# Patient Record
Sex: Female | Born: 1960 | Race: White | Hispanic: No | Marital: Married | Smoking: Former smoker
Health system: Southern US, Community
[De-identification: ages and names within clinical notes are randomized; demographics above are authoritative.]

## PROBLEM LIST (undated history)

## (undated) DIAGNOSIS — C4490 Unspecified malignant neoplasm of skin, unspecified: Secondary | ICD-10-CM

## (undated) DIAGNOSIS — E538 Deficiency of other specified B group vitamins: Secondary | ICD-10-CM

## (undated) DIAGNOSIS — B029 Zoster without complications: Secondary | ICD-10-CM

## (undated) DIAGNOSIS — M1990 Unspecified osteoarthritis, unspecified site: Secondary | ICD-10-CM

## (undated) DIAGNOSIS — Z9889 Other specified postprocedural states: Secondary | ICD-10-CM

## (undated) DIAGNOSIS — F419 Anxiety disorder, unspecified: Secondary | ICD-10-CM

## (undated) DIAGNOSIS — M519 Unspecified thoracic, thoracolumbar and lumbosacral intervertebral disc disorder: Secondary | ICD-10-CM

## (undated) DIAGNOSIS — K219 Gastro-esophageal reflux disease without esophagitis: Secondary | ICD-10-CM

## (undated) DIAGNOSIS — M4802 Spinal stenosis, cervical region: Secondary | ICD-10-CM

## (undated) DIAGNOSIS — C801 Malignant (primary) neoplasm, unspecified: Secondary | ICD-10-CM

## (undated) DIAGNOSIS — R112 Nausea with vomiting, unspecified: Secondary | ICD-10-CM

## (undated) DIAGNOSIS — E162 Hypoglycemia, unspecified: Secondary | ICD-10-CM

## (undated) DIAGNOSIS — K589 Irritable bowel syndrome without diarrhea: Secondary | ICD-10-CM

## (undated) DIAGNOSIS — N816 Rectocele: Secondary | ICD-10-CM

## (undated) DIAGNOSIS — Z8249 Family history of ischemic heart disease and other diseases of the circulatory system: Secondary | ICD-10-CM

## (undated) HISTORY — DX: Unspecified osteoarthritis, unspecified site: M19.90

## (undated) HISTORY — PX: DILATION AND CURETTAGE OF UTERUS: SHX78

## (undated) HISTORY — DX: Zoster without complications: B02.9

## (undated) HISTORY — DX: Spinal stenosis, cervical region: M48.02

## (undated) HISTORY — DX: Rectocele: N81.6

## (undated) HISTORY — PX: NASAL SINUS SURGERY: SHX719

## (undated) HISTORY — DX: Gastro-esophageal reflux disease without esophagitis: K21.9

## (undated) HISTORY — DX: Unspecified thoracic, thoracolumbar and lumbosacral intervertebral disc disorder: M51.9

## (undated) HISTORY — DX: Irritable bowel syndrome, unspecified: K58.9

## (undated) HISTORY — DX: Malignant (primary) neoplasm, unspecified: C80.1

## (undated) HISTORY — DX: Unspecified malignant neoplasm of skin, unspecified: C44.90

## (undated) HISTORY — DX: Family history of ischemic heart disease and other diseases of the circulatory system: Z82.49

## (undated) HISTORY — DX: Anxiety disorder, unspecified: F41.9

## (undated) HISTORY — PX: KNEE ARTHROSCOPY: SUR90

## (undated) HISTORY — PX: SHOULDER SURGERY: SHX246

## (undated) HISTORY — DX: Deficiency of other specified B group vitamins: E53.8

## (undated) HISTORY — PX: TUBAL LIGATION: SHX77

## (undated) HISTORY — PX: OTHER SURGICAL HISTORY: SHX169

---

## 1978-11-22 HISTORY — PX: TONSILLECTOMY: SUR1361

## 1990-11-22 HISTORY — PX: TOTAL LAPAROSCOPIC HYSTERECTOMY WITH BILATERAL SALPINGO OOPHORECTOMY: SHX6845

## 1990-11-22 HISTORY — PX: ABDOMINAL HYSTERECTOMY: SHX81

## 1990-11-22 HISTORY — PX: LAPAROSCOPIC HYSTERECTOMY: SHX1926

## 2002-11-22 HISTORY — PX: CHOLECYSTECTOMY: SHX55

## 2003-07-08 ENCOUNTER — Ambulatory Visit (HOSPITAL_COMMUNITY): Admission: RE | Admit: 2003-07-08 | Discharge: 2003-07-08 | Payer: Self-pay | Admitting: Internal Medicine

## 2003-07-08 ENCOUNTER — Encounter: Payer: Self-pay | Admitting: Internal Medicine

## 2003-07-08 HISTORY — PX: COLONOSCOPY: SHX174

## 2003-07-19 ENCOUNTER — Observation Stay (HOSPITAL_COMMUNITY): Admission: RE | Admit: 2003-07-19 | Discharge: 2003-07-20 | Payer: Self-pay | Admitting: General Surgery

## 2003-08-30 ENCOUNTER — Emergency Department (HOSPITAL_COMMUNITY): Admission: EM | Admit: 2003-08-30 | Discharge: 2003-08-31 | Payer: Self-pay | Admitting: Internal Medicine

## 2004-08-08 ENCOUNTER — Emergency Department (HOSPITAL_COMMUNITY): Admission: EM | Admit: 2004-08-08 | Discharge: 2004-08-08 | Payer: Self-pay

## 2004-11-06 ENCOUNTER — Ambulatory Visit (HOSPITAL_COMMUNITY): Admission: RE | Admit: 2004-11-06 | Discharge: 2004-11-06 | Payer: Self-pay | Admitting: Family Medicine

## 2005-08-30 ENCOUNTER — Emergency Department (HOSPITAL_COMMUNITY): Admission: EM | Admit: 2005-08-30 | Discharge: 2005-08-30 | Payer: Self-pay | Admitting: Emergency Medicine

## 2006-10-26 ENCOUNTER — Ambulatory Visit (HOSPITAL_COMMUNITY): Admission: RE | Admit: 2006-10-26 | Discharge: 2006-10-26 | Payer: Self-pay | Admitting: Obstetrics and Gynecology

## 2007-06-07 ENCOUNTER — Ambulatory Visit (HOSPITAL_COMMUNITY): Admission: RE | Admit: 2007-06-07 | Discharge: 2007-06-07 | Payer: Self-pay | Admitting: Family Medicine

## 2007-11-23 HISTORY — PX: CERVICAL SPINE SURGERY: SHX589

## 2008-02-12 ENCOUNTER — Ambulatory Visit (HOSPITAL_COMMUNITY): Admission: RE | Admit: 2008-02-12 | Discharge: 2008-02-12 | Payer: Self-pay | Admitting: Obstetrics and Gynecology

## 2008-04-01 ENCOUNTER — Other Ambulatory Visit: Admission: RE | Admit: 2008-04-01 | Discharge: 2008-04-01 | Payer: Self-pay | Admitting: Obstetrics and Gynecology

## 2008-04-24 ENCOUNTER — Ambulatory Visit (HOSPITAL_COMMUNITY): Admission: RE | Admit: 2008-04-24 | Discharge: 2008-04-24 | Payer: Self-pay | Admitting: Unknown Physician Specialty

## 2008-06-10 ENCOUNTER — Ambulatory Visit (HOSPITAL_COMMUNITY): Admission: RE | Admit: 2008-06-10 | Discharge: 2008-06-11 | Payer: Self-pay | Admitting: Neurosurgery

## 2008-07-15 ENCOUNTER — Ambulatory Visit: Payer: Self-pay | Admitting: Internal Medicine

## 2008-07-15 DIAGNOSIS — G4700 Insomnia, unspecified: Secondary | ICD-10-CM

## 2008-07-15 DIAGNOSIS — I83891 Varicose veins of right lower extremities with other complications: Secondary | ICD-10-CM

## 2008-07-15 DIAGNOSIS — K5900 Constipation, unspecified: Secondary | ICD-10-CM | POA: Insufficient documentation

## 2008-07-15 DIAGNOSIS — H3530 Unspecified macular degeneration: Secondary | ICD-10-CM | POA: Insufficient documentation

## 2008-07-15 DIAGNOSIS — IMO0001 Reserved for inherently not codable concepts without codable children: Secondary | ICD-10-CM

## 2008-07-15 DIAGNOSIS — M129 Arthropathy, unspecified: Secondary | ICD-10-CM | POA: Insufficient documentation

## 2008-07-15 DIAGNOSIS — M545 Low back pain: Secondary | ICD-10-CM

## 2008-07-15 DIAGNOSIS — Z87898 Personal history of other specified conditions: Secondary | ICD-10-CM

## 2008-07-15 DIAGNOSIS — N318 Other neuromuscular dysfunction of bladder: Secondary | ICD-10-CM

## 2008-07-15 DIAGNOSIS — E162 Hypoglycemia, unspecified: Secondary | ICD-10-CM | POA: Insufficient documentation

## 2008-07-15 DIAGNOSIS — N3946 Mixed incontinence: Secondary | ICD-10-CM | POA: Insufficient documentation

## 2008-07-15 DIAGNOSIS — K589 Irritable bowel syndrome without diarrhea: Secondary | ICD-10-CM

## 2008-07-15 DIAGNOSIS — J309 Allergic rhinitis, unspecified: Secondary | ICD-10-CM | POA: Insufficient documentation

## 2008-08-09 ENCOUNTER — Ambulatory Visit: Payer: Self-pay | Admitting: Internal Medicine

## 2008-08-09 DIAGNOSIS — J069 Acute upper respiratory infection, unspecified: Secondary | ICD-10-CM | POA: Insufficient documentation

## 2008-08-09 LAB — CONVERTED CEMR LAB: Inflenza A Ag: NEGATIVE

## 2008-08-29 ENCOUNTER — Encounter (INDEPENDENT_AMBULATORY_CARE_PROVIDER_SITE_OTHER): Payer: Self-pay | Admitting: Internal Medicine

## 2008-09-25 ENCOUNTER — Encounter (INDEPENDENT_AMBULATORY_CARE_PROVIDER_SITE_OTHER): Payer: Self-pay | Admitting: Internal Medicine

## 2008-10-25 ENCOUNTER — Ambulatory Visit (HOSPITAL_COMMUNITY): Admission: RE | Admit: 2008-10-25 | Discharge: 2008-10-25 | Payer: Self-pay | Admitting: Neurosurgery

## 2008-10-25 ENCOUNTER — Encounter (INDEPENDENT_AMBULATORY_CARE_PROVIDER_SITE_OTHER): Payer: Self-pay | Admitting: Internal Medicine

## 2008-11-08 ENCOUNTER — Encounter (INDEPENDENT_AMBULATORY_CARE_PROVIDER_SITE_OTHER): Payer: Self-pay | Admitting: Internal Medicine

## 2008-12-06 ENCOUNTER — Ambulatory Visit: Payer: Self-pay | Admitting: Internal Medicine

## 2008-12-06 DIAGNOSIS — R5381 Other malaise: Secondary | ICD-10-CM | POA: Insufficient documentation

## 2008-12-06 DIAGNOSIS — M542 Cervicalgia: Secondary | ICD-10-CM

## 2008-12-06 DIAGNOSIS — R5383 Other fatigue: Secondary | ICD-10-CM

## 2008-12-20 ENCOUNTER — Ambulatory Visit: Payer: Self-pay | Admitting: Internal Medicine

## 2008-12-20 DIAGNOSIS — G5600 Carpal tunnel syndrome, unspecified upper limb: Secondary | ICD-10-CM

## 2009-01-22 ENCOUNTER — Encounter
Admission: RE | Admit: 2009-01-22 | Discharge: 2009-04-22 | Payer: Self-pay | Admitting: Physical Medicine and Rehabilitation

## 2009-01-24 ENCOUNTER — Ambulatory Visit: Payer: Self-pay | Admitting: Physical Medicine and Rehabilitation

## 2009-01-29 ENCOUNTER — Encounter (HOSPITAL_COMMUNITY)
Admission: RE | Admit: 2009-01-29 | Discharge: 2009-02-28 | Payer: Self-pay | Admitting: Physical Medicine and Rehabilitation

## 2009-02-14 ENCOUNTER — Encounter (INDEPENDENT_AMBULATORY_CARE_PROVIDER_SITE_OTHER): Payer: Self-pay | Admitting: Internal Medicine

## 2009-02-14 ENCOUNTER — Other Ambulatory Visit: Admission: RE | Admit: 2009-02-14 | Discharge: 2009-02-14 | Payer: Self-pay | Admitting: Internal Medicine

## 2009-02-14 ENCOUNTER — Ambulatory Visit: Payer: Self-pay | Admitting: Internal Medicine

## 2009-02-21 ENCOUNTER — Ambulatory Visit (HOSPITAL_COMMUNITY): Admission: RE | Admit: 2009-02-21 | Discharge: 2009-02-21 | Payer: Self-pay

## 2009-02-28 ENCOUNTER — Ambulatory Visit: Payer: Self-pay | Admitting: Physical Medicine and Rehabilitation

## 2009-03-01 ENCOUNTER — Ambulatory Visit (HOSPITAL_COMMUNITY)
Admission: RE | Admit: 2009-03-01 | Discharge: 2009-03-01 | Payer: Self-pay | Admitting: Physical Medicine and Rehabilitation

## 2009-03-04 ENCOUNTER — Ambulatory Visit: Payer: Self-pay | Admitting: Physical Medicine & Rehabilitation

## 2009-03-04 ENCOUNTER — Encounter
Admission: RE | Admit: 2009-03-04 | Discharge: 2009-03-04 | Payer: Self-pay | Admitting: Physical Medicine & Rehabilitation

## 2009-03-07 ENCOUNTER — Encounter (INDEPENDENT_AMBULATORY_CARE_PROVIDER_SITE_OTHER): Payer: Self-pay | Admitting: Internal Medicine

## 2009-03-28 ENCOUNTER — Ambulatory Visit: Payer: Self-pay | Admitting: Physical Medicine and Rehabilitation

## 2009-04-04 ENCOUNTER — Ambulatory Visit (HOSPITAL_COMMUNITY): Admission: RE | Admit: 2009-04-04 | Discharge: 2009-04-04 | Payer: Self-pay | Admitting: Neurosurgery

## 2009-04-23 ENCOUNTER — Ambulatory Visit: Payer: Self-pay | Admitting: Internal Medicine

## 2009-06-04 ENCOUNTER — Encounter
Admission: RE | Admit: 2009-06-04 | Discharge: 2009-06-04 | Payer: Self-pay | Admitting: Physical Medicine and Rehabilitation

## 2009-06-09 ENCOUNTER — Emergency Department (HOSPITAL_COMMUNITY): Admission: EM | Admit: 2009-06-09 | Discharge: 2009-06-09 | Payer: Self-pay | Admitting: Emergency Medicine

## 2009-06-11 ENCOUNTER — Ambulatory Visit: Payer: Self-pay | Admitting: Internal Medicine

## 2009-06-11 DIAGNOSIS — I739 Peripheral vascular disease, unspecified: Secondary | ICD-10-CM | POA: Insufficient documentation

## 2009-06-12 ENCOUNTER — Encounter (INDEPENDENT_AMBULATORY_CARE_PROVIDER_SITE_OTHER): Payer: Self-pay | Admitting: Internal Medicine

## 2009-06-12 LAB — CONVERTED CEMR LAB
ALT: 31 units/L (ref 0–35)
AST: 25 units/L (ref 0–37)
Albumin: 4.5 g/dL (ref 3.5–5.2)
Alkaline Phosphatase: 72 units/L (ref 39–117)
Anti Nuclear Antibody(ANA): NEGATIVE
BUN: 9 mg/dL (ref 6–23)
Basophils Absolute: 0 10*3/uL (ref 0.0–0.1)
Basophils Relative: 1 % (ref 0–1)
CO2: 24 meq/L (ref 19–32)
CRP: 0.1 mg/dL (ref <0.6)
Calcium: 9.5 mg/dL (ref 8.4–10.5)
Chloride: 107 meq/L (ref 96–112)
Cholesterol: 195 mg/dL (ref 0–200)
Creatinine, Ser: 0.91 mg/dL (ref 0.40–1.20)
Eosinophils Absolute: 0.1 10*3/uL (ref 0.0–0.7)
Eosinophils Relative: 2 % (ref 0–5)
Glucose, Bld: 87 mg/dL (ref 70–99)
HCT: 40.1 % (ref 36.0–46.0)
HDL: 60 mg/dL (ref >39)
Hemoglobin: 14 g/dL (ref 12.0–15.0)
LDL Cholesterol: 87 mg/dL (ref 0–99)
Lymphocytes Relative: 39 % (ref 12–46)
Lymphs Abs: 1.9 10*3/uL (ref 0.7–4.0)
MCHC: 34.9 g/dL (ref 30.0–36.0)
MCV: 92.2 fL (ref 78.0–100.0)
Monocytes Absolute: 0.5 10*3/uL (ref 0.1–1.0)
Monocytes Relative: 9 % (ref 3–12)
Neutro Abs: 2.5 10*3/uL (ref 1.7–7.7)
Neutrophils Relative %: 50 % (ref 43–77)
Platelets: 240 10*3/uL (ref 150–400)
Potassium: 4 meq/L (ref 3.5–5.3)
RBC: 4.35 M/uL (ref 3.87–5.11)
RDW: 13 % (ref 11.5–15.5)
Sed Rate: 3 mm/hr (ref 0–22)
Sodium: 142 meq/L (ref 135–145)
Total Bilirubin: 0.4 mg/dL (ref 0.3–1.2)
Total CHOL/HDL Ratio: 3.3
Total Protein: 6.8 g/dL (ref 6.0–8.3)
Triglycerides: 238 mg/dL — ABNORMAL HIGH (ref <150)
VLDL: 48 mg/dL — ABNORMAL HIGH (ref 0–40)
WBC: 4.9 10*3/uL (ref 4.0–10.5)

## 2009-06-20 ENCOUNTER — Telehealth (INDEPENDENT_AMBULATORY_CARE_PROVIDER_SITE_OTHER): Payer: Self-pay | Admitting: Internal Medicine

## 2009-06-20 ENCOUNTER — Encounter: Admission: RE | Admit: 2009-06-20 | Discharge: 2009-06-20 | Payer: Self-pay | Admitting: Internal Medicine

## 2009-06-23 ENCOUNTER — Encounter (INDEPENDENT_AMBULATORY_CARE_PROVIDER_SITE_OTHER): Payer: Self-pay | Admitting: Internal Medicine

## 2009-06-30 ENCOUNTER — Encounter (INDEPENDENT_AMBULATORY_CARE_PROVIDER_SITE_OTHER): Payer: Self-pay | Admitting: Internal Medicine

## 2009-07-18 ENCOUNTER — Encounter (INDEPENDENT_AMBULATORY_CARE_PROVIDER_SITE_OTHER): Payer: Self-pay | Admitting: Internal Medicine

## 2009-08-06 ENCOUNTER — Encounter (INDEPENDENT_AMBULATORY_CARE_PROVIDER_SITE_OTHER): Payer: Self-pay | Admitting: Internal Medicine

## 2009-09-26 ENCOUNTER — Ambulatory Visit (HOSPITAL_COMMUNITY): Admission: RE | Admit: 2009-09-26 | Discharge: 2009-09-26 | Payer: Self-pay | Admitting: Family Medicine

## 2010-04-03 ENCOUNTER — Ambulatory Visit (HOSPITAL_COMMUNITY): Admission: RE | Admit: 2010-04-03 | Discharge: 2010-04-03 | Payer: Self-pay | Admitting: Family Medicine

## 2010-06-15 ENCOUNTER — Ambulatory Visit (HOSPITAL_COMMUNITY): Admission: RE | Admit: 2010-06-15 | Discharge: 2010-06-15 | Payer: Self-pay | Admitting: Urology

## 2010-12-13 ENCOUNTER — Encounter: Payer: Self-pay | Admitting: Family Medicine

## 2010-12-14 ENCOUNTER — Encounter: Payer: Self-pay | Admitting: Internal Medicine

## 2010-12-20 LAB — CONVERTED CEMR LAB
ALT: 15 units/L (ref 0–35)
AST: 16 units/L (ref 0–37)
Albumin: 4.5 g/dL (ref 3.5–5.2)
Alkaline Phosphatase: 63 units/L (ref 39–117)
BUN: 13 mg/dL (ref 6–23)
Basophils Absolute: 0 10*3/uL (ref 0.0–0.1)
Basophils Relative: 1 % (ref 0–1)
CO2: 21 meq/L (ref 19–32)
Calcium: 9.4 mg/dL (ref 8.4–10.5)
Chloride: 107 meq/L (ref 96–112)
Cholesterol: 188 mg/dL (ref 0–200)
Creatinine, Ser: 0.75 mg/dL (ref 0.40–1.20)
Eosinophils Absolute: 0.2 10*3/uL (ref 0.0–0.7)
Eosinophils Relative: 4 % (ref 0–5)
Free T4: 0.94 ng/dL (ref 0.89–1.80)
Glucose, Bld: 82 mg/dL (ref 70–99)
HCT: 42.2 % (ref 36.0–46.0)
HDL: 66 mg/dL (ref >39)
Hemoglobin: 14.3 g/dL (ref 12.0–15.0)
LDL Cholesterol: 108 mg/dL — ABNORMAL HIGH (ref 0–99)
Lymphocytes Relative: 33 % (ref 12–46)
Lymphs Abs: 1.7 10*3/uL (ref 0.7–4.0)
MCHC: 33.9 g/dL (ref 30.0–36.0)
MCV: 92.5 fL (ref 78.0–100.0)
Monocytes Absolute: 0.4 10*3/uL (ref 0.1–1.0)
Monocytes Relative: 7 % (ref 3–12)
Neutro Abs: 2.9 10*3/uL (ref 1.7–7.7)
Neutrophils Relative %: 56 % (ref 43–77)
Platelets: 233 10*3/uL (ref 150–400)
Potassium: 4.2 meq/L (ref 3.5–5.3)
RBC: 4.56 M/uL (ref 3.87–5.11)
RDW: 13 % (ref 11.5–15.5)
Sodium: 142 meq/L (ref 135–145)
TSH: 1.382 microintl units/mL (ref 0.350–4.50)
Total Bilirubin: 0.7 mg/dL (ref 0.3–1.2)
Total CHOL/HDL Ratio: 2.8
Total Protein: 7 g/dL (ref 6.0–8.3)
Triglycerides: 69 mg/dL (ref <150)
VLDL: 14 mg/dL (ref 0–40)
WBC: 5.1 10*3/uL (ref 4.0–10.5)

## 2011-04-06 NOTE — Group Therapy Note (Signed)
Natasha Wright is a pleasant 50 year old married woman who works 40-60  hours a week who has kindly been referred by Dr. Jen Mow.  Natasha Wright states  that she has multiple pain problems, which include neck pain, right hand  pain, right lateral hip pain, bilateral leg pain.   She states that she has also been diagnosed with fibromyalgia back in  2005.   She underwent a C5-C6 and C6-C7 extensive anterior cervical diskectomy  and decompression by Dr. Tressie Stalker on 05/2008.  And he she  continues to follow her as well.   With respect to her pain, she indicates her average pain is about 3 on a  scale of 10.  She describes her pain is intermittent.  Her biggest  problem is in the morning when she first gets up.  She stayed active  throughout the day and she is a little better, but driving exacerbates  her neck pain as well as her right hand pain.   Sleep tends to be poor; however, she does find that she sleeps much  better with trazodone.   Pain is typically worse with prolonged sitting, standing, inactivity,  improves with rest, medications, TENS unit, and injections.  She reports  a little-to-fair relief with current medications.   Medications which she takes at this time include trazodone 50 mg at  bedtime., estradiol, etodolac 400 mg 1-2 a day, and Tylenol 600 mg 2  tablets b.i.d.   FUNCTIONAL STATUS:  She is able to walk without assistance.  She can  walk at least 30 minutes at a time.  She climb stairs.  She is able to  drive.  She states that she puts on about 3000 miles per month on her  car.  She works 40-60 hours a week.  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   She is independent with self-care, overall, very high functioning  individual.   Denies problems controlling bowel or bladder, admits to occasional  numbness and tingling in the upper extremities, spasms, and occasional  dizziness.  Denies depression, anxiety.  Denies  suicidal ideation.   REVIEW OF SYSTEMS:  Also positive for occasional night sweats, easy  bleeding, and diarrhea.  She maintain contact with Dr. Jen Mow for her  primary care needs.   PAST MEDICAL HISTORY:  Positive for history of gallbladder problems  status post cholecystectomy, tonsillectomy, hysterectomy, septoplasty,  and cervical spine surgery, Dr. Lovell Sheehan C5-C6, C6-C7 fusion on June 18, 2008.   SOCIAL HISTORY:  The patient is married, lives with her husband and her  son who has Down syndrome 34 year old.  She denies legal substance use.  Denies alcohol use and she admits to smoking half pack of cigarettes a  day.  She was counseled to quit.   FAMILY HISTORY:  Positive for heart disease, diabetes, high blood  pressure.  Mother with bipolar disorder and sister with bipolar  disorder.   ALLERGIES:  MORPHINE causes itching and nausea, CODEINE causes itching  and nausea, SULFA rash and dyspnea, PENICILLIN rash and dyspnea, did not  tolerate FENTANYL, apparently dose too high.   PHYSICAL EXAMINATION:  Blood pressure is 130/71, pulse 84, respirations  18, 100% saturated on room air.  She is a well-developed, well-nourished  woman who does not appear in any distress.  She is oriented x3.  Speech  is clear.  Affect is bright.  She is alert, cooperative, and pleasant.  She follows commands  without difficulty.   Cranial nerves II-XII are grossly intact.  Coordination is intact.  Reflexes are 1+ in the upper extremities and symmetric 2+ in the lower  extremities, symmetric.  No abnormal tone is noted.  No clonus is noted.  No tremors are appreciated.  She has intact sensation to light touch,  pinprick, and vibratory sensation.   Motor strength is 5/5 in the upper extremities without focal deficits,  5/5 at hip flexors, knee extensors, dorsiflexors, and plantar flexors.  No focal deficits appreciated.   Transitioning from sitting to standing is done with ease.  Gait is not  antalgic.   Tandem gait and Romberg test are performed adequately.   She has limitations in cervical range of motion in all planes, rotation  to the left is 45 degrees, to the right is 60 degrees.  Forward flexion,  extension, and lateral flexion are all limited.  She has full shoulder  range of motion.  Right hand is tender at the carpometacarpal joint in  the right thumb.  She has a thoracolumbar curve at the apex of the  lumbosacral junction convexed of the right.  Range of motion is mildly  limited in all planes.  She has tenderness over the right trochanter  down the iliotibial band mildly so on the left as well.  Maisie Fus test is  negative.   Valgus deformity noted bilaterally at the metatarsophalangeal joints  bilaterally, tenderness in the cervical paraspinal as well as  parascapular muscles in the lumbar paraspinal musculature noted.   IMPRESSION:  1. Cervicalgia status post C5-C6, C6-C7 fusion Dr. Lovell Sheehan August 11, 2008.  2. Lumbago with history of mild scoliosis apex L2 convexed to the      right.  3. Right thumb carpometacarpal tenderness.  4. Right trochanteric bursitis.  5. History of fibromyalgia.  6. Occasional right hand numbness and tingling without obvious      deficits on exam today.   PLAN:  1. We will give her a prescription for a right hand orthosis to      stabilize right thumb carpometacarpal joint.  2. X-rays right hand.  3. EMG nerve conduction studies right bilateral upper extremities.  4. Physical therapy to address lower extremity strength and mobility      deficits addressing right trochanteric bursitis specifically      ultrasound along the IT band.  5. Trialed Lyrica 25 mg one p.o. q.p.m. for 7 days and 2 p.o. q.p.m.      for 7 days.   Risks and benefits of Lyrica reviewed with her.  She would like to trial  it.  She has been on it before, but believes the dose has been much  higher, and at that time, she did not tolerate the high dose, as she   states that it put her in a bad mood.  We will see her back in a month.           ______________________________  Brantley Stage, M.D.     DMK/MedQ  D:  01/24/2009 11:57:05  T:  01/25/2009 02:38:53  Job #:  [id number]   cc:   Erle Crocker, M.D.

## 2011-04-06 NOTE — Op Note (Signed)
NAME:  Natasha Wright, Natasha Wright                 ACCOUNT NO.:  [id number]   MEDICAL RECORD NO.:  [id number]          PATIENT TYPE:  OIB   LOCATION:  3524                         FACILITY:  [facility]   PHYSICIAN:  Cristi Loron, M.D.DATE OF BIRTH:  Mar 15, 1961   DATE OF PROCEDURE:  06/10/2008  DATE OF DISCHARGE:  06/11/2008                               OPERATIVE REPORT   BRIEF HISTORY:  The patient is a 50 year old white female who suffered  from neck and right arm pain, consistent with the cervical  radiculopathy.  She failed medical management, was worked up with  cervical MRI which demonstrated the patient had spondylosis and  stenosis, etc. at C5-C6 and C6-C7.  I discussed the various treatment  option with her including surgery.  The patient has weighed the risks,  benefits, and alternatives of surgery; and opted to proceed with the C5-  C6 and C6-C7 anterior cervical diskectomy, fusion, and plating.   PREOPERATIVE DIAGNOSES:  1. C5-C6 and C6-C7 herniated nucleus pulposus.  2. Spondylosis.  3. Stenosis.  4. Disk degeneration.  5. Cervical radiculopathy.  6. Status post myelopathy.  7. Cervicalgia.   POSTOPERATIVE DIAGNOSES:  1. C5-C6 and C6-C7 herniated nucleus pulposus.  2. Spondylosis.  3. Stenosis.  4. Disk degeneration.  5. Cervical radiculopathy.  6. Status post myelopathy.  7. Cervicalgia.   PROCEDURE:  C5-C6 and C6-C7 extensive anterior cervical  diskectomy/decompression; C5-C6 and C6-C7 anterior interbody arthrodesis  with local morselized autograft bone, as well as Actifuse bone graft  extenders; insertion of C5-C6 and C6-C7 interbody prosthesis (Alphatec  PEEK interbody prosthesis); anterior cervical plating C5-C7 with Codman  SLIM-LOC titanium plate and screws.   SURGEON:  Tressie Stalker, MD   ASSISTANT:  Hilda Lias, MD   ANESTHESIA:  General endotracheal.   ESTIMATED BLOOD LOSS:  100 mL.   SPECIMENS:  None.   DRAINS:  None.   COMPLICATIONS:  None.   DESCRIPTION OF PROCEDURE:  The patient was brought to the operating room  by the Anesthesia Team.  General endotracheal anesthesia was induced.  The patient remained in supine position.  A roll was placed under her  shoulders to place her neck in slight extension.  Her anterior cervical  region was then prepared with Betadine scrub and Betadine solution.  Sterile drapes were applied.  I then injected the area to be incised  with Marcaine and with epinephrine solution.  We then used a scalpel to  make a transverse incision in the patient's left anterior neck using  Metzenbaum scissors to divide the platysma muscle, and then dissect the  medial to sternocleidomastoid muscle, jugular vein, and carotid artery.   We carefully dissected down towards the anterior cervical spine  identifying the esophagus and retracted medially.  We then used Kitner  swabs to clear the soft tissue from the anterior cervical spine, and  then we inserted a bent spinal needle exposing the intervertebral disk  space.  We obtained intraoperative radiograph to confirm our location.   We then used electrocautery to detach the medial border of the longus  colli muscle bilaterally from the C5-C6  and C6-C7 intervertebral disk  space.  We inserted the Caspar self-retaining retractor underneath the  longus colli muscle bilaterally for providing exposure.  We then began  the decompression at C5-C6.  We incised C5-C6 intervertebral disk with a  15-blade scalpel.  The disk space was quite spondylotic and collapsed.  We performed partial discectomy using the pituitary forceps.  We then  inserted distraction screws at the C5-C6 vertebral bodies and the  distracted interspace.  We then used a high-speed drill to decorticate  the vertebral endplates at C5-C6, drilled away the remainder of the C5-  C6 intervertebral disks, drilled away some posterior spondylosis, and to  thin out the posterior longitudinal ligament.  We then incised  the  ligament with the arachnoid knife, and then removed it with a Kerrison  punch undercutting the vertebral endplates decompressing the thecal sac.  We then performed a foraminotomy about the C6 nerve root completing the  decompression at this level.   We then repeated this procedure in an analogous fashion at C6-C7,  decompressing the C6-C7, thecal sac and the bilateral C7 nerve root.   Having completed the decompression, we now turned our attention to the  arthrodesis.  We used trial spacers, and determined to use 5 mm medium  PEEK interbody prosthesis.  We prefilled these prosthesis with a  combination of local autograft bone obtained during the decompression,  as well as Actifuse bone-graft extender.  We then inserted a prosthesis,  distracted interspaces at C5-C6 and C6-C7, and then removed the  distraction screws.  There was a good snug fit of prosthesis at both  levels.  This completed the arthrodesis.   We now turned our attention to anterior spinal instrumentation using  high-speed drill to remove the ventral spondylosis at C5-C6 and C6-C7 so  that the plate would lay down flat.  We selected appropriate length  Codman SLIM-LOC anterior cervical plate and laid it along the anterior  aspect of the vertebral bodies using from C5-C7.  I then drilled two 12  mm holes at C5, C6, and C7 secured the plate to the vertebral bodies by  placing  two 12-mm self-tapping screws at C5, C6, and C7.  We then  obtained intraoperative radiograph.  It demonstrated good position of  the plate, screws and interbody prosthesis.  We therefore secured these  screws and plate by locking each cam.  We then irrigated the wound out  with bacitracin solution.  We obtained stringent hemostasis using  bipolar electrocautery.   We then removed the retractor.  We inspected the esophagus for any  damage and there was none apparent.  We then reapproximated the  patient's platysma muscle with interrupted 3-0  Vicryl suture,  subcutaneous tissue with interrupted 3-0 Vicryl suture, and the skin  with Steri-Strips and Benzoin.  The wound was then coated with  bacitracin ointment and sterile dressing applied.  The drapes were  removed.  The patient was subsequently extubated by the Anesthesia Team  and transported to the Postanesthesia Care Unit in stable condition.  All sponge, instrument, and needle counts were correct at the end of  this case.Cristi Loron, M.D.  Electronically Signed     JDJ/MEDQ  D:  06/10/2008  T:  06/11/2008  Job:  [id number]

## 2011-04-06 NOTE — Assessment & Plan Note (Signed)
[name] is a pleasant 50 year old high functioning married woman  who works for 40-60 hours a week.  She has been referred by Dr.  Erle Wright.  [name] has multiple pain complaints, which include  the following; a history of fibromyalgia dating back to 2005, chronic  neck pain, status post C5-6, C6-7 anterior cervical diskectomy  decompression, Tressie Stalker on July 2009, right lateral hip pain,  right hand pain, and left foot pain.   Last month, she was seen for the first time on January 24, 2009, and at  that time she was written out for a prescription.  She was given a  prescription for right hand orthosis to stabilize her right thumb  carpometacarpal joints.  She states that this has helped and she has  less pain and swelling in this hand.  She has electrodiagnostic studies  pending next week.   She also comments that she has been having difficulty extending her  fingers on the right hand completely.  She states that this has been a  problem, which was worse prior to surgery, but it still continues to be  somewhat of a problem for her.  She states that she has difficulty with  holding objects, she sometimes drop things, and feels that she does have  some hand weakness as well as pain in the area of the right thumb.   She was started on physical therapy to address her right trochanteric  bursitis and iliotibial band syndrome.  She reports overall improvement  in this region as well.  She has had a couple of physical therapy visits  and is doing a home program.  She states that the therapist are  interested in doing some ultrasound and would like to continue therapy  with her.   She did not get x-rays of her right hand last month as ordered, but she  would like to follow through this month.   She was started on Lyrica last month and has found that she is sleeping  much better and seems to help various fibromyalgic symptoms as well.   Her average pain in these various  areas is about 3 on a scale of 10.  Her sleep is fair.  Pain is worse with inactivity and improves with  rest, medication, and TENS unit.  She gets fair relief with current  medications.   FUNCTIONAL STATUS:  She is able to walk about 30 minutes at a time.  She  is able to climb stairs and drive.  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   REVIEW OF SYSTEMS:  Positive for numbness, spasms, and occasional  dizziness.  Denies bowel or bladder control problems, trouble walking,  or suicidal ideation.  Review of systems regarding constitutional, GI,  GU or cardiorespiratory are all negative.   Past medical, social, and family history are unchanged from previous  visit.   MEDICATIONS:  Trazodone and estradiol.  This is per Dr. Erle Wright  and she was started on some Lyrica through our clinic 25 mg initially 1  tablet nightly titrating to 2 tablets nightly.   PHYSICAL EXAMINATION:  VITAL SIGNS:  Today, blood pressure is 141/71,  pulse 90, respiration 18, and 100% saturated on room air.  GENERAL:  She is well-developed, well-nourished woman, who does not  appear in any distress.  NEUROLOGIC:  She is oriented x3.  Speech is clear.  Affect is bright.  She is alert, cooperative, and pleasant.  Follows  commands without  difficulty and answers questions appropriately.  Cranial nerves are  grossly intact.  She has 2+ reflexes at bilateral biceps, triceps,  brachioradialis, and 2+ at the patellar and Achilles tendons as well.  No abnormal tone is noted.  No clonus is noted.  No tremors are  appreciated.  EXTREMITIES:  She is noted to have a deficiency in extension of the  right proximal and distal phalanges.  She is able to extend them  volitionally.  She does have some weakness with these extensors,  however.  Finger flexors are slightly weaker on the right as well as  slight weakness in  right triceps is appreciated.  Rest of exam is  unremarkable.  She has full strength in left  upper extremity as well as  bilateral lower extremities.   Limitations in cervical range of motion are noted.  She has 45 degrees  of rotation to the left, about 60 degrees to the right.  Limitations in  extension are appreciated as well.  Full shoulder range of motion is  noted.  Lumbar flexion, provoke some right low back pain.  She has some  tenderness over the right trochanter and down the iliotibial band.  Left  MTP joints are tender, 1 through 5 with palpation.  She has a valgus  deformity at the first left MTP joint as well.  She has tenderness over  the right carpal and metacarpal joint on the right.   IMPRESSION:  1. Cervicalgia, status post C5-6, C6-7 anterior cervical diskectomy      decompression by Dr. Lovell Sheehan on July 2009.  2. Several year history of fibromyalgia.  3. 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  4. Left metatarsophalangeal tenderness with the first      metatarsophalangeal valgus deformity.  5. Right trochanteric bursitis and iliotibial band syndrome.  6. History of right upper extremity, numbness, tingling, weakness with      weakness noted in hand flexors as well as extensors.   PLAN:  1. Electrodiagnostic studies next week to include right upper      extremity EMG, nerve conduction studies to rule out carpal tunnel      and cervical radiculopathy, acute versus chronic.  2. Continue physical therapy to include ultrasound to the right      iliotibial band in trochanteric region on the right hip.  3. Right hand x-rays ordered last month were not completed.  We will      reorder again this month.  4. Discussion regarding the proper shoe wear for right foot MTP joint      tenderness that would recommend less flexible shoe wear, a more      supportive shoe for this foot.  She states she plans at some point      to have orthopedic evaluation and possibly surgery, but is not      quite ready yet.   The patient has done well with Lyrica.  She is  sleeping better, reports  slight improvement in overall pain.  We will continue this 50 mg at  night.   She has already made an appointment, follow up appointment with Dr.  Lovell Sheehan, status post her surgery last fall, and intends to see him on  March 07, 2009 as planned.  I will see her back in 1 month.  She did  well on the Lyrica without any significant side effects.  This was  refilled for her today and she was given 2 extra refills as well.  ______________________________  Brantley Stage, M.D.     DMK/MedQ  D:  02/28/2009 10:14:41  T:  02/28/2009 23:34:39  Job #:  [id number]   cc:   Natasha Wright, M.D.   Cristi Loron, M.D.  Fax: [phone]

## 2011-04-06 NOTE — Assessment & Plan Note (Signed)
[name] is a 50 year old high functioning married woman who  works 40-60 hours a week.  She was last seen by me on February 28, 2009.  She is a patient of Dr. Erle Crocker and has had cervical spine  surgery in July 2009 by Dr. Tressie Stalker.  At that time, she had a C5-  6 and C6-7 anterior cervical diskectomy.  She had an MRI of her cervical  spine without contrast on October 25, 2008, which showed small central  disk protrusion at C3-4 with facet degeneration, and also at the C4-5.  She had disk degeneration with diffuse uncinate spurring, mild stenosis  at that level was noted as well.   She recently followed up with Dr. Lovell Sheehan for continued neck pain and  upper extremity numbness.  Electrodiagnostic studies were ordered and  were completed by Dr. Wynn Banker on March 04, 2009.  At that time, there  was no electrodiagnostic evidence of median or ulnar neuropathy or of  active cervical radiculopathy.  Caveat noted.  Poor sensory involvement  due to cervical radiculopathy.  We will not have EMG abnormalities.   Results of this study were discussed with her today.   She states she did see Dr. Lovell Sheehan on February 25, 2009, and he had ordered  a cervical myelogram, which is to be completed in the next week or so.   Average pain is about 4 on a scale of 10, predominantly located in the  cervical region and radiating to the shoulders bilaterally.  She also  had some low back pain located on the right.  She is known to have a  right trochanteric bursitis and intermittent thumb pain as well, which  is felt to be joint related with negative hand x-rays recently on March 01, 2009 for significant arthritic findings.   Medications through this clinic include:  1. Lyrica 25 mg 2 tablets at night.  2. She occasionally takes a Tylenol on a p.r.n. basis and uses      trazodone at night to help her rest.   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  She is  independent with self-care including higher level household  tasks.  She is able to walk 60 minutes at a time.  She is able to climb  stairs and drive.   Denies problems controlling bowel or bladder.  Denies depression,  anxiety, or suicidal ideation.   REVIEW OF SYSTEMS:  Otherwise, noncontributory.   No changes in her past medical, social, or family history since last  visit.   PHYSICAL EXAMINATION:  VITAL SIGNS:  Today, blood pressure is 108/64,  pulse 86, respiration 18, and 100% saturation on room air.  GENERAL:  She is well-developed, well-nourished female, who appears her  stated age and does not appear in any distress.  She is oriented x3.  Speech is clear.  Affect is bright.  She is alert, cooperative,  pleasant.  Follows commands without difficulty, answers questions  appropriately.  NEUROLOGIC:  Cranial nerves II through XII are intact.  Coordination is  intact.  Reflexes are 1+ in the upper extremities, biceps, triceps, and  brachioradialis and 1+ in the lower extremities at patellar and Achilles  tendons.  No abnormal tone is noted.  No clonus is noted.   Limitations are noted in cervical range of motion especially with  rotation to the left is about 25 degrees and to the right is about 40  degrees, also limited  in extension and flexion.  She has full shoulder  range of motion.  She has some decreased sensation over the right C5  dermatome and into the right C6 dermatome somewhat as well.  Motor  strength, however, is in the 5/5 range without obvious focal weakness  including shoulder external rotators and shoulder abduction.   Mild limitations noted in lumbar motion.   Gait is normal.  Tandem gait and Romberg test are all performed  adequately.   Examination of her hands reveals tenderness over the bilateral CMC  joints worse on the right than on the left, tender with palpation here.   IMPRESSION:  1. Cervicalgia, status post C5-6 and C6-7 anterior cervical  diskectomy      decompression, Dr. Lovell Sheehan, July 2009.  2. History of fibromyalgia.  3. Right carpometacarpal joint tenderness with negative x-ray at this      time.  4. Right trochanteric bursitis, iliotibial band syndrome per exam last      visit.  5. History of right upper extremity numbness, tingling with negative      EMG for active radiculopathy or evidence of ulnar or median nerve      neuropathy.   PLAN:  She does not need a refill on her Lyrica today.  She has been  tolerating that well.  She takes it in the evening and finds to be quite  helpful.  We will add Ultracet today 1 p.o. q.12 h. q.i.d. #120, no  refills.   Encouraged follow up with Dr. Lovell Sheehan.  Apparently, she has a myelogram  planned next week.   Regarding hand pain, we consider occupational therapy in the upcoming  months for her joint protection techniques.  I will see her back in a  month.           ______________________________  Brantley Stage, M.D.     DMK/MedQ  D:  03/28/2009 11:35:06  T:  03/29/2009 01:19:09  Job #:  [id number]   cc:   Erle Crocker, M.D.   Cristi Loron, M.D.  Fax: [phone]

## 2011-04-09 NOTE — Op Note (Signed)
NAME:  Natasha Wright, Natasha Wright                           ACCOUNT NO.:  [id number]   MEDICAL RECORD NO.:  [id number]                   PATIENT TYPE:  AMB   LOCATION:  RAD                                  FACILITY:  [facility]   PHYSICIAN:  R. Roetta Sessions, M.D.              DATE OF BIRTH:  07-04-1961   DATE OF PROCEDURE:  07/08/2003  DATE OF DISCHARGE:                                 OPERATIVE REPORT   PROCEDURE:  Colonoscopy with ileoscopy.   INDICATION FOR PROCEDURE:  The patient is a 50 year old lady with  intermittent diarrhea punctuated with periods of constipation and occasional  blood per rectum.  Colonoscopy is now being done to further evaluate her  symptoms.  It is not able she has derived a significant improvement taking  one whole Levbid at bedtime, although it has caused her sedation the  following day.  Colonoscopy is now being done to further evaluate her bowel  symptoms.  The approach has been discussed with the patient at length.  The  potential risks, benefits, and alternatives have been reviewed, questions  answered, and she is agreeable.  Please see my dictated office note for more  information.   PROCEDURE NOTE:  O2 saturation, blood pressure, pulse, and respirations were  monitored throughout the entire procedure.   CONSCIOUS SEDATION:  Versed 5 mg IV, Demerol 100 mg IV in divided doses.   INSTRUMENT USED:  Olympus video chip pediatric colonoscopy.   FINDINGS:  Digital rectal exam revealed no abnormalities.   Endoscopic findings:  The prep was adequate.   Rectum:  Examination of the rectal mucosa, including retroflexed view of the  anal verge and en face view of the anal canal, revealed minimal probable  internal hemorrhoids.   Colon:  The colonic mucosa was surveyed from the rectosigmoid junction,  left, transverse, and right colon, to the area of the appendiceal orifice,  ileocecal valve, and cecum.  These structures were well-seen and  photographed.  The patient  had scattered left-sided diverticula.  The  remainder of the colonic mucosa to the cecum appeared normal.  The terminal  ileum was intubated to 10 cm.  This segment of the GI tract appeared  entirely normal.  From this level the scope was slowly withdrawn.  All  previously-mentioned mucosal surfaces were again seen and no other  abnormalities were observed.  The patient tolerated the procedure well and  was reactive in endoscopy.   IMPRESSION:  1. Minimally friable internal hemorrhoids, otherwise normal rectum.  2. Left-sided diverticula, remainder of colonic mucosa and terminal ileum     appeared normal.   I suspect we are dealing with irritable bowel syndrome with intermittent  blood from benign anorectal source (i.e., hemorrhoids).    RECOMMENDATIONS:  1. Hemorrhoid literature given to [name].  2. Decrease Levbid to one-half tablet at bedtime.  3. Diverticulosis literature provided to [name].  4. A  10-day course of Anusol-HC suppositories one per rectum at bedtime.  5. Follow-up appointment with [place] in one month.                                               Jonathon Bellows, M.D.    RMR/MEDQ  D:  07/08/2003  T:  07/08/2003  Job:  [id number]

## 2011-04-09 NOTE — Op Note (Signed)
NAME:  Natasha Wright, Natasha Wright                           ACCOUNT NO.:  [id number]   MEDICAL RECORD NO.:  [id number]                   PATIENT TYPE:  OBV   LOCATION:  A325                                 FACILITY:  [facility]   PHYSICIAN:  Barbaraann Barthel, M.D.              DATE OF BIRTH:  1961-02-05   DATE OF PROCEDURE:  DATE OF DISCHARGE:                                 OPERATIVE REPORT   PREOPERATIVE DIAGNOSIS:  Cholecystitis secondary to cholelithiasis versus  gallbladder polyps.   POSTOPERATIVE DIAGNOSIS:  Cholecystitis secondary to cholelithiasis versus  gallbladder polyps.   PROCEDURE:  Laparoscopic cholecystectomy.   SURGEON:  Marlane Hatcher, M.D.   SPECIMEN:  Gallbladder   NOTE:  This is a 50 year old white female who had at least a 6 months  history of postprandial right upper quadrant pain radiating to her back.  Sonogram revealed what was either embedded stones or polyps within it.  She  was referred to me for laparoscopic cholecystectomy.  We discussed the  procedure in detail with her; discussing complications, not limited to but  including: Bleeding, infection, damage to bile ducts, and perforation of  organs and informed consent was obtained.   GROSS OPERATIVE FINDINGS:  The patient had a very floppy gallbladder, a  small cystic duct which was not cannulated.  The right upper quadrant,  otherwise, appeared to be normal other than she appeared to have a soft  tissue indentation above her liver or at the area of her right costal margin  which maybe just a musculoskeletal deformity. No intra-abdominal masses were  appreciated.  She had moderate adhesions around the gallbladder.  The  gallbladder was not opened so I am not sure whether she had stones or polyps  within it.  Final pathology is pending there.   TECHNIQUE:  The patient was placed in the supine position and after the  adequate administration of general anesthesia by endotracheal intubation her  entire abdomen  was prepped with Betadine solution and draped in the usual  manner.  A Foley catheter was aseptically inserted.  A periumbilical  incision was carried out over the superior aspect of the umbilicus and the  fascia was grasped with a sharp towel clip and elevated and a Veress needle  was inserted and confirmed in position with a saline drop test.   The abdomen was then insufflated to approximately 2.4 cm of CO2.  Then using  the Visiport technique an 11-mm [place] Surgical cannula was placed in the  umbilicus and then under direct vision 3 other cannulas were placed and an  11-mm cannula in the epigastrium and two 5-mm cannulas in right upper  quadrant laterally.   The gallbladder was grasped.  Its adhesions were taken down.  The cystic  duct was clearly visualized and triply silver clipped and divided as was the  cystic artery. The gallbladder was then removed uneventfully using the hook  cautery device from the liver bed.  This was done with minimal oozing and  without problem.  The gallbladder was then retrieve and removed using the  EndoCatch device.  Then, after checking for hemostasis and lightly  cauterizing the liver bed and irrigating the abdomen was then desufflated.  I closed the fascia in the area of the umbilicus with #0 Polysorb sutures;  using 1/2% Sensorcaine in all the incision sites to add to postoperative  comfort.  The wounds were then closed with a stapling device.   Prior to closure, all sponge, needle, and instrument counts were found to be  correct.  Estimated blood loss was minimal.  The patient received 1500 cc of  crystalloids intraoperatively.  No drains were placed.  There were no  complications.                                               Barbaraann Barthel, M.D.    WB/MEDQ  D:  07/19/2003  T:  07/19/2003  Job:  [id number]   cc:   Georgann Housekeeper, M.D.  [address]  [place]  [place] [address]  Fax: [phone]   R. Roetta Sessions, M.D.  [address]  [place]  [place] [address]  Fax: [phone]

## 2011-08-20 LAB — CBC
HCT: 39.7
Platelets: 250
RBC: 4.16
WBC: 5.1

## 2011-09-09 ENCOUNTER — Other Ambulatory Visit (HOSPITAL_COMMUNITY): Payer: Self-pay | Admitting: Internal Medicine

## 2011-09-09 DIAGNOSIS — Z139 Encounter for screening, unspecified: Secondary | ICD-10-CM

## 2011-09-13 ENCOUNTER — Ambulatory Visit (HOSPITAL_COMMUNITY)
Admission: RE | Admit: 2011-09-13 | Discharge: 2011-09-13 | Disposition: A | Discharge status: Home / Self Care | Payer: Managed Care, Other (non HMO) | Source: Ambulatory Visit | Attending: Internal Medicine | Admitting: Internal Medicine

## 2011-09-13 DIAGNOSIS — Z139 Encounter for screening, unspecified: Secondary | ICD-10-CM

## 2011-09-13 DIAGNOSIS — Z1231 Encounter for screening mammogram for malignant neoplasm of breast: Secondary | ICD-10-CM | POA: Insufficient documentation

## 2011-09-17 ENCOUNTER — Other Ambulatory Visit: Payer: Self-pay | Admitting: Internal Medicine

## 2011-09-17 DIAGNOSIS — R928 Other abnormal and inconclusive findings on diagnostic imaging of breast: Secondary | ICD-10-CM

## 2011-09-29 ENCOUNTER — Ambulatory Visit
Admission: RE | Admit: 2011-09-29 | Discharge: 2011-09-29 | Disposition: A | Discharge status: Home / Self Care | Payer: Managed Care, Other (non HMO) | Source: Ambulatory Visit | Attending: Internal Medicine | Admitting: Internal Medicine

## 2011-09-29 DIAGNOSIS — R928 Other abnormal and inconclusive findings on diagnostic imaging of breast: Secondary | ICD-10-CM

## 2011-10-23 HISTORY — PX: NM MYOCAR PERF WALL MOTION: HXRAD629

## 2012-06-19 ENCOUNTER — Other Ambulatory Visit: Payer: Self-pay | Admitting: Orthopaedic Surgery

## 2012-06-19 DIAGNOSIS — M549 Dorsalgia, unspecified: Secondary | ICD-10-CM

## 2012-06-27 ENCOUNTER — Other Ambulatory Visit: Payer: Managed Care, Other (non HMO)

## 2012-07-11 ENCOUNTER — Ambulatory Visit
Admission: RE | Admit: 2012-07-11 | Discharge: 2012-07-11 | Disposition: A | Discharge status: Home / Self Care | Payer: Managed Care, Other (non HMO) | Source: Ambulatory Visit | Attending: Orthopaedic Surgery | Admitting: Orthopaedic Surgery

## 2012-07-11 DIAGNOSIS — M549 Dorsalgia, unspecified: Secondary | ICD-10-CM

## 2012-09-22 ENCOUNTER — Other Ambulatory Visit: Payer: Self-pay | Admitting: Internal Medicine

## 2012-09-22 DIAGNOSIS — Z1231 Encounter for screening mammogram for malignant neoplasm of breast: Secondary | ICD-10-CM

## 2012-10-31 ENCOUNTER — Ambulatory Visit
Admission: RE | Admit: 2012-10-31 | Discharge: 2012-10-31 | Disposition: A | Discharge status: Home / Self Care | Payer: Medicare HMO | Source: Ambulatory Visit | Attending: Internal Medicine | Admitting: Internal Medicine

## 2012-10-31 DIAGNOSIS — Z1231 Encounter for screening mammogram for malignant neoplasm of breast: Secondary | ICD-10-CM

## 2012-11-02 ENCOUNTER — Other Ambulatory Visit: Payer: Self-pay | Admitting: Physical Medicine and Rehabilitation

## 2012-11-02 DIAGNOSIS — M542 Cervicalgia: Secondary | ICD-10-CM

## 2012-11-02 DIAGNOSIS — M25559 Pain in unspecified hip: Secondary | ICD-10-CM

## 2012-11-08 ENCOUNTER — Ambulatory Visit
Admission: RE | Admit: 2012-11-08 | Discharge: 2012-11-08 | Disposition: A | Discharge status: Home / Self Care | Payer: Medicare HMO | Source: Ambulatory Visit | Attending: Physical Medicine and Rehabilitation | Admitting: Physical Medicine and Rehabilitation

## 2012-11-08 ENCOUNTER — Other Ambulatory Visit: Payer: Self-pay | Admitting: Physical Medicine and Rehabilitation

## 2012-11-08 DIAGNOSIS — M542 Cervicalgia: Secondary | ICD-10-CM

## 2012-11-08 DIAGNOSIS — M25559 Pain in unspecified hip: Secondary | ICD-10-CM

## 2013-04-17 ENCOUNTER — Encounter: Payer: Self-pay | Admitting: Internal Medicine

## 2013-04-18 ENCOUNTER — Encounter: Payer: Self-pay | Admitting: Gastroenterology

## 2013-04-18 ENCOUNTER — Ambulatory Visit (INDEPENDENT_AMBULATORY_CARE_PROVIDER_SITE_OTHER): Payer: Medicare HMO | Admitting: Gastroenterology

## 2013-04-18 VITALS — BP 118/73 | HR 91 | Temp 97.6°F | Ht 68.0 in | Wt 166.8 lb

## 2013-04-18 DIAGNOSIS — R140 Abdominal distension (gaseous): Secondary | ICD-10-CM | POA: Insufficient documentation

## 2013-04-18 DIAGNOSIS — R142 Eructation: Secondary | ICD-10-CM

## 2013-04-18 DIAGNOSIS — K589 Irritable bowel syndrome without diarrhea: Secondary | ICD-10-CM

## 2013-04-18 MED ORDER — PROMETHAZINE HCL 25 MG PO TABS: 25.0000 mg | ORAL_TABLET | Freq: Four times a day (QID) | ORAL | Status: DC | PRN

## 2013-04-18 MED ORDER — PEG 3350-KCL-NA BICARB-NACL 420 G PO SOLR: 4000.0000 mL | ORAL | Status: DC

## 2013-04-18 NOTE — Assessment & Plan Note (Signed)
52 year old lady with history of irritable bowel syndrome who presents with complaints of lower abdominal pain associated with severe abdominal bloating, incomplete evacuation of stool, concerns for history of diverticulosis. She describes lactose intolerance. Takes Bentyl for periods of increased fecal urgency with good results. She is due for 10 year followup colonoscopy. Plan on colonoscopy in the near future. She would like Dr. Darrick Penna to do her procedure.  I have discussed the risks, alternatives, benefits with regards to but not limited to the risk of reaction to medication, bleeding, infection, perforation and the patient is agreeable to proceed. Written consent to be obtained.  Consider hydrogen breath test after colonoscopy if appropriate based on findings. Checked for celiac disease with serologies. Continue Bentyl when necessary. Dairy avoidance. History B12 deficiency, patient plans for followup labs in the near future with her PCP.

## 2013-04-18 NOTE — Progress Notes (Signed)
Primary Care Physician:  Cassell Smiles., MD  Primary Gastroenterologist:  Jonette Eva, MD   Chief Complaint  Patient presents with  . Colonoscopy    HPI:  Natasha Wright is a 52 y.o. female here for further evaluation of chronic abdominal bloating, irritable bowel symptoms, history of diverticulosis. The patient was last seen at time of colonoscopy back in 2004 which was done for rectal bleeding. She had minimal friable internal hemorrhoids, left-sided diverticula at the time. Terminal ileum appeared normal. It was felt that she had IBS and benign anorectal bleeding from hemorrhoids. Procedure by Dr. Jena Gauss. Patient request Dr. Darrick Penna at this time given that she takes care of her son.   Complains of incomplete evacuation, one bm daily. Stool either solid or diarrhea. No constipation. Yellow stool sometimes with  black spots. Bloating no matter what she eats. Tends to be worse with dairy products. Takes Gas-X all the time. Cannot pass flatus. Lactose intolerance, diarrhea. Bentyl couple of times per week. Pain starts in the lower abdomen and radiates into her back. No heartburn or dysphagia. Bentyl works better than Imodium. Complains of feeling of pressure in the rectum which wakes her up at night. Lasts for one hour. Feels like she needs to pass a bowling ball but she does not have a stool. Denies any urinary symptoms. Denies heartburn, dysphagia, vomiting. Notes that she cannot take any type of NSAIDs because it "tears my stomach up". Recently had a Toradol shot which caused diarrhea. Takes steroid shots for severe arthritis. Cannot take morphine/codeine/nsaids. She has chronic neck and back pain. She has multiple joints within both hands better did. It. She has a small anterior superior quadrant labral tear of the right hip. She plans to get tested for your rheumatoid arthritis and gout in near future.    Current Outpatient Prescriptions  Medication Sig Dispense Refill  . diazepam (VALIUM) 10  MG tablet Take 10 mg by mouth every 6 (six) hours as needed for anxiety.      . dicyclomine (BENTYL) 10 MG capsule Take 10 mg by mouth 4 (four) times daily -  before meals and at bedtime.      [name] estradiol (ESTRACE) 0.5 MG tablet Take 0.5 mg by mouth daily.      . polyethylene glycol-electrolytes (TRILYTE) 420 G solution Take 4,000 mLs by mouth as directed.  4000 mL  0  . promethazine (PHENERGAN) 25 MG tablet Take 1 tablet (25 mg total) by mouth every 6 (six) hours as needed for nausea.  20 tablet  0   No current facility-administered medications for this visit.    Allergies as of 04/18/2013 - Review Complete 04/18/2013  Allergen Reaction Noted  . Codeine    . Fentanyl    . Morphine    . Penicillins    . Sulfamethoxazole w-trimethoprim      Past Medical History  Diagnosis Date  . IBS (irritable bowel syndrome)   . Spinal stenosis in cervical region   . Lumbar disc disease   . B12 deficiency     h/o    Past Surgical History  Procedure Laterality Date  . Colonoscopy  07/08/2003    RMR: Left-sided diverticula, remainder of colonic mucosa and terminal ileum appeared normal.. Minimally friable internal hemorrhoids, otherwise normal rectum  . Abdominal hysterectomy  1991  . Cholecystectomy  2004    stones/polyps  . Cervical spine surgery  2009    Family History  Problem Relation Age of Onset  . Colon cancer Neg Hx   .  Ulcerative colitis Father     History   Social History  . Marital Status: Married    Spouse Name: N/A    Number of Children: 1  . Years of Education: N/A   Occupational History  .     Social History Main Topics  . Smoking status: Current Some Day Smoker -- 0.20 packs/day    Types: Cigarettes  . Smokeless tobacco: Not on file  . Alcohol Use: No  . Drug Use: No  . Sexually Active: Not on file   Other Topics Concern  . Not on file   Social History Narrative   One son, Down syndrome      ROS:  General: Negative for anorexia, weight loss,  fever, chills, fatigue, weakness. Eyes: Negative for vision changes.  ENT: Negative for hoarseness, difficulty swallowing , nasal congestion. CV: Negative for chest pain, angina, palpitations, dyspnea on exertion, peripheral edema.  Respiratory: Negative for dyspnea at rest, dyspnea on exertion, cough, sputum, wheezing.  GI: See history of present illness. GU:  Negative for dysuria, hematuria, urinary incontinence, urinary frequency, nocturnal urination.  MS: Chronic joint pain, low back pain.  Derm: Negative for rash or itching.  Neuro: Negative for weakness, abnormal sensation, seizure, frequent headaches, memory loss, confusion.  Psych: Negative for anxiety, depression, suicidal ideation, hallucinations. Positive stress Endo: Negative for unusual weight change.  Heme: Negative for bruising or bleeding. Allergy: Negative for rash or hives.    Physical Examination:  BP 118/73  Pulse 91  Temp(Src) 97.6 F (36.4 C) (Oral)  Ht 5\' 8"  (1.727 m)  Wt 166 lb 12.8 oz (75.66 kg)  BMI 25.37 kg/m2   General: Well-nourished, well-developed in no acute distress.  Head: Normocephalic, atraumatic.   Eyes: Conjunctiva pink, no icterus. Mouth: Oropharyngeal mucosa moist and pink , no lesions erythema or exudate. Neck: Supple without thyromegaly, masses, or lymphadenopathy.  Lungs: Clear to auscultation bilaterally.  Heart: Regular rate and rhythm, no murmurs rubs or gallops.  Abdomen: Bowel sounds are normal, nontender, nondistended, no hepatosplenomegaly or masses, no abdominal bruits or    hernia , no rebound or guarding.   Rectal: Deferred Extremities: No lower extremity edema. No clubbing or deformities.  Neuro: Alert and oriented x 4 , grossly normal neurologically.  Skin: Warm and dry, no rash or jaundice.   Psych: Alert and cooperative, normal mood and affect.

## 2013-04-18 NOTE — Patient Instructions (Addendum)
1. Colonoscopy is scheduled. 2. Please have your blood work done. 3. Prescription for Phenergan since your pharmacy to take during your bowel prep if needed.

## 2013-04-18 NOTE — Progress Notes (Signed)
Cc PCP 

## 2013-04-23 ENCOUNTER — Encounter (HOSPITAL_COMMUNITY): Payer: Self-pay | Admitting: Pharmacy Technician

## 2013-04-25 LAB — TISSUE TRANSGLUTAMINASE, IGA: Tissue Transglutaminase Ab, IgA: 2.9 U/mL (ref <20)

## 2013-04-30 ENCOUNTER — Ambulatory Visit (HOSPITAL_COMMUNITY)
Admission: RE | Admit: 2013-04-30 | Discharge: 2013-04-30 | Disposition: A | Discharge status: Home / Self Care | Payer: Managed Care, Other (non HMO) | Source: Ambulatory Visit | Attending: Gastroenterology | Admitting: Gastroenterology

## 2013-04-30 ENCOUNTER — Encounter (HOSPITAL_COMMUNITY)
Admission: RE | Disposition: A | Discharge status: Home / Self Care | Payer: Self-pay | Source: Ambulatory Visit | Attending: Gastroenterology

## 2013-04-30 ENCOUNTER — Encounter (HOSPITAL_COMMUNITY): Payer: Self-pay | Admitting: *Deleted

## 2013-04-30 DIAGNOSIS — K5730 Diverticulosis of large intestine without perforation or abscess without bleeding: Secondary | ICD-10-CM

## 2013-04-30 DIAGNOSIS — K648 Other hemorrhoids: Secondary | ICD-10-CM | POA: Insufficient documentation

## 2013-04-30 DIAGNOSIS — R197 Diarrhea, unspecified: Secondary | ICD-10-CM

## 2013-04-30 DIAGNOSIS — R140 Abdominal distension (gaseous): Secondary | ICD-10-CM

## 2013-04-30 DIAGNOSIS — K589 Irritable bowel syndrome without diarrhea: Secondary | ICD-10-CM | POA: Insufficient documentation

## 2013-04-30 HISTORY — DX: Other specified postprocedural states: R11.2

## 2013-04-30 HISTORY — DX: Hypoglycemia, unspecified: E16.2

## 2013-04-30 HISTORY — DX: Other specified postprocedural states: Z98.890

## 2013-04-30 HISTORY — DX: Nausea with vomiting, unspecified: R11.2

## 2013-04-30 HISTORY — PX: COLONOSCOPY: SHX5424

## 2013-04-30 SURGERY — COLONOSCOPY
Anesthesia: Moderate Sedation

## 2013-04-30 MED ORDER — MIDAZOLAM HCL 5 MG/5ML IJ SOLN
INTRAMUSCULAR | Status: DC | PRN
  Administered 2013-04-30: 1 mg via INTRAVENOUS
  Administered 2013-04-30 (×2): 2 mg via INTRAVENOUS

## 2013-04-30 MED ORDER — MEPERIDINE HCL 100 MG/ML IJ SOLN
INTRAMUSCULAR | Status: DC | PRN
  Administered 2013-04-30: 25 mg via INTRAVENOUS
  Administered 2013-04-30: 50 mg via INTRAVENOUS

## 2013-04-30 MED ORDER — SIMETHICONE 40 MG/0.6ML PO SUSP
ORAL | Status: DC | PRN
  Administered 2013-04-30: 14:00:00

## 2013-04-30 MED ORDER — SODIUM CHLORIDE 0.9 % IJ SOLN
INTRAMUSCULAR | Status: AC
  Filled 2013-04-30: qty 10

## 2013-04-30 MED ORDER — MIDAZOLAM HCL 5 MG/5ML IJ SOLN
INTRAMUSCULAR | Status: AC
  Filled 2013-04-30: qty 10

## 2013-04-30 MED ORDER — PROMETHAZINE HCL 25 MG/ML IJ SOLN
INTRAMUSCULAR | Status: AC
  Filled 2013-04-30: qty 1

## 2013-04-30 MED ORDER — PROMETHAZINE HCL 25 MG/ML IJ SOLN
12.5000 mg | Freq: Once | INTRAMUSCULAR | Status: AC
  Administered 2013-04-30: 12.5 mg via INTRAVENOUS

## 2013-04-30 MED ORDER — MEPERIDINE HCL 100 MG/ML IJ SOLN
INTRAMUSCULAR | Status: AC
  Filled 2013-04-30: qty 1

## 2013-04-30 MED ORDER — SODIUM CHLORIDE 0.9 % IV SOLN
INTRAVENOUS | Status: DC
  Administered 2013-04-30: 14:00:00 via INTRAVENOUS

## 2013-04-30 NOTE — H&P (Signed)
  Primary Care Physician:  Cassell Smiles., MD Primary Gastroenterologist:  Dr. Darrick Penna  Pre-Procedure History & Physical: HPI:  Natasha Wright is a 52 y.o. female here for COLON CANCER SCREENING.  Past Medical History  Diagnosis Date  . IBS (irritable bowel syndrome)   . Spinal stenosis in cervical region   . Lumbar disc disease   . B12 deficiency     h/o  . Hypoglycemia   . PONV (postoperative nausea and vomiting)     Past Surgical History  Procedure Laterality Date  . Colonoscopy  07/08/2003    RMR: Left-sided diverticula, remainder of colonic mucosa and terminal ileum appeared normal.. Minimally friable internal hemorrhoids, otherwise normal rectum  . Abdominal hysterectomy  1991  . Cholecystectomy  2004    stones/polyps  . Cervical spine surgery  2009    Prior to Admission medications   Medication Sig Start Date End Date Taking? Authorizing Provider  diazepam (VALIUM) 10 MG tablet Take 10 mg by mouth every 6 (six) hours as needed (back pain).    Yes Historical Provider, MD  dicyclomine (BENTYL) 10 MG capsule Take 10 mg by mouth daily as needed (for irritable bowel syndrome).    Yes Historical Provider, MD  estradiol (ESTRACE) 0.5 MG tablet Take 0.5 mg by mouth daily.   Yes Historical Provider, MD  fexofenadine (ALLEGRA) 180 MG tablet Take 180 mg by mouth daily.   Yes Historical Provider, MD  Multiple Vitamins-Minerals (MULTIVITAMINS THER. W/MINERALS) TABS Take 1 tablet by mouth daily.   Yes Historical Provider, MD  OVER THE COUNTER MEDICATION Take 1 tablet by mouth daily. OTC sinus medication   Yes Historical Provider, MD  promethazine (PHENERGAN) 25 MG tablet Take 1 tablet (25 mg total) by mouth every 6 (six) hours as needed for nausea. 04/18/13  Yes Tiffany Kocher, PA-C    Allergies as of 04/18/2013 - Review Complete 04/18/2013  Allergen Reaction Noted  . Codeine    . Fentanyl    . Morphine    . Penicillins    . Sulfamethoxazole w-trimethoprim      Family  History  Problem Relation Age of Onset  . Colon cancer Neg Hx   . Ulcerative colitis Father     History   Social History  . Marital Status: Married    Spouse Name: N/A    Number of Children: 1  . Years of Education: N/A   Occupational History  .     Social History Main Topics  . Smoking status: Current Some Day Smoker -- 0.20 packs/day    Types: Cigarettes  . Smokeless tobacco: Not on file     Comment: 2-3 cigarettes per day  . Alcohol Use: No  . Drug Use: No  . Sexually Active: Not on file   Other Topics Concern  . Not on file   Social History Narrative   One son, Down syndrome    Review of Systems: See HPI, otherwise negative ROS   Physical Exam: BP 122/74  Temp(Src) 98 F (36.7 C) (Oral)  Resp 18  SpO2 99% General:   Alert,  pleasant and cooperative in NAD Head:  Normocephalic and atraumatic. Neck:  Supple; Lungs:  Clear throughout to auscultation.    Heart:  Regular rate and rhythm. Abdomen:  Soft, nontender and nondistended. Normal bowel sounds, without guarding, and without rebound.   Neurologic:  Alert and  oriented x4;  grossly normal neurologically.  Impression/Plan:     SCREENING  Plan:  1. TCS TODAY

## 2013-04-30 NOTE — Op Note (Signed)
[facility] [address] [place], [address]   COLONOSCOPY PROCEDURE REPORT  PATIENT: Natasha Wright, Natasha Wright  MR#: [id number] BIRTHDATE: 12-15-1960 , 51  yrs. old GENDER: Female ENDOSCOPIST: Jonette Eva, MD REFERRED NW:GNFAO Sherwood Gambler, M.D. PROCEDURE DATE:  04/30/2013 PROCEDURE:   Colonoscopy with biopsy INDICATIONS:unexplained diarrhea.  PT HAS IBS DIARRHEA PREDOMINANT. USES BENTYL PRN. HAS NL FORMED STOOLS 2-3 TIMES A WEEK. MEDICATIONS: Demerol 75 mg IV, Versed 5 mg IV, PREOP: Promethazine (Phenergan) 12.5mg  IV  DESCRIPTION OF PROCEDURE:    Physical exam was performed.  Informed consent was obtained from the patient after explaining the benefits, risks, and alternatives to procedure.  The patient was connected to monitor and placed in left lateral position. Continuous oxygen was provided by nasal cannula and IV medicine administered through an indwelling cannula.  After administration of sedation and rectal exam, the patients rectum was intubated and the EC-[id number] ([id number])  colonoscope was advanced under direct visualization to the ileum.  The scope was removed slowly by carefully examining the color, texture, anatomy, and integrity mucosa on the way out.  The patient was recovered in endoscopy and discharged home in satisfactory condition.     COLON FINDINGS: The mucosa appeared normal in the terminal ileum.  , Mild diverticulosis was noted in the descending colon and sigmoid colon.  , Small internal hemorrhoids were found.  , and The colon was otherwise normal.  There was no inflammation, polyps or cancers unless previously stated.  PREP QUALITY: good.     CECAL W/D TIME: 14 minutes  COMPLICATIONS: None  ENDOSCOPIC IMPRESSION: 1.   Mild diverticulosis in the descending colon and sigmoid colon 2.   Small internal hemorrhoids  RECOMMENDATIONS: AWAIT BIOPSY HIGH FIBER DIET TCS IN 10 YEARS       _______________________________ eSignedJonette Eva, MD 04/30/2013 2:58 PM

## 2013-05-01 ENCOUNTER — Encounter (HOSPITAL_COMMUNITY): Payer: Self-pay | Admitting: Gastroenterology

## 2013-05-02 ENCOUNTER — Telehealth: Payer: Self-pay | Admitting: Gastroenterology

## 2013-05-02 NOTE — Telephone Encounter (Signed)
PLEASE CALL PT. Her colon Bx are normal. HER DIARRHEA IS DUE TO IBS.   CONTINUE BENTYL MINIMIZE HER DAIRY INTAKE.  Follow a HIGH FIBER DIET. AVOID ITEMS THAT CAUSE BLOATING.   USE PREPARATION H FOUR TIMES A DAY FOR 7 DAYS IF YOU HAVE RECTAL BLEEDING.   Next colonoscopy in 10 years.

## 2013-05-02 NOTE — Telephone Encounter (Signed)
Called and informed pt.  

## 2013-05-02 NOTE — Progress Notes (Signed)
LMOM to call.

## 2013-05-02 NOTE — Progress Notes (Signed)
Quick Note:  LMOM to call. Also, mailed a normal lab letter. ______

## 2013-05-02 NOTE — Progress Notes (Signed)
Quick Note:  Negative celiac labs. ______

## 2013-05-02 NOTE — Telephone Encounter (Signed)
Cc PCP 

## 2013-05-09 NOTE — Telephone Encounter (Signed)
Reminder in epic °

## 2013-09-27 ENCOUNTER — Other Ambulatory Visit: Payer: Self-pay

## 2013-10-20 NOTE — Progress Notes (Signed)
REVIEWED.  TCS JUN 2014 TIC/IH/NL COLON Bx-DIARRHEA DUE TO IBS

## 2013-12-03 ENCOUNTER — Other Ambulatory Visit: Payer: Self-pay

## 2013-12-03 DIAGNOSIS — Z1231 Encounter for screening mammogram for malignant neoplasm of breast: Secondary | ICD-10-CM

## 2013-12-17 ENCOUNTER — Ambulatory Visit
Admission: RE | Admit: 2013-12-17 | Discharge: 2013-12-17 | Disposition: A | Discharge status: Home / Self Care | Payer: Managed Care, Other (non HMO) | Source: Ambulatory Visit

## 2013-12-17 ENCOUNTER — Ambulatory Visit: Payer: Managed Care, Other (non HMO)

## 2013-12-17 DIAGNOSIS — Z1231 Encounter for screening mammogram for malignant neoplasm of breast: Secondary | ICD-10-CM

## 2013-12-18 ENCOUNTER — Other Ambulatory Visit: Payer: Self-pay | Admitting: Physical Medicine and Rehabilitation

## 2013-12-18 DIAGNOSIS — M542 Cervicalgia: Secondary | ICD-10-CM

## 2013-12-18 DIAGNOSIS — M5412 Radiculopathy, cervical region: Secondary | ICD-10-CM

## 2013-12-18 DIAGNOSIS — G894 Chronic pain syndrome: Secondary | ICD-10-CM

## 2013-12-18 DIAGNOSIS — M961 Postlaminectomy syndrome, not elsewhere classified: Secondary | ICD-10-CM

## 2013-12-28 ENCOUNTER — Ambulatory Visit
Admission: RE | Admit: 2013-12-28 | Discharge: 2013-12-28 | Disposition: A | Discharge status: Home / Self Care | Payer: Managed Care, Other (non HMO) | Source: Ambulatory Visit | Attending: Physical Medicine and Rehabilitation | Admitting: Physical Medicine and Rehabilitation

## 2013-12-28 DIAGNOSIS — M5412 Radiculopathy, cervical region: Secondary | ICD-10-CM

## 2013-12-28 DIAGNOSIS — G894 Chronic pain syndrome: Secondary | ICD-10-CM

## 2013-12-28 DIAGNOSIS — M961 Postlaminectomy syndrome, not elsewhere classified: Secondary | ICD-10-CM

## 2013-12-28 DIAGNOSIS — M542 Cervicalgia: Secondary | ICD-10-CM

## 2013-12-28 MED ORDER — GADOBENATE DIMEGLUMINE 529 MG/ML IV SOLN
14.0000 mL | Freq: Once | INTRAVENOUS | Status: AC | PRN
  Administered 2013-12-28: 14 mL via INTRAVENOUS

## 2014-01-30 ENCOUNTER — Other Ambulatory Visit (HOSPITAL_COMMUNITY): Payer: Self-pay | Admitting: Internal Medicine

## 2014-01-30 DIAGNOSIS — R1310 Dysphagia, unspecified: Secondary | ICD-10-CM

## 2014-01-31 ENCOUNTER — Ambulatory Visit (HOSPITAL_COMMUNITY)
Admission: RE | Admit: 2014-01-31 | Discharge: 2014-01-31 | Disposition: A | Discharge status: Home / Self Care | Payer: Managed Care, Other (non HMO) | Source: Ambulatory Visit | Attending: Internal Medicine | Admitting: Internal Medicine

## 2014-01-31 DIAGNOSIS — K219 Gastro-esophageal reflux disease without esophagitis: Secondary | ICD-10-CM | POA: Insufficient documentation

## 2014-01-31 DIAGNOSIS — R059 Cough, unspecified: Secondary | ICD-10-CM | POA: Insufficient documentation

## 2014-01-31 DIAGNOSIS — Z981 Arthrodesis status: Secondary | ICD-10-CM | POA: Insufficient documentation

## 2014-01-31 DIAGNOSIS — R05 Cough: Secondary | ICD-10-CM | POA: Insufficient documentation

## 2014-01-31 DIAGNOSIS — R1310 Dysphagia, unspecified: Secondary | ICD-10-CM | POA: Insufficient documentation

## 2014-02-28 ENCOUNTER — Encounter: Payer: Self-pay | Admitting: *Deleted

## 2014-03-01 ENCOUNTER — Encounter: Payer: Self-pay | Admitting: Internal Medicine

## 2014-03-01 ENCOUNTER — Ambulatory Visit (INDEPENDENT_AMBULATORY_CARE_PROVIDER_SITE_OTHER): Payer: Managed Care, Other (non HMO) | Admitting: Internal Medicine

## 2014-03-01 VITALS — BP 112/80 | HR 90 | Ht 67.0 in | Wt 167.7 lb

## 2014-03-01 DIAGNOSIS — G4734 Idiopathic sleep related nonobstructive alveolar hypoventilation: Secondary | ICD-10-CM

## 2014-03-01 DIAGNOSIS — R072 Precordial pain: Secondary | ICD-10-CM | POA: Insufficient documentation

## 2014-03-01 DIAGNOSIS — I8310 Varicose veins of unspecified lower extremity with inflammation: Secondary | ICD-10-CM

## 2014-03-01 DIAGNOSIS — R0602 Shortness of breath: Secondary | ICD-10-CM

## 2014-03-01 DIAGNOSIS — Z7289 Other problems related to lifestyle: Secondary | ICD-10-CM

## 2014-03-01 DIAGNOSIS — Z789 Other specified health status: Secondary | ICD-10-CM

## 2014-03-01 DIAGNOSIS — R079 Chest pain, unspecified: Secondary | ICD-10-CM

## 2014-03-01 DIAGNOSIS — R0989 Other specified symptoms and signs involving the circulatory and respiratory systems: Secondary | ICD-10-CM

## 2014-03-01 DIAGNOSIS — Z01810 Encounter for preprocedural cardiovascular examination: Secondary | ICD-10-CM

## 2014-03-01 DIAGNOSIS — R0609 Other forms of dyspnea: Secondary | ICD-10-CM

## 2014-03-01 DIAGNOSIS — R0902 Hypoxemia: Secondary | ICD-10-CM

## 2014-03-01 NOTE — Patient Instructions (Signed)
Your physician has requested that you have a lexiscan myoview. For further information please visit [email or web address]. Please follow instruction sheet, as given.  Your physician recommends that you schedule a follow-up appointment in: 1 month - after your stress test.   

## 2014-03-01 NOTE — Progress Notes (Signed)
OFFICE NOTE  Chief Complaint:  Chest pain, DOE, abnormal overnight oximetry  Primary Care Physician: Glo Herring., MD  HPI:  Natasha Wright is a pleasant 53 year old female with a history of some anxiety and abnormal cholesterol as well as smoking in the past. She also has a family history of coronary disease and recently has been having increasing chest tightness. The chest tightness is described as an achiness or a pressure that comes across the chest and is becoming more frequent, does not necessarily associate with exertion or necessarily is it associated by rest; however, relaxation does improve it somewhat. 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 With regards to the pain, she reports it does not radiate, is about an 8 to 9 out of 10 on a 1 to 10 scale and, again, is kind of a dull, achy, pressure quality. She also has a history of extensive C-spine surgery with difficulty in flexing her neck due to spinal plates in the past. She is postmenopausal, status post TABSO at age 53 and, again, continues to smoke.  She underwent an exercise nuclear stress test in December 2012 for evaluation and her EF was 65% with no ischemia.  Recently she's been having trouble waking up at night and gasping for breath. She was noted to have 2 hypoxemic episodes with oxygen saturations in the low 70s by overnight oximetry. She has been referred to pulmonary as she also had PFTs indicating some restriction. In addition, she has complained of some chest discomfort and shortness of breath including left arm pain and tingling in her arms. An EKG in the office today shows normal sinus rhythm.  PMHx:  Past Medical History  Diagnosis Date  . IBS (irritable bowel syndrome)   . Spinal stenosis in cervical region   . Lumbar disc disease   . B12 deficiency     h/o  .  Hypoglycemia   . PONV (postoperative nausea and vomiting)   . OA (osteoarthritis)   . Anxiety   . Family history of heart disease     Past Surgical History  Procedure Laterality Date  . Colonoscopy  07/08/2003    RMR: Left-sided diverticula, remainder of colonic mucosa and terminal ileum appeared normal.. Minimally friable internal hemorrhoids, otherwise normal rectum  . Abdominal hysterectomy  1992  . Cholecystectomy  1994    stones/polyps  . Cervical spine surgery  2009  . Colonoscopy N/A 04/30/2013    Procedure: COLONOSCOPY;  Surgeon: Danie Binder, MD;  Location: [facility];  Service: Endoscopy;  Laterality: N/A;  2:15  . Tonsillectomy  1980  . Nm myocar perf wall motion  10/2011    bruce myoview - breast attenuation noted in anterior region; EF 65%; no ischemia/infarct/scar; low risk    FAMHx:  Family History  Problem Relation Age of Onset  . Colon cancer Neg Hx   . Ulcerative colitis Father   . Diabetes Mother   . Hypertension Mother   . Hyperlipidemia Mother   . Breast cancer Maternal Grandmother   . Hypertension Maternal Grandmother   . Diabetes Maternal Grandmother   . Stroke Maternal Grandmother   . Stroke Maternal Grandfather   . Cancer Maternal Grandfather   . Stroke Paternal Grandmother   . Diabetes Paternal Grandmother   . Heart disease Paternal Grandmother   . Heart disease Paternal Grandfather   .  Hyperlipidemia Brother   . Hyperlipidemia Sister   . Hypertension Sister   . Diabetes Sister   . Down syndrome Child   . Heart disease Child     SOCHx:   reports that she quit smoking about 5 weeks ago. Her smoking use included Cigarettes. She has a 7.2 pack-year smoking history. She has never used smokeless tobacco. She reports that she does not drink alcohol or use illicit drugs.  ALLERGIES:  Allergies  Allergen Reactions  . Codeine     REACTION: gi upset, skin crawling  . Fentanyl     REACTION: skin crawling, nausea, passed out  . Morphine      REACTION: GI upset, skin crawling  . Nsaids   . Penicillins     REACTION: rash, sob  . Sulfamethoxazole-Trimethoprim     REACTION: rash, sob    ROS: A comprehensive review of systems was negative except for: Respiratory: positive for dyspnea on exertion Cardiovascular: positive for chest pain  HOME MEDS: Current Outpatient Prescriptions  Medication Sig Dispense Refill  . diazepam (VALIUM) 10 MG tablet Take 10 mg by mouth every 8 (eight) hours as needed (back pain).       [name] dicyclomine (BENTYL) 10 MG capsule Take 10 mg by mouth daily as needed (for irritable bowel syndrome).       [name] estradiol (ESTRACE) 0.5 MG tablet Take 0.5 mg by mouth daily.      . fexofenadine (ALLEGRA) 180 MG tablet Take 180 mg by mouth daily.      . Multiple Vitamins-Minerals (MULTIVITAMINS THER. W/MINERALS) TABS Take 1 tablet by mouth daily.       No current facility-administered medications for this visit.    LABS/IMAGING: No results found for this or any previous visit (from the past 48 hour(s)). No results found.  VITALS: BP 112/80  Pulse 90  Ht 5\' 7"  (1.702 m)  Wt 167 lb 11.2 oz (76.068 kg)  BMI 26.26 kg/m2  EXAM: General appearance: alert and no distress Neck: no carotid bruit and no JVD Lungs: clear to auscultation bilaterally Heart: regular rate and rhythm Abdomen: soft, non-tender; bowel sounds normal; no masses,  no organomegaly Extremities: extremities normal, atraumatic, no cyanosis or edema Pulses: 2+ and symmetric Skin: Skin color, texture, turgor normal. No rashes or lesions Neurologic: Grossly normal Psych: Mood, affect normal  EKG: Normal sinus rhythm at 90  ASSESSMENT: 1. Atypical chest pain 2. Nocturnal hypoxemia-concerning for sleep apnea 3. Moderate restriction on PFTs with a history of smoking  PLAN: 1.   [name] is having persistent chest pain which began seems atypical. Her stress test however was 3 years ago. She can no longer walk on a treadmill due to her  shortness of breath and significant neck pain. I would recommend a LexiScan nuclear stress test to evaluate for ischemia, do to her inability to exercise. This would also service a possible preoperative evaluation as she is considering a repeat surgery on her neck. She is scheduled to see Dr. Melvyn Novas with pulmonology and most likely will be scheduled for a sleep study through their office.  Thanks for the referral.  Pixie Casino, MD, [facility] Attending Cardiologist [place] 03/01/2014, 4:55 PM

## 2014-03-04 ENCOUNTER — Other Ambulatory Visit (HOSPITAL_COMMUNITY): Payer: Self-pay | Admitting: Physician Assistant

## 2014-03-04 DIAGNOSIS — M159 Polyosteoarthritis, unspecified: Secondary | ICD-10-CM

## 2014-03-04 DIAGNOSIS — Z0000 Encounter for general adult medical examination without abnormal findings: Secondary | ICD-10-CM

## 2014-03-06 ENCOUNTER — Ambulatory Visit (INDEPENDENT_AMBULATORY_CARE_PROVIDER_SITE_OTHER): Payer: Managed Care, Other (non HMO) | Admitting: Internal Medicine

## 2014-03-06 ENCOUNTER — Ambulatory Visit (INDEPENDENT_AMBULATORY_CARE_PROVIDER_SITE_OTHER)
Admission: RE | Admit: 2014-03-06 | Discharge: 2014-03-06 | Disposition: A | Discharge status: Home / Self Care | Payer: Managed Care, Other (non HMO) | Source: Ambulatory Visit | Attending: Internal Medicine | Admitting: Internal Medicine

## 2014-03-06 ENCOUNTER — Encounter: Payer: Self-pay | Admitting: Internal Medicine

## 2014-03-06 VITALS — BP 110/62 | HR 77 | Temp 98.3°F | Ht 67.0 in | Wt 168.8 lb

## 2014-03-06 DIAGNOSIS — R0609 Other forms of dyspnea: Secondary | ICD-10-CM

## 2014-03-06 DIAGNOSIS — R0989 Other specified symptoms and signs involving the circulatory and respiratory systems: Secondary | ICD-10-CM

## 2014-03-06 MED ORDER — PANTOPRAZOLE SODIUM 40 MG PO TBEC: 40.0000 mg | DELAYED_RELEASE_TABLET | Freq: Every day | ORAL | Status: DC

## 2014-03-06 MED ORDER — FAMOTIDINE 20 MG PO TABS: ORAL_TABLET | ORAL | Status: DC

## 2014-03-06 NOTE — Patient Instructions (Addendum)
Pantoprazole (protonix) 40 mg   Take 30-60 min before first meal of the day and Pepcid 20 mg one bedtime  And  For drainage take chlortrimeton (chlorpheniramine) 4 mg every 4 hours available over the counter (may cause drowsiness so great to take at night  1-2 )  GERD (REFLUX)  is an extremely common cause of respiratory symptoms that mimic lung problems just like yours, many times with no significant heartburn at all.    It can be treated with medication, but also with lifestyle changes including avoidance of late meals, excessive alcohol, smoking cessation, and avoid fatty foods, chocolate, peppermint, colas, red wine, and acidic juices such as orange juice.  NO MINT OR MENTHOL PRODUCTS SO NO COUGH DROPS  USE SUGARLESS CANDY INSTEAD (jolley ranchers or Stover's)  NO OIL BASED VITAMINS - use powdered substitutes.    If cough or night time gasping  persists next step is Sinus Ct > call to schedule at [phone] and ask for [facility]  Please remember to go to the  x-ray department downstairs for your tests - we will call you with the results when they are available.  Your lung function is normal so no need for follow up here

## 2014-03-06 NOTE — Progress Notes (Signed)
Subjective:    Patient ID: Natasha Wright, female    DOB: Dec 09, 1960     MRN: [id number]  HPI  53 yowf allergies/asthma as child and started smoking age 53 then lateer started intermittent  inhaler in 20s with onset of persistent symptoms since Jan 2015 so quit smoking 12/2013  and referred 03/06/2014 by Dr Gerarda Fraction to pulmonary clinic for unexplained sob.  03/06/2014 1st [place] [place] office visit/ Wert  Chief Complaint  Patient presents with  . Pulmonary Consult    Referred per Dr. Gerarda Fraction for eval of abnormal spirometry. Pt states that she had bronchitis in Jan and never go over it. She c/o SOB with or without exertion x 3 wks. She wakes up every hour SOB and gasping for air. She also c/o prod cough with clear sputum.  She has CP occ.   sob off and on x 5 years much worse since Jan 2015 no better since quit smoking, in fact worse, and no better with saba.   Cough is sporadic but tends to be worse when lie down, min productive, mucoid sputum.  Doe even if not coughing x 5 min at nl pace, much worse if get in hurry or climbs 10 steps   No obvious other patterns in day to day or daytime variabilty or assoc typically pleuritic or exertional  cp or chest tightness, subjective wheeze overt sinus or hb symptoms. No unusual exp hx or h/o childhood pna/ asthma or knowledge of premature birth.  Sleeping ok without nocturnal  or early am exacerbation  of respiratory  c/o's or need for noct saba. Also denies any obvious fluctuation of symptoms with weather or environmental changes or other aggravating or alleviating factors except as outlined above   Current Medications, Allergies, Complete Past Medical History, Past Surgical History, Family History, and Social History were reviewed in [employer] record.              Review of Systems  Constitutional: Negative for fever, chills and unexpected weight change.  HENT: Positive for congestion and sneezing. Negative for  dental problem, ear pain, nosebleeds, postnasal drip, rhinorrhea, sinus pressure, sore throat, trouble swallowing and voice change.   Eyes: Negative for visual disturbance.  Respiratory: Positive for cough and shortness of breath. Negative for choking.   Cardiovascular: Positive for chest pain. Negative for leg swelling.  Gastrointestinal: Negative for vomiting, abdominal pain and diarrhea.  Genitourinary: Negative for difficulty urinating.  Musculoskeletal: Positive for arthralgias.  Skin: Negative for rash.  Neurological: Positive for headaches. Negative for tremors and syncope.  Hematological: Does not bruise/bleed easily.       Objective:   Physical Exam  Wt Readings from Last 3 Encounters:  03/06/14 168 lb 12.8 oz (76.567 kg)  03/01/14 167 lb 11.2 oz (76.068 kg)  04/18/13 166 lb 12.8 oz (75.66 kg)      HEENT: nl dentition, turbinates, and orophanx. Nl external ear canals without cough reflex   NECK :  without JVD/Nodes/TM/ nl carotid upstrokes bilaterally   LUNGS: no acc muscle use, clear to A and P bilaterally without cough on insp or exp maneuvers   CV:  RRR  no s3 or murmur or increase in P2, no edema   ABD:  soft and nontender with nl excursion in the supine position. No bruits or organomegaly, bowel sounds nl  MS:  warm without deformities, calf tenderness, cyanosis or clubbing  SKIN: warm and dry without lesions    NEURO:  alert, approp, no  deficits   CXR  03/06/2014 :    The heart size and mediastinal contours are within normal limits. Both lungs are clear. The visualized skeletal structures are unremarkable.          Assessment & Plan:

## 2014-03-06 NOTE — Progress Notes (Signed)
Quick Note:  Spoke with pt and notified of results per Dr. Wert. Pt verbalized understanding and denied any questions.  ______ 

## 2014-03-07 ENCOUNTER — Ambulatory Visit (HOSPITAL_COMMUNITY)
Admission: RE | Admit: 2014-03-07 | Discharge: 2014-03-07 | Disposition: A | Discharge status: Home / Self Care | Payer: Managed Care, Other (non HMO) | Source: Ambulatory Visit | Attending: Cardiology | Admitting: Cardiology

## 2014-03-07 DIAGNOSIS — R079 Chest pain, unspecified: Secondary | ICD-10-CM

## 2014-03-07 DIAGNOSIS — R0602 Shortness of breath: Secondary | ICD-10-CM

## 2014-03-07 DIAGNOSIS — Z8249 Family history of ischemic heart disease and other diseases of the circulatory system: Secondary | ICD-10-CM | POA: Insufficient documentation

## 2014-03-07 DIAGNOSIS — R42 Dizziness and giddiness: Secondary | ICD-10-CM | POA: Insufficient documentation

## 2014-03-07 DIAGNOSIS — Z789 Other specified health status: Secondary | ICD-10-CM

## 2014-03-07 DIAGNOSIS — Z01810 Encounter for preprocedural cardiovascular examination: Secondary | ICD-10-CM

## 2014-03-07 MED ORDER — TECHNETIUM TC 99M SESTAMIBI GENERIC - CARDIOLITE
10.8000 | Freq: Once | INTRAVENOUS | Status: AC | PRN
  Administered 2014-03-07: 11 via INTRAVENOUS

## 2014-03-07 MED ORDER — AMINOPHYLLINE 25 MG/ML IV SOLN
75.0000 mg | Freq: Once | INTRAVENOUS | Status: AC
Start: 2014-03-07 — End: 2014-03-07
  Administered 2014-03-07: 75 mg via INTRAVENOUS

## 2014-03-07 MED ORDER — TECHNETIUM TC 99M SESTAMIBI GENERIC - CARDIOLITE
30.1000 | Freq: Once | INTRAVENOUS | Status: AC | PRN
  Administered 2014-03-07: 30.1 via INTRAVENOUS

## 2014-03-07 MED ORDER — REGADENOSON 0.4 MG/5ML IV SOLN
0.4000 mg | Freq: Once | INTRAVENOUS | Status: AC
  Administered 2014-03-07: 0.4 mg via INTRAVENOUS

## 2014-03-07 NOTE — Procedures (Addendum)
[facility] [address] [place] [address] [phone]  Cardiology Nuclear Med Study  Natasha Wright is a 53 y.o. female     MRN : [id number]     DOB: [birth year]  Procedure Date: 03/07/2014  Nuclear Med Background Indication for Stress Test:  Surgical Clearance History:  No prior cardiac or respiratory problems reported;Last stress MPI on 11/09/2011-nonischemic;EF=65% Cardiac Risk Factors: Family History - CAD, History of Smoking, Lipids, Overweight, PVD and Smoker  Symptoms:  Chest Pain, Dizziness, Fatigue, Light-Headedness and SOB   Nuclear Pre-Procedure Caffeine/Decaff Intake:  1:00am NPO After: 11am   IV Site: R Forearm  IV 0.9% NS with Angio Cath:  22g  Chest Size (in):  n/a IV Started by: Rolene Course, RN  Height: 5\' 7"  (1.702 m)  Cup Size: DD  BMI:  Body mass index is 26.15 kg/(m^2). Weight:  167 lb (75.751 kg)   Tech Comments:  n/a    Nuclear Med Study 1 or 2 day study: 1 day  Stress Test Type:  [place] Provider:  Lyman Bishop, MD   Resting Radionuclide: Technetium 53m Sestamibi  Resting Radionuclide Dose: 10.8 mCi   Stress Radionuclide:  Technetium 27m Sestamibi  Stress Radionuclide Dose: 30.1 mCi           Stress Protocol Rest HR: 71 Stress HR: 106  Rest BP: 135/88 Stress BP: 133/86  Exercise Time (min): n/a METS: n/a   Predicted Max HR: 166 bpm % Max HR: 69.88 bpm Rate Pressure Product: 15660  Dose of Adenosine (mg):  n/a Dose of Lexiscan: 0.4 mg  Dose of Atropine (mg): n/a Dose of Dobutamine: n/a mcg/kg/min (at max HR)  Stress Test Technologist: Leane Para, CCT Nuclear Technologist: Imagene Riches, CNMT   Rest Procedure:  Myocardial perfusion imaging was performed at rest 45 minutes following the intravenous administration of Technetium 44m Sestamibi. Stress Procedure:  The patient received IV Lexiscan 0.4 mg over 15-seconds.  Technetium 60m Sestamibi injected IV at  30-seconds.  Patient experienced SOB, Chest Pressure, Headache and flushing; 75 MG Aminophylline IV was administered at 5 minutes. There were no significant changes with Lexiscan.  Quantitative spect images were obtained after a 45 minute delay.  Transient Ischemic Dilatation (Normal <1.22):  1.37 Lung/Heart Ratio (Normal <0.45):  0.29 QGS EDV:  62 ml QGS ESV:  25 ml LV Ejection Fraction: 60%     Rest ECG: NSR - Normal EKG  Stress ECG: No significant change from baseline ECG  QPS Raw Data Images:  Normal; no motion artifact; normal heart/lung ratio. Stress Images:  Normal homogeneous uptake in all areas of the myocardium. Rest Images:  There is mild apical thinning with normal uptake in other regions. Subtraction (SDS):  Normal  Impression Exercise Capacity:  Lexiscan with no exercise. BP Response:  Normal blood pressure response. Clinical Symptoms:  Mild shortness of breath, headache, flushing and chest sensation ECG Impression:  No significant ST segment change suggestive of ischemia. Comparison with Prior Nuclear Study: No significant change from previous study  Overall Impression:  Normal stress nuclear study with minimal apical thinning  LV Wall Motion:  NL LV Function, EF 60%; NL Wall Motion   Troy Sine, MD  03/07/2014 4:54 PM

## 2014-03-10 NOTE — Assessment & Plan Note (Signed)
-   spirometry wnl 03/06/14  - 03/09/2014  Walked RA  2 laps @ 185 ft each stopped due to  Sob with sats 99%  Symptoms are markedly disproportionate to objective findings and not clear this is a lung problem but pt does appear to have difficult airway management issues. DDX of  difficult airways managment all start with A and  include Adherence, Ace Inhibitors, Acid Reflux, Active Sinus Disease, Alpha 1 Antitripsin deficiency, Anxiety masquerading as Airways dz,  ABPA,  allergy(esp in young), Aspiration (esp in elderly), Adverse effects of DPI,  Active smokers, plus two Bs  = Bronchiectasis and Beta blocker use..and one C= CHF  ? Acid (or non-acid) GERD > always difficult to exclude as up to 75% of pts in some series report no assoc GI/ Heartburn symptoms> rec max (24h)  acid suppression and diet restrictions/ reviewed and instructions given in writing.   ? Sinus dz > sinus CT next  ? Anxiety > typically a dx of exclusion but one strong indicator is all her symptoms are worse since she quit smoking .  For now rec focus on gerd/pnds and noct sob/cough then regroup if not better with sinus ct on return.

## 2014-03-11 ENCOUNTER — Ambulatory Visit (HOSPITAL_COMMUNITY)
Admission: RE | Admit: 2014-03-11 | Discharge: 2014-03-11 | Disposition: A | Discharge status: Home / Self Care | Payer: Managed Care, Other (non HMO) | Source: Ambulatory Visit | Attending: Physician Assistant | Admitting: Physician Assistant

## 2014-03-11 DIAGNOSIS — M159 Polyosteoarthritis, unspecified: Secondary | ICD-10-CM | POA: Insufficient documentation

## 2014-03-11 DIAGNOSIS — Z0000 Encounter for general adult medical examination without abnormal findings: Secondary | ICD-10-CM

## 2014-03-13 ENCOUNTER — Encounter: Payer: Self-pay | Admitting: Neurology

## 2014-03-13 ENCOUNTER — Ambulatory Visit (INDEPENDENT_AMBULATORY_CARE_PROVIDER_SITE_OTHER): Payer: Medicare HMO | Admitting: Neurology

## 2014-03-13 VITALS — BP 112/69 | HR 65 | Temp 97.7°F | Ht 67.0 in | Wt 165.0 lb

## 2014-03-13 DIAGNOSIS — F40240 Claustrophobia: Secondary | ICD-10-CM

## 2014-03-13 DIAGNOSIS — M2550 Pain in unspecified joint: Secondary | ICD-10-CM

## 2014-03-13 DIAGNOSIS — M62838 Other muscle spasm: Secondary | ICD-10-CM

## 2014-03-13 DIAGNOSIS — G2581 Restless legs syndrome: Secondary | ICD-10-CM

## 2014-03-13 DIAGNOSIS — G4761 Periodic limb movement disorder: Secondary | ICD-10-CM

## 2014-03-13 DIAGNOSIS — G4734 Idiopathic sleep related nonobstructive alveolar hypoventilation: Secondary | ICD-10-CM

## 2014-03-13 DIAGNOSIS — F40298 Other specified phobia: Secondary | ICD-10-CM

## 2014-03-13 DIAGNOSIS — R0902 Hypoxemia: Secondary | ICD-10-CM

## 2014-03-13 DIAGNOSIS — G8929 Other chronic pain: Secondary | ICD-10-CM

## 2014-03-13 NOTE — Progress Notes (Signed)
Subjective:    Patient ID: Natasha Wright is a 53 y.o. female.  HPI    Star Age, MD, PhD [facility] [address] [place], [place] [address]  Dear Dr. Gerarda Fraction,   I saw your patient, Natasha Wright, upon your kind request in my neurologic clinic today for initial consultation of her sleep disorder, concern for obstructive sleep apnea. The patient is unaccompanied today. As you know, Natasha Wright is a 53 year old right-handed woman with an underlying medical history of allergic rhinitis, IBS, arthritis, chronic low back pain and neck pain, fibromyalgia, migraines, hypoglycemia, macular degeneration, chest pain, carpal tunnel syndrome, and chronic pain, who recently had an overnight pulse oximetry test which was abnormal. I reviewed her pulse oximetry test from 02/11/2014. Start time was 1:25 AM, end time was 6:58 AM. Mean oxygen saturation was 93.28%, lowest oxygen saturation was 71%, but this looks like an artifact. Time below 88 percent saturation was 17 minutes and 57 seconds. However, looking at the graph, it looks like there may have been some loss of connectivity or faulty reading for the first few hours of the test and with the nadir. She saw Dr. Melvyn Novas in pulmonary last week, who diagnosed her with GER and has no COPD. She had a nuclear stress test with Dr. Debara Pickett, which was negative.  She has pain at night, and has multiple night time awakenings. She quit working some 6 weeks ago. She was recently diagnosed with B12 deficiency and has started shots for that. She does not sleep well and does wake up gasping.  She had neck surgery in 2009 and now sees Dr. Ace Gins, who has done nerve blocks. She does not do well with narcotic pain medications. She does not smoke cigarettes and uses a vapor device. She drinks alcohol occasionally. She lives with her husband and her 51 yo son, who has special needs.  She drinks coffee 2 cups in AM, iced tea during  the day, no sodas. She has been taking a nap nearly daily, and has a tendency to fall asleep inadvertently in her recliner.   Her typical bedtime is reported to be around 11 PM and usual wake time is around 6:30 AM. Sleep onset typically occurs within a few minutes. She reports feeling poorly rested upon awakening. She wakes up on an average 6-7 times in the middle of the night and has to go to the bathroom 0 to 1 time on a typical night. She denies morning headaches.  She reports excessive daytime somnolence (EDS) and Her Epworth Sleepiness Score (ESS) is 5/24 today. She has not fallen asleep while driving.   She wakes up gasping, but has not been told that she snores.   She denies cataplexy, sleep paralysis, hypnagogic or hypnopompic hallucinations, or sleep attacks. She does not report any vivid dreams, nightmares, dream enactments, or parasomnias, such as sleep talking or sleep walking. The patient has not had a sleep study or a home sleep test. Her bedroom is usually dark and cool. There is no TV in the bedroom.   Her Past Medical History Is Significant For: Past Medical History  Diagnosis Date  . IBS (irritable bowel syndrome)   . Spinal stenosis in cervical region   . Lumbar disc disease   . B12 deficiency     h/o  . Hypoglycemia   . PONV (postoperative nausea and vomiting)   . OA (osteoarthritis)   . Anxiety   . Family history of heart disease  Her Past Surgical History Is Significant For: Past Surgical History  Procedure Laterality Date  . Colonoscopy  07/08/2003    RMR: Left-sided diverticula, remainder of colonic mucosa and terminal ileum appeared normal.. Minimally friable internal hemorrhoids, otherwise normal rectum  . Abdominal hysterectomy  1992  . Cholecystectomy  1994    stones/polyps  . Cervical spine surgery  2009  . Colonoscopy N/A 04/30/2013    Procedure: COLONOSCOPY;  Surgeon: Danie Binder, MD;  [facility];  Service: Endoscopy;  Laterality: N/A;   2:15  . Tonsillectomy  1980  . Nm myocar perf wall motion  10/2011    bruce myoview - breast attenuation noted in anterior region; EF 65%; no ischemia/infarct/scar; low risk    Her Family History Is Significant For: Family History  Problem Relation Age of Onset  . Colon cancer Neg Hx   . Ulcerative colitis Father   . Diabetes Mother   . Hypertension Mother   . Hyperlipidemia Mother   . Breast cancer Maternal Grandmother   . Hypertension Maternal Grandmother   . Diabetes Maternal Grandmother   . Stroke Maternal Grandmother   . Stroke Maternal Grandfather   . Breast cancer Maternal Grandmother   . Stroke Paternal Grandmother   . Diabetes Paternal Grandmother   . Heart disease Paternal Grandmother   . Heart disease Paternal Grandfather   . Hyperlipidemia Brother   . Hyperlipidemia Sister   . Hypertension Sister   . Diabetes Sister   . Down syndrome Child   . Heart disease Child   . Asthma Maternal Grandmother     Her Social History Is Significant For: History   Social History  . Marital Status: Married    Spouse Name: N/A    Number of Children: 1  . Years of Education: 14   Occupational History  . Unemployed    Social History Main Topics  . Smoking status: Former Smoker -- 0.50 packs/day for 40 years    Types: Cigarettes    Quit date: 12/24/2013  . Smokeless tobacco: Never Used     Comment: using vape as aid to sustain from smoking  . Alcohol Use: No  . Drug Use: No  . Sexual Activity: None   Other Topics Concern  . None   Social History Narrative   One son, Down syndrome    Her Allergies Are:  Allergies  Allergen Reactions  . Codeine     REACTION: gi upset, skin crawling  . Fentanyl     REACTION: skin crawling, nausea, passed out  . Morphine     REACTION: GI upset, skin crawling  . Nsaids   . Penicillins     REACTION: rash, sob  . Sulfamethoxazole-Trimethoprim     REACTION: rash, sob  . Influenza Vaccines Rash  :   Her Current Medications  Are:  Outpatient Encounter Prescriptions as of 03/13/2014  Medication Sig  . diazepam (VALIUM) 10 MG tablet Take 10 mg by mouth every 8 (eight) hours as needed (back pain).   [name] estradiol (ESTRACE) 0.5 MG tablet Take 0.5 mg by mouth daily.  . famotidine (PEPCID) 20 MG tablet One at bedtime  . pantoprazole (PROTONIX) 40 MG tablet Take 1 tablet (40 mg total) by mouth daily. Take 30-60 min before first meal of the day  :  Review of Systems:  Out of a complete 14 point review of systems, all are reviewed and negative with the exception of these symptoms as listed below:   Review of Systems  Constitutional: Positive for fatigue.  HENT: Positive for rhinorrhea.   Eyes: Negative.   Respiratory: Positive for cough and shortness of breath.   Cardiovascular: Negative.   Gastrointestinal: Positive for diarrhea.  Endocrine: Negative.   Genitourinary: Negative.   Musculoskeletal: Positive for arthralgias, joint swelling and myalgias.       Muscle cramps  Skin: Negative.   Allergic/Immunologic: Positive for environmental allergies.  Neurological: Positive for dizziness and weakness.  Hematological: Negative.   Psychiatric/Behavioral: Positive for sleep disturbance (insomnia, e.d.s., restless leg).    Objective:  Neurologic Exam  Physical Exam Physical Examination:   Filed Vitals:   03/13/14 1009  BP: 112/69  Pulse: 65  Temp: 97.7 F (36.5 C)    General Examination: The patient is a very pleasant 53 y.o. female in no acute distress. She appears well-developed and well-nourished and very well groomed.   HEENT: Normocephalic, atraumatic, pupils are equal, round and reactive to light and accommodation. Funduscopic exam is normal with sharp disc margins noted. Extraocular tracking is good without limitation to gaze excursion or nystagmus noted. Normal smooth pursuit is noted. Hearing is grossly intact. Tympanic membranes are clear bilaterally. Face is symmetric with normal facial animation  and normal facial sensation. Speech is clear with no dysarthria noted. There is no hypophonia. There is no lip, neck/head, jaw or voice tremor. Neck is stiff and she has significant decrease in range of passive and active motion. There are no carotid bruits on auscultation. Oropharynx exam reveals: mild mouth dryness, good dental hygiene and mild airway crowding, due to narrow airway entry. Mallampati is class I. Tongue protrudes centrally and palate elevates symmetrically. Tonsils are absent. Neck size is 15 1/8 inches.   Chest: Clear to auscultation without wheezing, rhonchi or crackles noted.  Heart: S1+S2+0, regular and normal without murmurs, rubs or gallops noted.   Abdomen: Soft, non-tender and non-distended with normal bowel sounds appreciated on auscultation.  Extremities: There is no pitting edema in the distal lower extremities bilaterally. Pedal pulses are intact.  Skin: Warm and dry without trophic changes noted. There are no varicose veins.  Musculoskeletal: exam reveals no obvious joint deformities, tenderness or joint swelling or erythema, except for tenderness in her thump joints with mild swelling and L foot puffiness.   Neurologically:  Mental status: The patient is awake, alert and oriented in all 4 spheres. Her immediate and remote memory, attention, language skills and fund of knowledge are appropriate. There is no evidence of aphasia, agnosia, apraxia or anomia. Speech is clear with normal prosody and enunciation. Thought process is linear. Mood is normal and affect is normal.  Cranial nerves II - XII are as described above under HEENT exam. In addition: shoulder shrug is normal with equal shoulder height noted. Motor exam: Normal bulk, strength and tone is noted. There is no drift, tremor or rebound. Romberg is negative. Reflexes are 2+ throughout. Babinski: Toes are flexor bilaterally. Fine motor skills and coordination: intact with normal finger taps, normal hand movements,  normal rapid alternating patting, normal foot taps and normal foot agility.  Cerebellar testing: No dysmetria or intention tremor on finger to nose testing. Heel to shin is unremarkable bilaterally. There is no truncal or gait ataxia.  Sensory exam: intact to light touch, pinprick, vibration, temperature sense and proprioception in the upper and lower extremities.  Gait, station and balance: She stands easily. No veering to one side is noted. No leaning to one side is noted. Posture is age-appropriate and stance is narrow based. Gait  shows normal stride length and normal pace. No problems turning are noted. She turns en bloc. Tandem walk is unremarkable. Intact toe and heel stance is noted.               Assessment and Plan:   In summary, Jaylissa Crichlow is a very pleasant 53 y.o.-year old female with a history and physical exam concerning for sleep disorder breathing with concern for nocturnal hypoxemia. Her sleep related complaints are sleep disruption, poor sleep quality, pain at night, poor sleep consolidation and EDS. In addition, she has symptoms of RLS and has been known to kick in her sleep or twitch. I had a long chat with the patient about my findings and the diagnosis of OSA, its prognosis and treatment options. We talked about medical treatments, surgical interventions and non-pharmacological approaches. I explained in particular the risks and ramifications of untreated moderate to severe OSA, especially with respect to developing cardiovascular disease down the Road, including congestive heart failure, difficult to treat hypertension, cardiac arrhythmias, or stroke. Even type 2 diabetes has, in part, been linked to untreated OSA. Symptoms of untreated OSA include daytime sleepiness, memory problems, mood irritability and mood disorder such as depression and anxiety, lack of energy, as well as recurrent headaches, especially morning headaches. We talked about trying to maintain a healthy  lifestyle in general, as well as the importance of weight control. I encouraged the patient to eat healthy, exercise daily and keep well hydrated, to keep a scheduled bedtime and wake time routine, to not skip any meals and eat healthy snacks in between meals. I advised the patient not to drive when feeling sleepy. I recommended the following at this time: sleep study with potential positive airway pressure titration.  I explained the sleep test procedure to the patient and also outlined possible surgical and non-surgical treatment options of OSA, including the use of a custom-made dental device (which would require a referral to a specialist dentist or oral surgeon), upper airway surgical options, such as pillar implants, radiofrequency surgery, tongue base surgery, and UPPP (which would involve a referral to an ENT surgeon). Rarely, jaw surgery such as mandibular advancement may be considered.  I also explained the CPAP treatment option to the patient, who indicated that she would be willing to try CPAP if the need arises. She is claustrophobic and apprehensive very much about the prospect of having a mask on her face. I tried to reassure her and explained to her that there may be a nasal pillows option that she may be able to tolerate. She is willing to consider this. I explained the importance of being compliant with PAP treatment, not only for insurance purposes but primarily to improve Her symptoms, and for the patient's long term health benefit, including to reduce Her cardiovascular risks. I answered all her questions today and the patient was in agreement. I would like to see her back after the sleep study is completed and encouraged her to call with any interim questions, concerns, problems or updates.   Thank you very much for allowing me to participate in the care of this nice patient. If I can be of any further assistance to you please do not hesitate to call me at  [phone].  Sincerely,   Star Age, MD, PhD

## 2014-03-13 NOTE — Patient Instructions (Addendum)
Based on your symptoms, your recent oxygen test overnight and your exam I believe you are at risk for obstructive sleep apnea or OSA, and I think we should proceed with a sleep study to determine whether you do or do not have OSA and how severe it is. If you have more than mild OSA, I want you to consider treatment with CPAP. Please remember, the risks and ramifications of moderate to severe obstructive sleep apnea or OSA are: Cardiovascular disease, including congestive heart failure, stroke, difficult to control hypertension, arrhythmias, and even type 2 diabetes has been linked to untreated OSA. Sleep apnea causes disruption of sleep and sleep deprivation in most cases, which, in turn, can cause recurrent headaches, problems with memory, mood, concentration, focus, and vigilance. Most people with untreated sleep apnea report excessive daytime sleepiness, which can affect their ability to drive. Please do not drive if you feel sleepy.  I will see you back after your sleep study to go over the test results and where to go from there. We will call you after your sleep study and to set up an appointment at the time.

## 2014-04-08 ENCOUNTER — Encounter: Payer: Self-pay | Admitting: Internal Medicine

## 2014-04-08 ENCOUNTER — Ambulatory Visit (INDEPENDENT_AMBULATORY_CARE_PROVIDER_SITE_OTHER): Payer: Managed Care, Other (non HMO) | Admitting: Internal Medicine

## 2014-04-08 VITALS — BP 105/69 | HR 82 | Ht 67.0 in | Wt 165.1 lb

## 2014-04-08 DIAGNOSIS — G4734 Idiopathic sleep related nonobstructive alveolar hypoventilation: Secondary | ICD-10-CM

## 2014-04-08 DIAGNOSIS — R0902 Hypoxemia: Secondary | ICD-10-CM

## 2014-04-08 DIAGNOSIS — R5383 Other fatigue: Secondary | ICD-10-CM

## 2014-04-08 DIAGNOSIS — M542 Cervicalgia: Secondary | ICD-10-CM

## 2014-04-08 DIAGNOSIS — R5381 Other malaise: Secondary | ICD-10-CM

## 2014-04-08 DIAGNOSIS — R079 Chest pain, unspecified: Secondary | ICD-10-CM

## 2014-04-08 NOTE — Patient Instructions (Signed)
Your physician recommends that you schedule a follow-up appointment as needed  

## 2014-04-08 NOTE — Progress Notes (Signed)
OFFICE NOTE  Chief Complaint:  Chest pain, DOE, abnormal overnight oximetry  Primary Care Physician: Glo Herring., MD  HPI:  Natasha Wright is a pleasant 53 year old female with a history of some anxiety and abnormal cholesterol as well as smoking in the past. She also has a family history of coronary disease and recently has been having increasing chest tightness. The chest tightness is described as an achiness or a pressure that comes across the chest and is becoming more frequent, does not necessarily associate with exertion or necessarily is it associated by rest; however, relaxation does improve it somewhat. 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 With regards to the pain, she reports it does not radiate, is about an 8 to 9 out of 10 on a 1 to 10 scale and, again, is kind of a dull, achy, pressure quality. She also has a history of extensive C-spine surgery with difficulty in flexing her neck due to spinal plates in the past. She is postmenopausal, status post TABSO at age 1 and, again, continues to smoke.  She underwent an exercise nuclear stress test in December 2012 for evaluation and her EF was 65% with no ischemia.  Recently she's been having trouble waking up at night and gasping for breath. She was noted to have 2 hypoxemic episodes with oxygen saturations in the low 70s by overnight oximetry. She has been referred to pulmonary as she also had PFTs indicating some restriction. In addition, she has complained of some chest discomfort and shortness of breath including left arm pain and tingling in her arms. An EKG in the office today shows normal sinus rhythm.  Natasha Wright returns today for followup of her nuclear stress test. This was negative for ischemia. In the interim she seen Dr. Melvyn Novas with pulmonology. He is diagnosed her with GERD and  is treating that. She is also seeing a neurologist and is going for formal sleep study but there is concern for sleep apnea as well as restless leg syndrome.  PMHx:  Past Medical History  Diagnosis Date  . IBS (irritable bowel syndrome)   . Spinal stenosis in cervical region   . Lumbar disc disease   . B12 deficiency     h/o  . Hypoglycemia   . PONV (postoperative nausea and vomiting)   . OA (osteoarthritis)   . Anxiety   . Family history of heart disease     Past Surgical History  Procedure Laterality Date  . Colonoscopy  07/08/2003    RMR: Left-sided diverticula, remainder of colonic mucosa and terminal ileum appeared normal.. Minimally friable internal hemorrhoids, otherwise normal rectum  . Abdominal hysterectomy  1992  . Cholecystectomy  1994    stones/polyps  . Cervical spine surgery  2009  . Colonoscopy N/A 04/30/2013    Procedure: COLONOSCOPY;  Surgeon: Danie Binder, MD;  [facility];  Service: Endoscopy;  Laterality: N/A;  2:15  . Tonsillectomy  1980  . Nm myocar perf wall motion  10/2011    bruce myoview - breast attenuation noted in anterior region; EF 65%; no ischemia/infarct/scar; low risk    FAMHx:  Family History  Problem Relation Age of Onset  . Colon cancer Neg Hx   . Ulcerative colitis Father   . Diabetes Mother   . Hypertension Mother   . Hyperlipidemia Mother   .  Breast cancer Maternal Grandmother   . Hypertension Maternal Grandmother   . Diabetes Maternal Grandmother   . Stroke Maternal Grandmother   . Stroke Maternal Grandfather   . Breast cancer Maternal Grandmother   . Stroke Paternal Grandmother   . Diabetes Paternal Grandmother   . Heart disease Paternal Grandmother   . Heart disease Paternal Grandfather   . Hyperlipidemia Brother   . Hyperlipidemia Sister   . Hypertension Sister   . Diabetes Sister   . Down syndrome Child   . Heart disease Child   . Asthma Maternal Grandmother     SOCHx:   reports that she quit smoking  about 3 months ago. Her smoking use included Cigarettes. She has a 20 pack-year smoking history. She has never used smokeless tobacco. She reports that she does not drink alcohol or use illicit drugs.  ALLERGIES:  Allergies  Allergen Reactions  . Codeine     REACTION: gi upset, skin crawling  . Fentanyl     REACTION: skin crawling, nausea, passed out  . Morphine     REACTION: GI upset, skin crawling  . Nsaids   . Penicillins     REACTION: rash, sob  . Sulfamethoxazole-Trimethoprim     REACTION: rash, sob  . Influenza Vaccines Rash    ROS: A comprehensive review of systems was negative except for: Respiratory: positive for dyspnea on exertion Cardiovascular: positive for chest pain  HOME MEDS: Current Outpatient Prescriptions  Medication Sig Dispense Refill  . diazepam (VALIUM) 10 MG tablet Take 10 mg by mouth every 8 (eight) hours as needed (back pain).       [name] estradiol (ESTRACE) 0.5 MG tablet Take 0.5 mg by mouth daily.      . famotidine (PEPCID) 20 MG tablet One at bedtime  39 tablet  2  . pantoprazole (PROTONIX) 40 MG tablet Take 1 tablet (40 mg total) by mouth daily. Take 30-60 min before first meal of the day  30 tablet  2   No current facility-administered medications for this visit.    LABS/IMAGING: No results found for this or any previous visit (from the past 48 hour(s)). No results found.  VITALS: BP 105/69  Pulse 82  Ht 5\' 7"  (1.702 m)  Wt 165 lb 1.6 oz (74.889 kg)  BMI 25.85 kg/m2  EXAM: deferred  EKG: deferred  ASSESSMENT: 1. Atypical chest pain - negative nuclear stress test 2. Nocturnal hypoxemia-concerning for sleep apnea, seeing neurologist 3. Moderate restriction on PFTs with a history of smoking 4. GERD  PLAN: 1.   Natasha Wright is having persistent chest pain which began seems atypical. Her stress test was negative for ischemia. I suspect her pain is coming from her neck and she is undergoing evaluation for that. She's also  contemplating surgery for that as well as a bunionectomy. I think she is low risk for both procedures. I can see her back as needed.  Thanks for the referral.  Pixie Casino, MD, [facility] Attending Cardiologist [place] 04/08/2014, 10:55 AM

## 2014-04-18 ENCOUNTER — Ambulatory Visit (INDEPENDENT_AMBULATORY_CARE_PROVIDER_SITE_OTHER): Payer: Medicare HMO | Admitting: Neurology

## 2014-04-18 DIAGNOSIS — M62838 Other muscle spasm: Secondary | ICD-10-CM

## 2014-04-18 DIAGNOSIS — G479 Sleep disorder, unspecified: Secondary | ICD-10-CM

## 2014-04-18 DIAGNOSIS — G4720 Circadian rhythm sleep disorder, unspecified type: Secondary | ICD-10-CM

## 2014-04-18 DIAGNOSIS — G4761 Periodic limb movement disorder: Secondary | ICD-10-CM

## 2014-04-18 DIAGNOSIS — G4734 Idiopathic sleep related nonobstructive alveolar hypoventilation: Secondary | ICD-10-CM

## 2014-04-18 DIAGNOSIS — G2581 Restless legs syndrome: Secondary | ICD-10-CM

## 2014-04-18 DIAGNOSIS — G8929 Other chronic pain: Secondary | ICD-10-CM

## 2014-04-18 DIAGNOSIS — G4763 Sleep related bruxism: Secondary | ICD-10-CM

## 2014-05-03 ENCOUNTER — Telehealth: Payer: Self-pay | Admitting: Neurology

## 2014-05-03 NOTE — Telephone Encounter (Signed)
I called and left a message for the patient about her recent sleep study results. I informed the patient that the study did not show any significant obstructive sleep apnea or significant oxygen desaturations. Dr. Rexene Alberts would like to discuss the sleep study in detail during a follow up visit. I have asked the patient to callback to schedule this appointment. I will fax a copy of the report to Dr. Nolon Rod office and mail a copy to the patient along with a two week sleep diary.

## 2014-05-03 NOTE — Telephone Encounter (Signed)
Please call and notify the patient that the recent sleep study did not show any significant obstructive sleep apnea or significant oxygen desaturations. Please inform patient that I would like to go over the details of the study during a follow up appointment and if not already previously scheduled, arrange a followup appointment (please utilize a followu-up slot). Please send patient a two-week sleep diary to fill out before her next appointment. Please advise her that we will be sending her a sleep log. Also, route or fax report to PCP and referring MD, if other than PCP.  Once you have spoken to patient, you can close this encounter.   Thanks,  Star Age, MD, PhD [facility])

## 2014-05-06 ENCOUNTER — Encounter (HOSPITAL_BASED_OUTPATIENT_CLINIC_OR_DEPARTMENT_OTHER): Payer: Self-pay | Admitting: *Deleted

## 2014-05-06 NOTE — Progress Notes (Signed)
No labs needed-has been cleared for surgery dr Debara Pickett-

## 2014-05-06 NOTE — Progress Notes (Signed)
Surgery is left foot

## 2014-05-08 ENCOUNTER — Other Ambulatory Visit: Payer: Self-pay | Admitting: Orthopedic Surgery

## 2014-05-09 ENCOUNTER — Ambulatory Visit (HOSPITAL_BASED_OUTPATIENT_CLINIC_OR_DEPARTMENT_OTHER)
Admission: RE | Admit: 2014-05-09 | Discharge: 2014-05-09 | Disposition: A | Discharge status: Home / Self Care | Payer: Managed Care, Other (non HMO) | Source: Ambulatory Visit | Attending: Orthopedic Surgery | Admitting: Orthopedic Surgery

## 2014-05-09 ENCOUNTER — Telehealth: Payer: Self-pay | Admitting: Neurology

## 2014-05-09 ENCOUNTER — Encounter (HOSPITAL_BASED_OUTPATIENT_CLINIC_OR_DEPARTMENT_OTHER): Payer: Managed Care, Other (non HMO) | Admitting: Anesthesiology

## 2014-05-09 ENCOUNTER — Encounter (HOSPITAL_BASED_OUTPATIENT_CLINIC_OR_DEPARTMENT_OTHER): Payer: Self-pay

## 2014-05-09 ENCOUNTER — Ambulatory Visit (HOSPITAL_BASED_OUTPATIENT_CLINIC_OR_DEPARTMENT_OTHER): Payer: Managed Care, Other (non HMO) | Admitting: Anesthesiology

## 2014-05-09 ENCOUNTER — Encounter: Payer: Self-pay | Admitting: *Deleted

## 2014-05-09 ENCOUNTER — Encounter (HOSPITAL_BASED_OUTPATIENT_CLINIC_OR_DEPARTMENT_OTHER)
Admission: RE | Disposition: A | Discharge status: Home / Self Care | Payer: Self-pay | Source: Ambulatory Visit | Attending: Orthopedic Surgery

## 2014-05-09 DIAGNOSIS — M2010 Hallux valgus (acquired), unspecified foot: Secondary | ICD-10-CM | POA: Insufficient documentation

## 2014-05-09 DIAGNOSIS — M4802 Spinal stenosis, cervical region: Secondary | ICD-10-CM | POA: Insufficient documentation

## 2014-05-09 DIAGNOSIS — M2012 Hallux valgus (acquired), left foot: Secondary | ICD-10-CM

## 2014-05-09 DIAGNOSIS — M2040 Other hammer toe(s) (acquired), unspecified foot: Secondary | ICD-10-CM | POA: Insufficient documentation

## 2014-05-09 DIAGNOSIS — M1990 Unspecified osteoarthritis, unspecified site: Secondary | ICD-10-CM | POA: Insufficient documentation

## 2014-05-09 DIAGNOSIS — M19079 Primary osteoarthritis, unspecified ankle and foot: Secondary | ICD-10-CM | POA: Insufficient documentation

## 2014-05-09 DIAGNOSIS — K589 Irritable bowel syndrome without diarrhea: Secondary | ICD-10-CM | POA: Insufficient documentation

## 2014-05-09 DIAGNOSIS — M519 Unspecified thoracic, thoracolumbar and lumbosacral intervertebral disc disorder: Secondary | ICD-10-CM | POA: Insufficient documentation

## 2014-05-09 DIAGNOSIS — Z87891 Personal history of nicotine dependence: Secondary | ICD-10-CM | POA: Insufficient documentation

## 2014-05-09 DIAGNOSIS — F411 Generalized anxiety disorder: Secondary | ICD-10-CM | POA: Insufficient documentation

## 2014-05-09 HISTORY — PX: BUNIONECTOMY WITH HAMMERTOE RECONSTRUCTION: SHX5600

## 2014-05-09 HISTORY — PX: ARTHRODESIS FOOT WITH WEIL OSTEOTOMY: SHX5589

## 2014-05-09 SURGERY — ARTHRODESIS FOOT WITH WEIL OSTEOTOMY
Anesthesia: General | Site: Foot | Laterality: Left

## 2014-05-09 MED ORDER — DEXAMETHASONE SODIUM PHOSPHATE 10 MG/ML IJ SOLN
INTRAMUSCULAR | Status: DC | PRN
  Administered 2014-05-09: 10 mg via INTRAVENOUS

## 2014-05-09 MED ORDER — HYDROMORPHONE HCL PF 1 MG/ML IJ SOLN
INTRAMUSCULAR | Status: AC
  Filled 2014-05-09: qty 1

## 2014-05-09 MED ORDER — SODIUM CHLORIDE 0.9 % IV SOLN: INTRAVENOUS | Status: DC

## 2014-05-09 MED ORDER — CEFAZOLIN SODIUM-DEXTROSE 2-3 GM-% IV SOLR
2.0000 g | INTRAVENOUS | Status: AC
  Administered 2014-05-09: 2 g via INTRAVENOUS

## 2014-05-09 MED ORDER — SCOPOLAMINE 1 MG/3DAYS TD PT72
MEDICATED_PATCH | TRANSDERMAL | Status: AC
  Filled 2014-05-09: qty 1

## 2014-05-09 MED ORDER — MIDAZOLAM HCL 2 MG/2ML IJ SOLN
INTRAMUSCULAR | Status: AC
  Filled 2014-05-09: qty 2

## 2014-05-09 MED ORDER — ONDANSETRON HCL 4 MG/2ML IJ SOLN
4.0000 mg | Freq: Once | INTRAMUSCULAR | Status: AC | PRN
  Administered 2014-05-09: 4 mg via INTRAVENOUS

## 2014-05-09 MED ORDER — LACTATED RINGERS IV SOLN
INTRAVENOUS | Status: DC
  Administered 2014-05-09: 09:00:00 via INTRAVENOUS
  Administered 2014-05-09: 20 mL/h via INTRAVENOUS
  Administered 2014-05-09: 12:00:00 via INTRAVENOUS

## 2014-05-09 MED ORDER — FENTANYL CITRATE 0.05 MG/ML IJ SOLN
INTRAMUSCULAR | Status: AC
  Filled 2014-05-09: qty 6

## 2014-05-09 MED ORDER — MIDAZOLAM HCL 2 MG/2ML IJ SOLN
1.0000 mg | INTRAMUSCULAR | Status: DC | PRN
  Administered 2014-05-09: 2 mg via INTRAVENOUS

## 2014-05-09 MED ORDER — ONDANSETRON HCL 4 MG/2ML IJ SOLN
INTRAMUSCULAR | Status: DC | PRN
  Administered 2014-05-09: 4 mg via INTRAVENOUS

## 2014-05-09 MED ORDER — HYDROMORPHONE HCL 2 MG PO TABS: 2.0000 mg | ORAL_TABLET | ORAL | Status: DC | PRN

## 2014-05-09 MED ORDER — MIDAZOLAM HCL 5 MG/5ML IJ SOLN
INTRAMUSCULAR | Status: DC | PRN
  Administered 2014-05-09: 2 mg via INTRAVENOUS

## 2014-05-09 MED ORDER — LIDOCAINE HCL (CARDIAC) 20 MG/ML IV SOLN
INTRAVENOUS | Status: DC | PRN
Start: 2014-05-09 — End: 2014-05-09
  Administered 2014-05-09: 30 mg via INTRAVENOUS

## 2014-05-09 MED ORDER — SCOPOLAMINE 1 MG/3DAYS TD PT72
1.0000 | MEDICATED_PATCH | TRANSDERMAL | Status: DC
  Administered 2014-05-09: 1.5 mg via TRANSDERMAL

## 2014-05-09 MED ORDER — CEFAZOLIN SODIUM-DEXTROSE 2-3 GM-% IV SOLR
INTRAVENOUS | Status: AC
  Filled 2014-05-09: qty 50

## 2014-05-09 MED ORDER — FENTANYL CITRATE 0.05 MG/ML IJ SOLN
INTRAMUSCULAR | Status: DC | PRN
  Administered 2014-05-09: 100 ug via INTRAVENOUS

## 2014-05-09 MED ORDER — OXYCODONE HCL 5 MG PO TABS
5.0000 mg | ORAL_TABLET | Freq: Once | ORAL | Status: DC | PRN
Start: 2014-05-09 — End: 2014-05-09

## 2014-05-09 MED ORDER — EPHEDRINE SULFATE 50 MG/ML IJ SOLN
INTRAMUSCULAR | Status: DC | PRN
  Administered 2014-05-09 (×4): 10 mg via INTRAVENOUS

## 2014-05-09 MED ORDER — CHLORHEXIDINE GLUCONATE 4 % EX LIQD: 60.0000 mL | Freq: Once | CUTANEOUS | Status: DC

## 2014-05-09 MED ORDER — PROMETHAZINE HCL 12.5 MG PO TABS: 12.5000 mg | ORAL_TABLET | Freq: Four times a day (QID) | ORAL | Status: DC | PRN

## 2014-05-09 MED ORDER — OXYCODONE HCL 5 MG/5ML PO SOLN: 5.0000 mg | Freq: Once | ORAL | Status: DC | PRN

## 2014-05-09 MED ORDER — HYDROMORPHONE HCL PF 1 MG/ML IJ SOLN
0.2500 mg | INTRAMUSCULAR | Status: DC | PRN
  Administered 2014-05-09 (×4): 0.5 mg via INTRAVENOUS

## 2014-05-09 MED ORDER — FENTANYL CITRATE 0.05 MG/ML IJ SOLN
50.0000 ug | INTRAMUSCULAR | Status: DC | PRN
  Administered 2014-05-09: 50 ug via INTRAVENOUS

## 2014-05-09 MED ORDER — FENTANYL CITRATE 0.05 MG/ML IJ SOLN
INTRAMUSCULAR | Status: AC
  Filled 2014-05-09: qty 2

## 2014-05-09 MED ORDER — ASPIRIN EC 325 MG PO TBEC
325.0000 mg | DELAYED_RELEASE_TABLET | Freq: Every day | ORAL | Status: DC
Start: 2014-05-09 — End: 2016-10-11

## 2014-05-09 MED ORDER — PROPOFOL 10 MG/ML IV BOLUS
INTRAVENOUS | Status: DC | PRN
  Administered 2014-05-09: 200 mg via INTRAVENOUS

## 2014-05-09 MED ORDER — ONDANSETRON HCL 4 MG/2ML IJ SOLN
INTRAMUSCULAR | Status: AC
  Filled 2014-05-09: qty 2

## 2014-05-09 MED ORDER — BACITRACIN ZINC 500 UNIT/GM EX OINT
TOPICAL_OINTMENT | CUTANEOUS | Status: DC | PRN
  Administered 2014-05-09: 1 via TOPICAL

## 2014-05-09 SURGICAL SUPPLY — 95 items
BANDAGE ESMARK 6X9 LF (GAUZE/BANDAGES/DRESSINGS) ×1 IMPLANT
BIT DRILL 2.9 CANN QC NONSTRL (BIT) ×3 IMPLANT
BLADE AVERAGE 25MMX9MM (BLADE) ×1
BLADE AVERAGE 25X9 (BLADE) ×2 IMPLANT
BLADE MICRO SAGITTAL (BLADE) ×3 IMPLANT
BLADE MINI RND TIP GREEN BEAV (BLADE) IMPLANT
BLADE OSC/SAG .038X5.5 CUT EDG (BLADE) ×3 IMPLANT
BLADE SURG 15 STRL LF DISP TIS (BLADE) ×2 IMPLANT
BLADE SURG 15 STRL SS (BLADE) ×4
BNDG COHESIVE 4X5 TAN STRL (GAUZE/BANDAGES/DRESSINGS) ×3 IMPLANT
BNDG COHESIVE 6X5 TAN STRL LF (GAUZE/BANDAGES/DRESSINGS) ×3 IMPLANT
BNDG CONFORM 2 STRL LF (GAUZE/BANDAGES/DRESSINGS) IMPLANT
BNDG CONFORM 3 STRL LF (GAUZE/BANDAGES/DRESSINGS) ×3 IMPLANT
BNDG ESMARK 4X9 LF (GAUZE/BANDAGES/DRESSINGS) IMPLANT
BNDG ESMARK 6X9 LF (GAUZE/BANDAGES/DRESSINGS) ×3
CHLORAPREP W/TINT 26ML (MISCELLANEOUS) ×3 IMPLANT
COVER TABLE BACK 60X90 (DRAPES) ×3 IMPLANT
CUFF TOURNIQUET SINGLE 34IN LL (TOURNIQUET CUFF) ×3 IMPLANT
DRAPE C-ARM 42X72 X-RAY (DRAPES) IMPLANT
DRAPE EXTREMITY T 121X128X90 (DRAPE) ×3 IMPLANT
DRAPE INCISE IOBAN 66X45 STRL (DRAPES) IMPLANT
DRAPE OEC MINIVIEW 54X84 (DRAPES) ×3 IMPLANT
DRAPE U-SHAPE 47X51 STRL (DRAPES) ×3 IMPLANT
DRAPE U-SHAPE 76X120 STRL (DRAPES) IMPLANT
DRSG ADAPTIC 3X8 NADH LF (GAUZE/BANDAGES/DRESSINGS) ×3 IMPLANT
DRSG EMULSION OIL 3X3 NADH (GAUZE/BANDAGES/DRESSINGS) IMPLANT
DRSG PAD ABDOMINAL 8X10 ST (GAUZE/BANDAGES/DRESSINGS) ×3 IMPLANT
ELECT REM PT RETURN 9FT ADLT (ELECTROSURGICAL) ×3
ELECTRODE REM PT RTRN 9FT ADLT (ELECTROSURGICAL) ×1 IMPLANT
GAUZE SPONGE 4X4 12PLY STRL (GAUZE/BANDAGES/DRESSINGS) ×3 IMPLANT
GLOVE BIO SURGEON STRL SZ8 (GLOVE) ×3 IMPLANT
GLOVE BIOGEL PI IND STRL 7.0 (GLOVE) ×1 IMPLANT
GLOVE BIOGEL PI IND STRL 7.5 (GLOVE) ×1 IMPLANT
GLOVE BIOGEL PI IND STRL 8 (GLOVE) ×1 IMPLANT
GLOVE BIOGEL PI INDICATOR 7.0 (GLOVE) ×2
GLOVE BIOGEL PI INDICATOR 7.5 (GLOVE) ×2
GLOVE BIOGEL PI INDICATOR 8 (GLOVE) ×2
GLOVE ECLIPSE 6.5 STRL STRAW (GLOVE) ×3 IMPLANT
GLOVE EXAM NITRILE MD LF STRL (GLOVE) ×3 IMPLANT
GLOVE SURG SS PI 7.5 STRL IVOR (GLOVE) ×6 IMPLANT
GOWN STRL REUS W/ TWL LRG LVL3 (GOWN DISPOSABLE) ×2 IMPLANT
GOWN STRL REUS W/ TWL XL LVL3 (GOWN DISPOSABLE) ×1 IMPLANT
GOWN STRL REUS W/TWL LRG LVL3 (GOWN DISPOSABLE) ×4
GOWN STRL REUS W/TWL XL LVL3 (GOWN DISPOSABLE) ×2
K-WIRE .045X4 (WIRE) IMPLANT
K-WIRE .054X4 (WIRE) IMPLANT
K-WIRE ACE 1.6X6 (WIRE) ×6
KIT INSTRUMENT PRO-TOE VO (KITS) ×3 IMPLANT
KWIRE ACE 1.6X6 (WIRE) ×2 IMPLANT
NEEDLE HYPO 22GX1.5 SAFETY (NEEDLE) IMPLANT
NEEDLE HYPO 25X1 1.5 SAFETY (NEEDLE) IMPLANT
NS IRRIG 1000ML POUR BTL (IV SOLUTION) ×3 IMPLANT
PACK BASIN DAY SURGERY FS (CUSTOM PROCEDURE TRAY) ×3 IMPLANT
PAD CAST 4YDX4 CTTN HI CHSV (CAST SUPPLIES) ×1 IMPLANT
PADDING CAST ABS 4INX4YD NS (CAST SUPPLIES) ×2
PADDING CAST ABS COTTON 4X4 ST (CAST SUPPLIES) ×1 IMPLANT
PADDING CAST COTTON 4X4 STRL (CAST SUPPLIES) ×2
PADDING CAST COTTON 6X4 STRL (CAST SUPPLIES) ×3 IMPLANT
PASSER SUT SWANSON 36MM LOOP (INSTRUMENTS) IMPLANT
PENCIL BUTTON HOLSTER BLD 10FT (ELECTRODE) ×3 IMPLANT
PROTOE 2.4 (Toe) ×3 IMPLANT
SANITIZER HAND PURELL 535ML FO (MISCELLANEOUS) ×3 IMPLANT
SCREW ACE CAN 4.0 44M (Screw) ×3 IMPLANT
SCREW CORTICAL 3.5X46MM (Screw) ×3 IMPLANT
SHEET MEDIUM DRAPE 40X70 STRL (DRAPES) ×3 IMPLANT
SLEEVE SCD COMPRESS KNEE MED (MISCELLANEOUS) ×3 IMPLANT
SPLINT FAST PLASTER 5X30 (CAST SUPPLIES) ×40
SPLINT PLASTER CAST FAST 5X30 (CAST SUPPLIES) ×20 IMPLANT
SPONGE LAP 18X18 X RAY DECT (DISPOSABLE) ×3 IMPLANT
STOCKINETTE 6  STRL (DRAPES) ×2
STOCKINETTE 6 STRL (DRAPES) ×1 IMPLANT
SUCTION FRAZIER TIP 10 FR DISP (SUCTIONS) IMPLANT
SUT ETHILON 3 0 FSL (SUTURE) IMPLANT
SUT ETHILON 3 0 PS 1 (SUTURE) ×3 IMPLANT
SUT MERSILENE 2.0 SH NDLE (SUTURE) IMPLANT
SUT MNCRL AB 3-0 PS2 18 (SUTURE) ×3 IMPLANT
SUT MNCRL AB 4-0 PS2 18 (SUTURE) IMPLANT
SUT VIC AB 0 SH 27 (SUTURE) IMPLANT
SUT VIC AB 2-0 CT1 27 (SUTURE)
SUT VIC AB 2-0 CT1 TAPERPNT 27 (SUTURE) IMPLANT
SUT VIC AB 2-0 PS2 27 (SUTURE) IMPLANT
SUT VIC AB 2-0 SH 18 (SUTURE) IMPLANT
SUT VIC AB 2-0 SH 27 (SUTURE) ×2
SUT VIC AB 2-0 SH 27XBRD (SUTURE) ×1 IMPLANT
SUT VIC AB 3-0 PS1 18 (SUTURE)
SUT VIC AB 3-0 PS1 18XBRD (SUTURE) IMPLANT
SYR BULB 3OZ (MISCELLANEOUS) ×3 IMPLANT
SYR CONTROL 10ML LL (SYRINGE) IMPLANT
TOWEL OR 17X24 6PK STRL BLUE (TOWEL DISPOSABLE) ×6 IMPLANT
TOWEL OR NON WOVEN STRL DISP B (DISPOSABLE) IMPLANT
TUBE CONNECTING 20'X1/4 (TUBING) ×1
TUBE CONNECTING 20X1/4 (TUBING) ×2 IMPLANT
TWIST OFF SCREW 12MM (Screw) ×3 IMPLANT
UNDERPAD 30X30 INCONTINENT (UNDERPADS AND DIAPERS) ×3 IMPLANT
YANKAUER SUCT BULB TIP NO VENT (SUCTIONS) IMPLANT

## 2014-05-09 NOTE — Anesthesia Procedure Notes (Addendum)
Anesthesia Regional Block:  Popliteal block  Pre-Anesthetic Checklist: ,, timeout performed, Correct Patient, Correct Site, Correct Laterality, Correct Procedure, Correct Position, site marked, Risks and benefits discussed,  Surgical consent,  Pre-op evaluation,  At surgeon's request and post-op pain management  Laterality: Left and Lower  Prep: chloraprep       Needles:  Injection technique: Single-shot  Needle Type: Echogenic Needle     Needle Length: 9cm 9 cm Needle Gauge: 21 and 21 G    Additional Needles:  Procedures: ultrasound guided (picture in chart) Popliteal block Narrative:  Start time: 05/09/2014 9:10 AM End time: 05/09/2014 9:15 AM Injection made incrementally with aspirations every 5 mL.  Performed by: Personally  Anesthesiologist: Lorrene Reid, MD   Anesthesia Regional Block:  Adductor canal block  Pre-Anesthetic Checklist: ,, timeout performed, Correct Patient, Correct Site, Correct Laterality, Correct Procedure, Correct Position, site marked, Risks and benefits discussed,  Surgical consent,  Pre-op evaluation,  At surgeon's request and post-op pain management  Laterality: Left and Lower  Prep: chloraprep       Needles:  Injection technique: Single-shot  Needle Type: Echogenic Needle     Needle Length: 9cm 9 cm Needle Gauge: 21 and 21 G    Additional Needles:  Procedures: ultrasound guided (picture in chart) Adductor canal block Narrative:  Start time: 05/09/2014 9:26 AM End time: 05/09/2014 9:19 AM Injection made incrementally with aspirations every 5 mL.  Performed by: Personally  Anesthesiologist: Lorrene Reid, MD   Procedure Name: LMA Insertion Date/Time: 05/09/2014 10:33 AM Performed by: BLOCKER, TIMOTHY Pre-anesthesia Checklist: Patient identified, Emergency Drugs available, Suction available and Patient being monitored Patient Re-evaluated:Patient Re-evaluated prior to inductionOxygen Delivery Method: Circle System  Utilized Preoxygenation: Pre-oxygenation with 100% oxygen Intubation Type: IV induction Ventilation: Mask ventilation without difficulty LMA: LMA inserted LMA Size: 4.0 Number of attempts: 1 Airway Equipment and Method: bite block Placement Confirmation: positive ETCO2 Tube secured with: Tape Dental Injury: Teeth and Oropharynx as per pre-operative assessment

## 2014-05-09 NOTE — Anesthesia Postprocedure Evaluation (Signed)
  Anesthesia Post-op Note  Patient: Natasha Wright  Procedure(s) Performed: Procedure(s): LEFT FIRST TARSAL METATARSAL  ARTHRODESIS; LEFT SECOND WEIL OSTEOTOMY (Left) LEFT MODIFIED MCBRIDE BUNIONECTOMY; LEFT SECOND HAMMER TOE CORRECTION (Left)  Patient Location: PACU  Anesthesia Type:GA combined with regional for post-op pain  Level of Consciousness: awake, alert  and oriented  Airway and Oxygen Therapy: Patient Spontanous Breathing  Post-op Pain: mild  Post-op Assessment: Post-op Vital signs reviewed  Post-op Vital Signs: Reviewed  Last Vitals:  Filed Vitals:   05/09/14 1315  BP: 106/65  Pulse: 98  Temp:   Resp: 17    Complications: No apparent anesthesia complications

## 2014-05-09 NOTE — H&P (Signed)
Natasha Wright is an 53 y.o. female.   Chief Complaint:  Left foot pain HPI:  53 y/o female with left hallux valgus, left 2nd hammertoe and left midfoot arthritis.  She has failed nonoperative treatment and presents now for surgery to treat these painful deformities.  Past Medical History  Diagnosis Date  . IBS (irritable bowel syndrome)   . Spinal stenosis in cervical region   . Lumbar disc disease   . B12 deficiency     h/o  . Hypoglycemia   . OA (osteoarthritis)   . Anxiety   . Family history of heart disease   . PONV (postoperative nausea and vomiting)     needs scop patch    Past Surgical History  Procedure Laterality Date  . Colonoscopy  07/08/2003    RMR: Left-sided diverticula, remainder of colonic mucosa and terminal ileum appeared normal.. Minimally friable internal hemorrhoids, otherwise normal rectum  . Abdominal hysterectomy  1992  . Cervical spine surgery  2009  . Colonoscopy N/A 04/30/2013    Procedure: COLONOSCOPY;  Surgeon: Danie Binder, MD;  Location: [facility];  Service: Endoscopy;  Laterality: N/A;  2:15  . Tonsillectomy  1980  . Nm myocar perf wall motion  10/2011    bruce myoview - breast attenuation noted in anterior region; EF 65%; no ischemia/infarct/scar; low risk  . Cholecystectomy  2004    lap choli  . Dilation and curettage of uterus    . Tubal ligation      Family History  Problem Relation Age of Onset  . Colon cancer Neg Hx   . Ulcerative colitis Father   . Diabetes Mother   . Hypertension Mother   . Hyperlipidemia Mother   . Breast cancer Maternal Grandmother   . Hypertension Maternal Grandmother   . Diabetes Maternal Grandmother   . Stroke Maternal Grandmother   . Stroke Maternal Grandfather   . Breast cancer Maternal Grandmother   . Stroke Paternal Grandmother   . Diabetes Paternal Grandmother   . Heart disease Paternal Grandmother   . Heart disease Paternal Grandfather   . Hyperlipidemia Brother   . Hyperlipidemia Sister    . Hypertension Sister   . Diabetes Sister   . Down syndrome Child   . Heart disease Child   . Asthma Maternal Grandmother    Social History:  reports that she quit smoking about 4 months ago. Her smoking use included Cigarettes. She has a 20 pack-year smoking history. She has never used smokeless tobacco. She reports that she does not drink alcohol or use illicit drugs.  Allergies:  Allergies  Allergen Reactions  . Codeine     REACTION: gi upset, skin crawling  . Fentanyl     REACTION: skin crawling, nausea, passed out  . Morphine     REACTION: GI upset, skin crawling  . Nsaids   . Penicillins     REACTION: rash, sob  . Sulfamethoxazole-Trimethoprim     REACTION: rash, sob  . Influenza Vaccines Rash    Medications Prior to Admission  Medication Sig Dispense Refill  . alendronate (FOSAMAX) 70 MG tablet Take 70 mg by mouth once a week. Take with a full glass of water on an empty stomach.      . diazepam (VALIUM) 10 MG tablet Take 10 mg by mouth every 8 (eight) hours as needed (back pain).       [name] estradiol (ESTRACE) 0.5 MG tablet Take 0.5 mg by mouth daily.      [name]  famotidine (PEPCID) 20 MG tablet One at bedtime  39 tablet  2  . loratadine (CLARITIN) 10 MG tablet Take 10 mg by mouth daily.      . pantoprazole (PROTONIX) 40 MG tablet Take 1 tablet (40 mg total) by mouth daily. Take 30-60 min before first meal of the day  30 tablet  2    No results found for this or any previous visit (from the past 48 hour(s)). No results found.  ROS  No recent f/c/nv/wt loss.  Blood pressure 109/61, pulse 79, temperature 98.2 F (36.8 C), temperature source Oral, resp. rate 18, height 5\' 7"  (1.702 m), weight 73.029 kg (161 lb), SpO2 100.00%. Physical Exam  wn wd woman in nad.  A and O x 4.  Mood and affect normal.  EOMi.  resp unlabored.  L foot with TTP at 1st TMT joint.  Left foot with moderat hallux valgus and 2nd hammertoe deformities.  Skin healthy and intact.  Sens to LT intact at the  dorsal and plantar surfaces.  5/5 strength in PF and DF of the ankle and toes.  Brisk cap refill at the toes.  Assessment/Plan Left midfoot arthritis, hallux valgus and 2nd hammertoe deformities.  To OR for Left 1st TMT arthrodesis, modified McBride bunion correction, 2nd MT weil osteotomy and hammertoe correction.  The risks and benefits of the alternative treatment options have been discussed in detail.  The patient wishes to proceed with surgery and specifically understands risks of bleeding, infection, nerve damage, blood clots, need for additional surgery, amputation and death.   HEWITTJenny Reichmann May 20, 2014, 10:10 AM

## 2014-05-09 NOTE — Discharge Instructions (Signed)
Natasha Simmer, MD [place]  Please read the following information regarding your care after surgery.  Medications  You only need a prescription for the narcotic pain medicine (ex. oxycodone, Percocet, Norco).  All of the other medicines listed below are available over the counter. X acetominophen (Tylenol) 650 mg every 4-6 hours as you need for minor pain X Dilaudid as prescribed for moderate to severe pain X Phenergan as prescribed for nausea   Narcotic pain medicine (ex. oxycodone, Percocet, Vicodin) will cause constipation.  To prevent this problem, take the following medicines while you are taking any pain medicine. X docusate sodium (Colace) 100 mg twice a day X senna (Senokot) 2 tablets twice a day  X To help prevent blood clots, take an aspirin (325 mg) once a day for a month after surgery.  You should also get up every hour while you are awake to move around.    Weight Bearing ? Bear weight when you are able on your operated leg or foot. ? Bear weight only on the heel of your operated foot in the post-op shoe. X Do not bear any weight on the operated leg or foot.  Cast / Splint / Dressing X Keep your splint or cast clean and dry.  Dont put anything (coat hanger, pencil, etc) down inside of it.  If it gets damp, use a hair dryer on the cool setting to dry it.  If it gets soaked, call the office to schedule an appointment for a cast change. ? Remove your dressing 3 days after surgery and cover the incisions with dry dressings.    After your dressing, cast or splint is removed; you may shower, but do not soak or scrub the wound.  Allow the water to run over it, and then gently pat it dry.  Swelling It is normal for you to have swelling where you had surgery.  To reduce swelling and pain, keep your toes above your nose for at least 3 days after surgery.  It may be necessary to keep your foot or leg elevated for several weeks.  If it hurts, it should be  elevated.  Follow Up Call my office at [phone] when you are discharged from the hospital or surgery center to schedule an appointment to be seen two weeks after surgery.  Call my office at [phone] if you develop a fever >101.5 F, nausea, vomiting, bleeding from the surgical site or severe pain.    Regional Anesthesia Blocks  1. Numbness or the inability to move the "blocked" extremity may last from 3-48 hours after placement. The length of time depends on the medication injected and your individual response to the medication. If the numbness is not going away after 48 hours, call your surgeon.  2. The extremity that is blocked will need to be protected until the numbness is gone and the  Strength has returned. Because you cannot feel it, you will need to take extra care to avoid injury. Because it may be weak, you may have difficulty moving it or using it. You may not know what position it is in without looking at it while the block is in effect.  3. For blocks in the legs and feet, returning to weight bearing and walking needs to be done carefully. You will need to wait until the numbness is entirely gone and the strength has returned. You should be able to move your leg and foot normally before you try and bear weight or walk. You will  need someone to be with you when you first try to ensure you do not fall and possibly risk injury.  4. Bruising and tenderness at the needle site are common side effects and will resolve in a few days.  5. Persistent numbness or new problems with movement should be communicated to the surgeon or the [place] [phone] [place] [phone]).  Post Anesthesia Home Care Instructions  Activity: Get plenty of rest for the remainder of the day. A responsible adult should stay with you for 24 hours following the procedure.  For the next 24 hours, DO NOT: -Drive a car -Paediatric nurse -Drink alcoholic  beverages -Take any medication unless instructed by your physician -Make any legal decisions or sign important papers.  Meals: Start with liquid foods such as gelatin or soup. Progress to regular foods as tolerated. Avoid greasy, spicy, heavy foods. If nausea and/or vomiting occur, drink only clear liquids until the nausea and/or vomiting subsides. Call your physician if vomiting continues.  Special Instructions/Symptoms: Your throat may feel dry or sore from the anesthesia or the breathing tube placed in your throat during surgery. If this causes discomfort, gargle with warm salt water. The discomfort should disappear within 24 hours.

## 2014-05-09 NOTE — Transfer of Care (Signed)
Immediate Anesthesia Transfer of Care Note  Patient: Natasha Wright  Procedure(s) Performed: Procedure(s): LEFT FIRST TARSAL METATARSAL  ARTHRODESIS; LEFT SECOND WEIL OSTEOTOMY (Left) LEFT MODIFIED MCBRIDE BUNIONECTOMY; LEFT SECOND HAMMER TOE CORRECTION (Left)  Patient Location: PACU  Anesthesia Type:General and GA combined with regional for post-op pain  Level of Consciousness: awake  Airway & Oxygen Therapy: Patient Spontanous Breathing and Patient connected to face mask oxygen  Post-op Assessment: Report given to PACU RN and Post -op Vital signs reviewed and stable  Post vital signs: Reviewed and stable  Complications: No apparent anesthesia complications

## 2014-05-09 NOTE — Anesthesia Preprocedure Evaluation (Signed)
Anesthesia Evaluation  Patient identified by MRN, date of birth, ID band Patient awake    Reviewed: Allergy & Precautions, H&P , NPO status , Patient's Chart, lab work & pertinent test results  Airway Mallampati: I TM Distance: >3 FB Neck ROM: Full    Dental  (+) Teeth Intact, Dental Advisory Given   Pulmonary former smoker,  breath sounds clear to auscultation        Cardiovascular Rhythm:Regular Rate:Normal     Neuro/Psych    GI/Hepatic   Endo/Other    Renal/GU      Musculoskeletal   Abdominal   Peds  Hematology   Anesthesia Other Findings   Reproductive/Obstetrics                           Anesthesia Physical Anesthesia Plan  ASA: II  Anesthesia Plan: General   Post-op Pain Management:    Induction: Intravenous  Airway Management Planned: LMA  Additional Equipment:   Intra-op Plan:   Post-operative Plan: Extubation in OR  Informed Consent: I have reviewed the patients History and Physical, chart, labs and discussed the procedure including the risks, benefits and alternatives for the proposed anesthesia with the patient or authorized representative who has indicated his/her understanding and acceptance.   Dental advisory given  Plan Discussed with: CRNA, Anesthesiologist and Surgeon  Anesthesia Plan Comments:         Anesthesia Quick Evaluation

## 2014-05-09 NOTE — Progress Notes (Signed)
Assisted Dr. Crews with left, ultrasound guided, popliteal/saphenous block. Side rails up, monitors on throughout procedure. See vital signs in flow sheet. Tolerated Procedure well. 

## 2014-05-09 NOTE — Telephone Encounter (Signed)
I called and left a message for the patient that I will send her sleep report and sleep diary out today and once she receive the packet to callback to schedule her follow up  appointment.

## 2014-05-09 NOTE — Op Note (Signed)
Natasha Wright, Natasha Wright            ACCOUNT NO.:  [id number]  MEDICAL RECORD NO.:  [id number]  LOCATION:                                 FACILITY:  PHYSICIAN:  Wylene Simmer, MD        DATE OF BIRTH:  11/24/1960  DATE OF PROCEDURE:  05/09/2014 DATE OF DISCHARGE:                              OPERATIVE REPORT   PREOPERATIVE DIAGNOSIS: 1. Left first tarsometatarsal joint arthritis. 2. Left hallux valgus. 3. Left second hammertoe deformity.  POSTOPERATIVE DIAGNOSIS: 1. Left first tarsometatarsal joint arthritis. 2. Left hallux valgus. 3. Left second hammertoe deformity.  PROCEDURE: 1. Left first tarsometatarsal joint arthrodesis. 2. Left modified McBride bunionectomy through separate incision. 3. Left second metatarsal Weil osteotomy through separate incision. 4. Left second hammertoe correction through a separate incision. 5. Left second MTP joint dorsal capsulotomy and extensor tendon     lengthening. 6. Three view radiographs of the left foot.  SURGEON:  Wylene Simmer, MD.  ANESTHESIA:  General, regional.  ESTIMATED BLOOD LOSS:  Minimal.  TOURNIQUET TIME:  66 minutes at 220 mmHg.  COMPLICATIONS:  None apparent.  DISPOSITION:  Extubated, awake, and stable to recovery.  INDICATIONS FOR PROCEDURE:  The patient is a 53 year old woman, who complains of a painful left foot bunion deformity as well as second hammertoe and midfoot arthritis.  She has failed nonoperative treatment to date including activity modification, oral anti-inflammatories, Shoe- wear modification and arch supports.  She presents now for operative treatment for this painful condition.  She understands the risks and benefits, the alternative treatment options and elects surgical treatment.  She specifically understands risks of bleeding, infection, nerve damage, blood clots, need for additional surgery, and recurrence of her deformity, amputation, and death.  PROCEDURE IN DETAIL:  After preoperative  consent was obtained, the correct operative site was identified.  The patient was brought to the operating room and placed supine on the operating table.  General anesthesia was induced.  Preoperative antibiotics were administered. Surgical time-out was taken.  The left lower extremity was prepped and draped in standard sterile fashion with tourniquet around the thigh. The extremity was exsanguinated and tourniquet was inflated to 220 mmHg. A longitudinal incision was then made at the dorsum of the first webspace.  Sharp dissection was carried down through skin and subcutaneous tissue.  The intermetatarsal ligament was identified and was divided under direct vision.  The arthrotomy was then made between the lateral sesamoid and the first metatarsal head.  Several perforations were made in the lateral joint capsule adjacent to the MTP joint.  The hallux then be positioned in approximately 20 degrees of varus passively. Attention was then turned to the medial eminence where a longitudinal incision was made.  Sharp dissection was carried down through the skin and subcutaneous tissue.  The joint capsule was incised in line with its fibers and elevated plantarly and dorsally.  The medial eminence was noted to be hypertrophic.  An oscillating saw was used to resect the hypertrophic medial eminence in line with the first metatarsal shaft. The wound was irrigated copiously.  Attention was then turned to the dorsum of the foot where a longitudinal incision was made over the first TMT  joint.  Sharp dissection was carried down through skin and subcutaneous tissue.  The dorsal joint capsule of the first TMT joint was incised and elevated medially and laterally.  An oscillating saw was then used to resect both sides of the joint removing the articular cartilage and subchondral bone as well as all peripheral osteophytes.  The joint was reduced and provisionally pinned with K-wires.  AP and lateral  radiographs confirmed appropriate alignment of the joint as well as appropriate alignment of the first metatarsal.  The joint previously had been irrigated and a perforated on both sides with a 2.5-mm drill bit leaving the resultant bone graft in place.  The joint was then fixed with a 4-mm cannulated screw placed over the guidewire from proximal to distal.  This compressed the joint appropriately.  The second guide pin was then over drilled with a 2.9 mm cannulated drill bit.  A 3.5 mm low-profile solid screw was inserted after removing the guide pin.  Both screws were noted to have appropriate purchase and were from the [employer] small frag set.  Attention was then turned to the forefoot where the second metatarsal head was exposed.  A Weil osteotomy was made with the oscillating saw removing a small wedge of bone distally.  The head was allowed to retract couple of millimeters proximally and was fixed with a 2 mm cancellous screw.  The overhanging bone was trimmed with a rongeur.  The toe was still noted to be tight at the dorsal joint capsule, so this was excised in its entirety.  The extensor digitorum brevis and longus tendons were lengthened in Z-fashion as well.  This allowed correction of the toe to a neutral position.  Transverse incision was then made over the PIP joint.  Sharp dissection was carried down through skin and subcutaneous tissue in the extensor mechanism.  The collaterals were released.  The head of the proximal phalanx and base of the middle phalanx were resected with the oscillating saw.  The [facility] medical Pro-Toe implant was then inserted. The toe was reduced over the implant and compressed appropriately. Final AP and lateral radiographs of both the proximal and distal operative sites were performed.  These showed interval correction of the hallux valgus and second hammertoe deformities as well as arthrodesis of the first TMT joint.  All wounds were then  irrigated copiously.  The extensor tendons of the second toe were repaired with 2-0 Vicryl sutures.  The medial joint capsule was imbricated with 2-0 Vicryl sutures.  Subcutaneous tissue of all incisions was closed with inverted simple sutures of 3-0 Monocryl and running and horizontal mattress 3-0 nylon sutures were used to close the skin incisions.  Sterile dressings were applied followed by a bunion wrapped in a short-leg splint. Tourniquet was released at 65 minutes after application of the dressings.  The patient was awakened by anesthesia and transported to recovery room in stable condition.  FOLLOWUP PLAN:  The patient will be nonweightbearing on the left lower extremity.  She will follow up with me in 2 weeks for suture removal and conversion to a cast.  RADIOGRAPHS:  AP and lateral radiographs of the left foot are obtained at the forefoot as well as the midfoot.  These 4 radiographic views show interval correction of the bunion deformity as well as the first TMT joint arthritis and second hammertoe.  The hardware is all appropriately positioned at the appropriate length.  No fracture or dislocation is noted.     Wylene Simmer, MD  JH/MEDQ  D:  05/09/2014  T:  05/09/2014  Job:  [id number]

## 2014-05-09 NOTE — Brief Op Note (Signed)
05/09/2014  12:15 PM  PATIENT:  Natasha Wright  53 y.o. female  PRE-OPERATIVE DIAGNOSIS:  LEFT FIRST TARSAL METATARSAL ARTHRITIS; LEFT HALLUS VALGUS; LEFT SECOND HAMMER TOE  POST-OPERATIVE DIAGNOSIS:  LEFT FIRST TARSAL METATARSAL ARTHRITIS; LEFT HALLUS VALGUS; LEFT SECOND HAMMER TOE  Procedure(s): 1.  Left 1st TMT arthrodesis 2.  Left modified McBride bunionectomy through separate incisions 3.  Left 2nd MT Weil osteotomy through a separate incision 4.  Left 2nd hammertoe correction 5.  Left 2nd MTPJ dorsal capsulotomy and extensor tendon lengthening 6.  3 view radiographs of the left foot  SURGEON:  Wylene Simmer, MD  ASSISTANT: n/a  ANESTHESIA:   General, regional  EBL:  minimal   TOURNIQUET:   Total Tourniquet Time Documented: Thigh (Left) - 66 minutes Total: Thigh (Left) - 66 minutes   COMPLICATIONS:  None apparent  DISPOSITION:  Extubated, awake and stable to recovery.  DICTATION ID:  [id number]

## 2014-05-10 ENCOUNTER — Encounter (HOSPITAL_BASED_OUTPATIENT_CLINIC_OR_DEPARTMENT_OTHER): Payer: Self-pay | Admitting: Orthopedic Surgery

## 2014-07-12 ENCOUNTER — Ambulatory Visit (INDEPENDENT_AMBULATORY_CARE_PROVIDER_SITE_OTHER): Payer: Medicare HMO | Admitting: Neurology

## 2014-07-12 ENCOUNTER — Encounter: Payer: Self-pay | Admitting: Neurology

## 2014-07-12 VITALS — BP 124/79 | HR 89 | Temp 98.1°F | Ht 65.0 in | Wt 164.0 lb

## 2014-07-12 DIAGNOSIS — M62838 Other muscle spasm: Secondary | ICD-10-CM

## 2014-07-12 DIAGNOSIS — G479 Sleep disorder, unspecified: Secondary | ICD-10-CM

## 2014-07-12 DIAGNOSIS — G8929 Other chronic pain: Secondary | ICD-10-CM

## 2014-07-12 NOTE — Progress Notes (Signed)
Subjective:    Patient ID: Natasha Wright is a 53 y.o. female.  HPI    Interim history:   Natasha Wright is a 53 year old right-handed woman with an underlying medical history of allergic rhinitis, IBS, arthritis, chronic low back pain and neck pain, fibromyalgia, migraines, hypoglycemia, macular degeneration, chest pain, carpal tunnel syndrome, and chronic pain, who presents for followup consultation after her recent baseline sleep study. I first met her on 03/13/2014 at the request of her primary care physician at which time she reported waking up with a gasping sensation and she had had an overnight pulse oximetry on 02/11/2014 which showed abnormal readings. She had recently seen pulmonology as well as cardiology and was diagnosed with reflux disease but no COPD and no ischemic heart disease. She reported pain at night and multiple nighttime awakenings. She was recently diagnosed with B12 deficiency and started on B12 injections. I suggested she return for sleep study. She had a baseline sleep study on 04/18/2014 and I went over her sleep test results with her in detail today. Her sleep efficiency was mildly reduced at 81.2% with a latency to sleep 23 minutes and wake after sleep onset of 24.5 minutes. She is in normal arousal index. She had a markedly increased percentage of stage II sleep at 89.1%, absence of deep sleep and a markedly decreased percentage of REM sleep at 5.6% with a prolonged REM latency of 254.5 minutes. She had no significant periodic leg movements or EKG changes. She had bruxism. No significant snoring was noted. She slept mostly in the supine position. Total AHI was 1.5 per hour. Baseline oxygen saturation was 95% with a nadir of 91%. I felt that the increase in fast activity in the beta range and the increase in stage II sleep was primarily secondary to medication effect from benzodiazepine. In the interim on 05/09/2014 she had left knee and and hammertoe surgery. Today, she  reports having residual L foot pain. She has hardware and had multiple stiches, and is still in pain. She has been seeing Dr. Ace Gins for her pain. She sometimes naps after lunch for about an hour. She takes valium for muscle spasms per Dr. Ace Gins. She lives with her husband her son, 18.    Her Past Medical History Is Significant For: Past Medical History  Diagnosis Date  . IBS (irritable bowel syndrome)   . Spinal stenosis in cervical region   . Lumbar disc disease   . B12 deficiency     h/o  . Hypoglycemia   . OA (osteoarthritis)   . Anxiety   . Family history of heart disease   . PONV (postoperative nausea and vomiting)     needs scop patch   Her Past Surgical History Is Significant For: Past Surgical History  Procedure Laterality Date  . Colonoscopy  07/08/2003    RMR: Left-sided diverticula, remainder of colonic mucosa and terminal ileum appeared normal.. Minimally friable internal hemorrhoids, otherwise normal rectum  . Abdominal hysterectomy  1992  . Cervical spine surgery  2009  . Colonoscopy N/A 04/30/2013    Procedure: COLONOSCOPY;  Surgeon: Danie Binder, MD;  [facility]: [facility];  Service: Endoscopy;  Laterality: N/A;  2:15  . Tonsillectomy  1980  . Nm myocar perf wall motion  10/2011    bruce myoview - breast attenuation noted in anterior region; EF 65%; no ischemia/infarct/scar; low risk  . Cholecystectomy  2004    lap choli  . Dilation and curettage of uterus    .  Tubal ligation    . Arthrodesis foot with weil osteotomy Left 05/09/2014    Procedure: LEFT FIRST TARSAL METATARSAL  ARTHRODESIS; LEFT SECOND WEIL OSTEOTOMY;  Surgeon: Wylene Simmer, MD;  Location: [facility];  Service: Orthopedics;  Laterality: Left;  . Bunionectomy with hammertoe reconstruction Left 05/09/2014    Procedure: LEFT MODIFIED MCBRIDE BUNIONECTOMY; LEFT SECOND HAMMER TOE CORRECTION;  Surgeon: Wylene Simmer, MD;  Location: [place];  Service: Orthopedics;   Laterality: Left;   Her Family History Is Significant For: Family History  Problem Relation Age of Onset  . Colon cancer Neg Hx   . Ulcerative colitis Father   . Diabetes Mother   . Hypertension Mother   . Hyperlipidemia Mother   . Breast cancer Maternal Grandmother   . Hypertension Maternal Grandmother   . Diabetes Maternal Grandmother   . Stroke Maternal Grandmother   . Stroke Maternal Grandfather   . Breast cancer Maternal Grandmother   . Stroke Paternal Grandmother   . Diabetes Paternal Grandmother   . Heart disease Paternal Grandmother   . Heart disease Paternal Grandfather   . Hyperlipidemia Brother   . Hyperlipidemia Sister   . Hypertension Sister   . Diabetes Sister   . Down syndrome Child   . Heart disease Child   . Asthma Maternal Grandmother     Her Social History Is Significant For: History   Social History  . Marital Status: Married    Spouse Name: Natasha Wright    Number of Children: 1  . Years of Education: 14   Occupational History  . Unemployed   .     Social History Main Topics  . Smoking status: Former Smoker -- 0.50 packs/day for 40 years    Types: Cigarettes    Quit date: 12/24/2013  . Smokeless tobacco: Never Used     Comment: using vape as aid to sustain from smoking  . Alcohol Use: No  . Drug Use: No  . Sexual Activity: None   Other Topics Concern  . None   Social History Narrative   One son, Down syndrome,is right handed ,resides with son and husband    Her Allergies Are:  Allergies  Allergen Reactions  . Codeine     REACTION: gi upset, skin crawling  . Fentanyl     REACTION: skin crawling, nausea, passed out  . Morphine     REACTION: GI upset, skin crawling  . Nsaids   . Penicillins     REACTION: rash, sob  . Sulfamethoxazole-Trimethoprim     REACTION: rash, sob  . Influenza Vaccines Rash  :   Her Current Medications Are:  Outpatient Encounter Prescriptions as of 07/12/2014  Medication Sig  . alendronate (FOSAMAX) 70 MG  tablet Take 70 mg by mouth once a week. Take with a full glass of water on an empty stomach.  [name] aspirin EC 325 MG tablet Take 1 tablet (325 mg total) by mouth daily.  . diazepam (VALIUM) 10 MG tablet Take 10 mg by mouth every 8 (eight) hours as needed (back pain).   [name] estradiol (ESTRACE) 0.5 MG tablet Take 0.5 mg by mouth daily.  . famotidine (PEPCID) 20 MG tablet One at bedtime  . HYDROmorphone (DILAUDID) 2 MG tablet Take 1 tablet (2 mg total) by mouth every 4 (four) hours as needed for severe pain.  [name] loratadine (CLARITIN) 10 MG tablet Take 10 mg by mouth daily.  . pantoprazole (PROTONIX) 40 MG tablet Take 1 tablet (40 mg total)  by mouth daily. Take 30-60 min before first meal of the day  . promethazine (PHENERGAN) 12.5 MG tablet Take 1 tablet (12.5 mg total) by mouth every 6 (six) hours as needed for nausea or vomiting.  :  Review of Systems:  Out of a complete 14 point review of systems, all are reviewed and negative with the exception of these symptoms as listed below:   Review of Systems  Constitutional: Positive for fatigue.  HENT: Positive for trouble swallowing.   Eyes:       Light sensitivity  Endocrine: Positive for cold intolerance.  Genitourinary:       Diarrhea  Musculoskeletal: Positive for back pain, joint swelling, neck pain and neck stiffness.       Joint pain, aching muscles,muscle cramps, walking difficulty  Allergic/Immunologic: Positive for environmental allergies.  Neurological: Positive for headaches.       Restless leg, insomnia,frequent waking, daytime sleepiness    Objective:  Neurologic Exam  Physical Exam Physical Examination:   Filed Vitals:   07/12/14 0905  BP: 124/79  Pulse: 89  Temp: 98.1 F (36.7 C)    General Examination: The patient is a very pleasant 53 y.o. female in no acute distress. She appears well-developed and well-nourished and very well groomed.   HEENT: Normocephalic, atraumatic, pupils are equal, round and reactive to light  and accommodation. Funduscopic exam is normal with sharp disc margins noted. Extraocular tracking is good without limitation to gaze excursion or nystagmus noted. Normal smooth pursuit is noted. Hearing is grossly intact. Face is symmetric with normal facial animation and normal facial sensation. Speech is clear with no dysarthria noted. There is no hypophonia. There is no lip, neck/head, jaw or voice tremor. Neck is stiff and she has significant decrease in range of passive and active motion, unchanged. There are no carotid bruits on auscultation. Oropharynx exam reveals: mild mouth dryness, good dental hygiene and mild airway crowding and posterior pharynx and uvula are mildly irritated looking.Tonsils are absent.    Chest: Clear to auscultation without wheezing, rhonchi or crackles noted.  Heart: S1+S2+0, regular and normal without murmurs, rubs or gallops noted.   Abdomen: Soft, non-tender and non-distended with normal bowel sounds appreciated on auscultation.  Extremities: There is no pitting edema in the distal lower extremities bilaterally. Pedal pulses are intact.  Skin: Warm and dry without trophic changes noted. There are no varicose veins.  Musculoskeletal: exam reveals no obvious joint deformities, tenderness or joint swelling or erythema, except for tenderness in her thump joints with mild swelling and L foot puffiness.   Neurologically:  Mental status: The patient is awake, alert and oriented in all 4 spheres. Her immediate and remote memory, attention, language skills and fund of knowledge are appropriate. There is no evidence of aphasia, agnosia, apraxia or anomia. Speech is clear with normal prosody and enunciation. Thought process is linear. Mood is normal and affect is normal.  Cranial nerves II - XII are as described above under HEENT exam. In addition: shoulder shrug is normal with equal shoulder height noted. Motor exam: Normal bulk, strength and tone is noted. There is no  drift, tremor or rebound. Reflexes are 2+ throughout, L leg not tested. Fine motor skills and coordination: intact in the UEs.   Cerebellar testing: No dysmetria or intention tremor on finger to nose testing. Heel to shin is unremarkable bilaterally. There is no truncal or gait ataxia.  Sensory exam: intact to light touch in the upper and lower extremities.  Gait, station  and balance: She stands easily. No veering to one side is noted. No leaning to one side is noted. Posture is age-appropriate and stance is narrow based. Gait: She stands up slowly. She wears a Velcro boot up to her knee area on the left. She walks well with that she has of course a slight limp. She turns in 3 steps. Tandem walk is not tested today.                Assessment and Plan:   In summary, Yahira Timberman is a very pleasant 53 y.o.-year old female with a history of with an underlying medical history of allergic rhinitis, IBS, arthritis, chronic low back pain and neck pain, fibromyalgia, migraines, hypoglycemia, macular degeneration, chest pain, carpal tunnel syndrome, and chronic pain, s/p recent L foot surgery, who presents for followup consultation after her sleep study. She does not have sleep disordered breathing, no PLMD, no hypoxemia. She does not sleep well. She has been taking Valium for muscle spasms and it helps her sleep some. I explained to her the sleep study results in detail today and advised her that thankfully we do not have to treat her for OSA. Unfortunately, she has pain which prevents her from sleeping in a certain position and she has residual foot pain from her recent surgery some 9 weeks ago. At this juncture, I suggested she followup with her primary care physician and her pain management doctor. From the sleep standpoint we do not have to treat her for an organic sleep disorder. Restless leg syndrome is mild in her case as her symptoms of restlessness are mild but she primarily has pain for which he is  under management. She may need neck surgery she states. She has an appointment for evaluation soon with Dr. Carloyn Manner. She was in agreement.

## 2014-07-12 NOTE — Patient Instructions (Signed)
Please remember to try to maintain good sleep hygiene, which means: Keep a regular sleep and wake schedule, try not to exercise or have a meal within 2 hours of your bedtime, try to keep your bedroom conducive for sleep, that is, cool and dark, without light distractors such as an illuminated alarm clock, and refrain from watching TV right before sleep or in the middle of the night and do not keep the TV or radio on during the night. Also, try not to use or play on electronic devices at bedtime, such as your cell phone, tablet PC or laptop. If you like to read at bedtime on an electronic device, try to dim the background light as much as possible. Do not eat in the middle of the night.     

## 2014-12-17 ENCOUNTER — Other Ambulatory Visit: Payer: Self-pay

## 2014-12-17 DIAGNOSIS — Z1231 Encounter for screening mammogram for malignant neoplasm of breast: Secondary | ICD-10-CM

## 2014-12-20 ENCOUNTER — Ambulatory Visit
Admission: RE | Admit: 2014-12-20 | Discharge: 2014-12-20 | Disposition: A | Discharge status: Home / Self Care | Payer: 59 | Source: Ambulatory Visit

## 2014-12-20 DIAGNOSIS — Z1231 Encounter for screening mammogram for malignant neoplasm of breast: Secondary | ICD-10-CM

## 2015-08-13 ENCOUNTER — Ambulatory Visit (INDEPENDENT_AMBULATORY_CARE_PROVIDER_SITE_OTHER): Payer: 59 | Admitting: Urology

## 2015-08-13 DIAGNOSIS — N8184 Pelvic muscle wasting: Secondary | ICD-10-CM

## 2015-08-13 DIAGNOSIS — N362 Urethral caruncle: Secondary | ICD-10-CM

## 2015-08-13 DIAGNOSIS — R3915 Urgency of urination: Secondary | ICD-10-CM | POA: Diagnosis not present

## 2015-09-10 ENCOUNTER — Ambulatory Visit (INDEPENDENT_AMBULATORY_CARE_PROVIDER_SITE_OTHER): Payer: 59 | Admitting: Urology

## 2015-09-10 DIAGNOSIS — N3010 Interstitial cystitis (chronic) without hematuria: Secondary | ICD-10-CM

## 2015-09-10 DIAGNOSIS — R3915 Urgency of urination: Secondary | ICD-10-CM | POA: Diagnosis not present

## 2015-09-10 DIAGNOSIS — N8184 Pelvic muscle wasting: Secondary | ICD-10-CM

## 2015-12-05 ENCOUNTER — Other Ambulatory Visit: Payer: Self-pay

## 2015-12-05 DIAGNOSIS — Z1231 Encounter for screening mammogram for malignant neoplasm of breast: Secondary | ICD-10-CM

## 2015-12-29 ENCOUNTER — Ambulatory Visit
Admission: RE | Admit: 2015-12-29 | Discharge: 2015-12-29 | Disposition: A | Discharge status: Home / Self Care | Payer: BLUE CROSS/BLUE SHIELD | Source: Ambulatory Visit

## 2015-12-29 DIAGNOSIS — Z1231 Encounter for screening mammogram for malignant neoplasm of breast: Secondary | ICD-10-CM

## 2016-02-04 ENCOUNTER — Other Ambulatory Visit: Payer: Self-pay | Admitting: Neurosurgery

## 2016-02-04 DIAGNOSIS — M545 Low back pain: Secondary | ICD-10-CM

## 2016-02-14 ENCOUNTER — Ambulatory Visit
Admission: RE | Admit: 2016-02-14 | Discharge: 2016-02-14 | Disposition: A | Discharge status: Home / Self Care | Payer: BLUE CROSS/BLUE SHIELD | Source: Ambulatory Visit | Attending: Neurosurgery | Admitting: Neurosurgery

## 2016-02-14 DIAGNOSIS — M545 Low back pain: Secondary | ICD-10-CM

## 2016-03-29 ENCOUNTER — Other Ambulatory Visit: Payer: Self-pay | Admitting: Internal Medicine

## 2016-03-29 DIAGNOSIS — M810 Age-related osteoporosis without current pathological fracture: Secondary | ICD-10-CM

## 2016-04-13 ENCOUNTER — Ambulatory Visit
Admission: RE | Admit: 2016-04-13 | Discharge: 2016-04-13 | Disposition: A | Discharge status: Home / Self Care | Payer: BLUE CROSS/BLUE SHIELD | Source: Ambulatory Visit | Attending: Internal Medicine | Admitting: Internal Medicine

## 2016-04-13 DIAGNOSIS — M810 Age-related osteoporosis without current pathological fracture: Secondary | ICD-10-CM

## 2016-09-30 ENCOUNTER — Encounter: Payer: Self-pay | Admitting: Gastroenterology

## 2016-09-30 ENCOUNTER — Other Ambulatory Visit (HOSPITAL_COMMUNITY): Payer: Self-pay | Admitting: Registered Nurse

## 2016-09-30 DIAGNOSIS — N816 Rectocele: Secondary | ICD-10-CM

## 2016-09-30 DIAGNOSIS — R1031 Right lower quadrant pain: Secondary | ICD-10-CM

## 2016-10-11 ENCOUNTER — Encounter: Payer: Self-pay | Admitting: Gastroenterology

## 2016-10-11 ENCOUNTER — Ambulatory Visit (INDEPENDENT_AMBULATORY_CARE_PROVIDER_SITE_OTHER): Payer: BLUE CROSS/BLUE SHIELD | Admitting: Gastroenterology

## 2016-10-11 ENCOUNTER — Ambulatory Visit (HOSPITAL_COMMUNITY): Payer: BLUE CROSS/BLUE SHIELD

## 2016-10-11 VITALS — BP 123/76 | HR 96 | Temp 98.0°F | Ht 67.0 in | Wt 170.8 lb

## 2016-10-11 DIAGNOSIS — K5900 Constipation, unspecified: Secondary | ICD-10-CM

## 2016-10-11 NOTE — Assessment & Plan Note (Signed)
55 year old female with constipation, paper hematochezia in setting of straining, with last colonoscopy in 2014 noting internal hemorrhoids. This is likely the culprit for low-volume hematochezia. Query pelvic floor dysfunction as well. Will trial Amitiza 8 mcg to start and provide 24 mcg samples when available. Patient to let us know what works best. Would avoid Linzess unless failing Amitiza, as I am concerned about the possible diarrhea side effect. Return in 6 weeks for close follow-up. If no improvement with abdominal pain or persistent hematochezia, low threshold for early interval colonoscopy.

## 2016-10-11 NOTE — Progress Notes (Signed)
  Primary Care Physician:  FUSCO,LAWRENCE J., MD Primary Gastroenterologist:  Dr. Fields   Chief Complaint  Patient presents with  . Abdominal Pain    x6 months, getting worse  . Constipation    has rectocele-recent diagnosis    HPI:   Natasha Wright is a 55 y.o. female presenting today at the request of her PCP secondary to abdominal pain. Last colonoscopy in 2014 with diverticulosis and internal hemorrhoids. Due again in 2024. Recent labs completed through PCP: CBC, LFTs, normal. History of IBS, diarrhea predominant. Now presenting with constipation.   CT ordered through PCP but patient states her out of pocket expense would be "2000$". She has put this on hold. States she was diagnosed with a rectocele. Dealing with significant constipation. Has stool like rocks. Has pelvic floor dysfunction per patient. Constipation on and off for 6 months but worsened over the last few weeks. Paper hematochezia. Laxative causes cramping, abdominal pain. Taking 6-8 stool softeners and fiber tablets. Going 3-4 days without a BM. Has abdominal discomfort, bloating. No weight loss but has noted weight gain. Pain located lower abdomen.   Woke up in the middle of the last procedure per patient.    Past Medical History:  Diagnosis Date  . Anxiety   . B12 deficiency    h/o  . Family history of heart disease   . GERD (gastroesophageal reflux disease)   . Hypoglycemia   . IBS (irritable bowel syndrome)   . Lumbar disc disease   . OA (osteoarthritis)   . PONV (postoperative nausea and vomiting)    needs scop patch  . Rectocele   . Spinal stenosis in cervical region     Past Surgical History:  Procedure Laterality Date  . ABDOMINAL HYSTERECTOMY  1992  . ARTHRODESIS FOOT WITH WEIL OSTEOTOMY Left 05/09/2014   Procedure: LEFT FIRST TARSAL METATARSAL  ARTHRODESIS; LEFT SECOND WEIL OSTEOTOMY;  Surgeon: John Hewitt, MD;  Location: [facility];  Service: Orthopedics;  Laterality: Left;    . BUNIONECTOMY WITH HAMMERTOE RECONSTRUCTION Left 05/09/2014   Procedure: LEFT MODIFIED MCBRIDE BUNIONECTOMY; LEFT SECOND HAMMER TOE CORRECTION;  Surgeon: John Hewitt, MD;  [facility];  Service: Orthopedics;  Laterality: Left;  . CERVICAL SPINE SURGERY  2009  . CHOLECYSTECTOMY  2004   lap choli  . COLONOSCOPY  07/08/2003   RMR: Left-sided diverticula, remainder of colonic mucosa and terminal ileum appeared normal.. Minimally friable internal hemorrhoids, otherwise normal rectum  . COLONOSCOPY N/A 04/30/2013   Dr. Fields: mild diverticulosis in descending and sigmoid colon, internal hemorrhoids.   . DILATION AND CURETTAGE OF UTERUS    . NM MYOCAR PERF WALL MOTION  10/2011   bruce myoview - breast attenuation noted in anterior region; EF 65%; no ischemia/infarct/scar; low risk  . TONSILLECTOMY  1980  . TUBAL LIGATION      Current Outpatient Prescriptions  Medication Sig Dispense Refill  . diazepam (VALIUM) 10 MG tablet Take 10 mg by mouth every 8 (eight) hours as needed (back pain).     . estradiol (ESTRACE) 0.5 MG tablet Take 0.5 mg by mouth daily.    . famotidine (PEPCID) 20 MG tablet One at bedtime 39 tablet 2  . loratadine (CLARITIN) 10 MG tablet Take 10 mg by mouth daily.    . pantoprazole (PROTONIX) 40 MG tablet Take 1 tablet (40 mg total) by mouth daily. Take 30-60 min before first meal of the day 30 tablet 2   No current facility-administered medications for   this visit.     Allergies as of 10/11/2016 - Review Complete 10/11/2016  Allergen Reaction Noted  . Codeine    . Fentanyl    . Morphine    . Nsaids  04/23/2013  . Penicillins    . Sulfamethoxazole-trimethoprim    . Influenza vaccines Rash 03/06/2014    Family History  Problem Relation Age of Onset  . Diabetes Mother   . Hypertension Mother   . Hyperlipidemia Mother   . Ulcerative colitis Father   . Breast cancer Maternal Grandmother   . Hypertension Maternal Grandmother   . Diabetes  Maternal Grandmother   . Stroke Maternal Grandmother   . Asthma Maternal Grandmother   . Stroke Maternal Grandfather   . Stroke Paternal Grandmother   . Diabetes Paternal Grandmother   . Heart disease Paternal Grandmother   . Heart disease Paternal Grandfather   . Hyperlipidemia Brother   . Hyperlipidemia Sister   . Hypertension Sister   . Diabetes Sister   . Down syndrome Child   . Heart disease Child   . Colon cancer Neg Hx     Social History   Social History  . Marital status: Married    Spouse name: Samuel  . Number of children: 1  . Years of education: 14   Occupational History  . Unemployed [employer]  .  Unemployed   Social History Main Topics  . Smoking status: Former Smoker    Packs/day: 0.50    Years: 40.00    Types: Cigarettes    Quit date: 12/24/2013  . Smokeless tobacco: Never Used     Comment: using vape as aid to sustain from smoking  . Alcohol use No  . Drug use: No  . Sexual activity: Not on file   Other Topics Concern  . Not on file   Social History Narrative   One son, Down syndrome,is right handed ,resides with son and husband    Review of Systems: Gen:see HPI  CV: Denies chest pain, heart palpitations, peripheral edema, syncope.  Resp: Denies shortness of breath at rest or with exertion. Denies wheezing or cough.  GI: see HPI  GU : Denies urinary burning, urinary frequency, urinary hesitancy MS: Denies joint pain, muscle weakness, cramps, or limitation of movement.  Derm: Denies rash, itching, dry skin Psych: Denies depression, anxiety, memory loss, and confusion Heme: Denies bruising, bleeding, and enlarged lymph nodes.  Physical Exam: BP 123/76   Pulse 96   Temp 98 F (36.7 C) (Oral)   Ht 5' 7" (1.702 m)   Wt 170 lb 12.8 oz (77.5 kg)   BMI 26.75 kg/m  General:   Alert and oriented. Pleasant and cooperative. Well-nourished and well-developed.  Head:  Normocephalic and atraumatic. Eyes:  Without icterus, sclera clear  and conjunctiva pink.  Ears:  Normal auditory acuity. Nose:  No deformity, discharge,  or lesions. Mouth:  No deformity or lesions, oral mucosa pink.  Lungs:  Clear to auscultation bilaterally. No wheezes, rales, or rhonchi. No distress.  Heart:  S1, S2 present without murmurs appreciated.  Abdomen:  +BS, soft, non-tender and non-distended. No HSM noted. No guarding or rebound. No masses appreciated.  Rectal:  Deferred  Msk:  Symmetrical without gross deformities. Normal posture. Extremities:  Without  edema. Neurologic:  Alert and  oriented x4;  grossly normal neurologically. Psych:  Alert and cooperative. Normal mood and affect.  Outside labs: Hgb 14.3, Hct 41.2. Tbili 0.4, Alk Phos 77, AST 27, ALT 32.  

## 2016-10-11 NOTE — Patient Instructions (Addendum)
I would like for you to trial Amitiza for constipation. We do not have the higher dosage of 24 mcg, so I am having you start on the 8 mcg dosing. Give this a try for about a week. Take it twice a day with food to avoid nausea. We should be getting additional samples soon, so we will let you know. If you need a higher dosage, we will try to provide those samples. Let me know which one works best for you, and I will send to the pharmacy.  We will see you in 6 weeks!

## 2016-10-12 NOTE — Progress Notes (Signed)
CC'ED TO PCP 

## 2016-10-21 ENCOUNTER — Telehealth: Payer: Self-pay | Admitting: Gastroenterology

## 2016-10-21 MED ORDER — LUBIPROSTONE 8 MCG PO CAPS: 8.0000 ug | ORAL_CAPSULE | Freq: Two times a day (BID) | ORAL | 3 refills | Status: DC

## 2016-10-21 NOTE — Telephone Encounter (Signed)
Amitiza 8 mcg sent to pharmacy.  

## 2016-10-21 NOTE — Telephone Encounter (Signed)
PATIENT CALLED AND STATED THAT THE [place] WORKED PERFECT AND WOULD [place] A PRESCRIPTION CALLED INTO [facility]

## 2016-10-21 NOTE — Addendum Note (Signed)
Addended by: Annitta Needs on: 10/21/2016 11:26 AM   Modules accepted: Orders

## 2016-10-25 ENCOUNTER — Telehealth: Payer: Self-pay | Admitting: Gastroenterology

## 2016-10-25 NOTE — Telephone Encounter (Signed)
Pt called to say that her insurance will not cover her amitiza. JL received a PA this morning on it. Pt is asking for samples or if she could get something else that isn't so expensive. Please advise.

## 2016-10-26 NOTE — Telephone Encounter (Signed)
I have samples of the Amitiza 8 mcg #12 at front desk for pick up to take bid with food. Pt is aware.  She is aware Almyra Free has done the PA.

## 2016-10-26 NOTE — Telephone Encounter (Signed)
PA has been completed and sent to the insurance company. I have not heard from them yet.

## 2016-10-27 NOTE — Telephone Encounter (Signed)
Received a fax from the pharmacy. They denied amitiza because the pt has to try and fail 2 formulary alternatives. Pt has tried and failed miralax but has not tried: linzess or lactulose.

## 2016-10-28 NOTE — Telephone Encounter (Signed)
Please give samples of Linzess 145 mcg.

## 2016-10-28 NOTE — Telephone Encounter (Signed)
Pt is aware I have Linzess 145 mcg samples #12 for her to take one daily 30 min before breakfast.  Samples at front for pick up.

## 2016-11-01 NOTE — Telephone Encounter (Signed)
Pt called and said she took ONE of the Linzess 145 mcg on Sat AM and she had watery diarrhea and is still having some results of very loose stool, said she will never take another one.   Please advise!

## 2016-11-02 NOTE — Telephone Encounter (Signed)
I am leaving #12 samples of the Amitiza 8 mcg at front for her til Almyra Free resubmits the PA.

## 2016-11-02 NOTE — Telephone Encounter (Signed)
Let's try to see if Amitiza 8 mcg can be resubmitted. She has failed all other alternatives. Intolerant to [place].

## 2016-11-02 NOTE — Telephone Encounter (Signed)
LMOM to call.

## 2016-11-02 NOTE — Telephone Encounter (Signed)
Pt left VM and I returned her call and left the full message for her to pick up samples of Amitiza and we will re submit the PA for it.  Call if questions.

## 2016-11-02 NOTE — Telephone Encounter (Signed)
Routing to Doris 

## 2016-11-08 NOTE — Telephone Encounter (Signed)
PA has been re-submitted to the insurance co.  Waiting for response.

## 2016-11-11 NOTE — Telephone Encounter (Signed)
Amitiza has been approved and I have faxed the information to the pharmacy.

## 2016-11-29 ENCOUNTER — Encounter: Payer: Self-pay | Admitting: Gastroenterology

## 2016-11-29 ENCOUNTER — Ambulatory Visit (INDEPENDENT_AMBULATORY_CARE_PROVIDER_SITE_OTHER): Payer: BLUE CROSS/BLUE SHIELD | Admitting: Gastroenterology

## 2016-11-29 VITALS — BP 131/78 | HR 101 | Temp 97.8°F | Ht 67.0 in | Wt 171.6 lb

## 2016-11-29 DIAGNOSIS — K588 Other irritable bowel syndrome: Secondary | ICD-10-CM | POA: Diagnosis not present

## 2016-11-29 DIAGNOSIS — K219 Gastro-esophageal reflux disease without esophagitis: Secondary | ICD-10-CM

## 2016-11-29 MED ORDER — PANTOPRAZOLE SODIUM 40 MG PO TBEC: 40.0000 mg | DELAYED_RELEASE_TABLET | Freq: Every day | ORAL | 3 refills | Status: DC

## 2016-11-29 NOTE — Progress Notes (Signed)
Referring Provider: Redmond School, MD Primary Care Physician:  Glo Herring., MD Primary GI: Dr. Oneida Alar   Chief Complaint  Patient presents with  . Abdominal Pain    HPI:   Natasha Wright is a 56 y.o. female presenting today in follow-up for constipation. Last colonoscopy in 2014 with diverticulosis and internal hemorrhoids. Last seen in Nov 2017 and started on Amitiza 8 mcg po BID. Low-volume hematochezia noted when last seen in the setting of straining. Felt to have pelvic floor dysfunction; patient also reports she was told she had a rectocele.   Now back to diarrhea. History of IBS, diarrhea-predominant historically. Alternates. Has been very stressed. Had some rectal bleeding with diarrhea, which has cleared up now. Diarrhea intermittently. Has dicyclomine at home, takes as needed. Woke up in middle of colonoscopy last time. Wants to have rectocele repaired. Unable to take a probiotic. Intolerant to [place]. Son was hospitalized for 17 days after cardiac ablation (Has down syndrome). Just celebrated Christmas last night with him. Wants to hold off on any testing until things settle down.   Symptomatic GERD, not on a PPI. Requesting Protonix. No dysphagia.   Past Medical History:  Diagnosis Date  . Anxiety   . B12 deficiency    h/o  . Family history of heart disease   . GERD (gastroesophageal reflux disease)   . Hypoglycemia   . IBS (irritable bowel syndrome)   . Lumbar disc disease   . OA (osteoarthritis)   . PONV (postoperative nausea and vomiting)    needs scop patch  . Rectocele   . Spinal stenosis in cervical region     Past Surgical History:  Procedure Laterality Date  . ABDOMINAL HYSTERECTOMY  1992  . ARTHRODESIS FOOT WITH WEIL OSTEOTOMY Left 05/09/2014   Procedure: LEFT FIRST TARSAL METATARSAL  ARTHRODESIS; LEFT SECOND WEIL OSTEOTOMY;  Surgeon: Wylene Simmer, MD;  Location: [facility];  Service: Orthopedics;  Laterality: Left;  .  BUNIONECTOMY WITH HAMMERTOE RECONSTRUCTION Left 05/09/2014   Procedure: LEFT MODIFIED MCBRIDE BUNIONECTOMY; LEFT SECOND HAMMER TOE CORRECTION;  Surgeon: Wylene Simmer, MD;  Location: [facility];  Service: Orthopedics;  Laterality: Left;  . CERVICAL SPINE SURGERY  2009  . CHOLECYSTECTOMY  2004   lap choli  . COLONOSCOPY  07/08/2003   RMR: Left-sided diverticula, remainder of colonic mucosa and terminal ileum appeared normal.. Minimally friable internal hemorrhoids, otherwise normal rectum  . COLONOSCOPY N/A 04/30/2013   Dr. Oneida Alar: mild diverticulosis in descending and sigmoid colon, internal hemorrhoids.   [name] DILATION AND CURETTAGE OF UTERUS    . NM MYOCAR PERF WALL MOTION  10/2011   bruce myoview - breast attenuation noted in anterior region; EF 65%; no ischemia/infarct/scar; low risk  . TONSILLECTOMY  1980  . TUBAL LIGATION      Current Outpatient Prescriptions  Medication Sig Dispense Refill  . diazepam (VALIUM) 10 MG tablet Take 10 mg by mouth every 8 (eight) hours as needed (back pain).     [name] estradiol (ESTRACE) 0.5 MG tablet Take 0.5 mg by mouth daily.    . famotidine (PEPCID) 20 MG tablet One at bedtime 39 tablet 2  . loratadine (CLARITIN) 10 MG tablet Take 10 mg by mouth daily.    [name] lubiprostone (AMITIZA) 8 MCG capsule Take 1 capsule (8 mcg total) by mouth 2 (two) times daily with a meal. (Patient not taking: Reported on 11/29/2016) 60 capsule 3  . pantoprazole (PROTONIX) 40 MG tablet Take 1 tablet (40 mg total)  by mouth daily. Take 30-60 min before first meal of the day (Patient not taking: Reported on 11/29/2016) 30 tablet 2   No current facility-administered medications for this visit.     Allergies as of 11/29/2016 - Review Complete 11/29/2016  Allergen Reaction Noted  . Codeine    . Fentanyl    . Morphine    . Nsaids  04/23/2013  . Penicillins    . Sulfamethoxazole-trimethoprim    . Influenza vaccines Rash 03/06/2014    Family History  Problem Relation Age of  Onset  . Diabetes Mother   . Hypertension Mother   . Hyperlipidemia Mother   . Ulcerative colitis Father   . Breast cancer Maternal Grandmother   . Hypertension Maternal Grandmother   . Diabetes Maternal Grandmother   . Stroke Maternal Grandmother   . Asthma Maternal Grandmother   . Stroke Maternal Grandfather   . Stroke Paternal Grandmother   . Diabetes Paternal Grandmother   . Heart disease Paternal Grandmother   . Heart disease Paternal Grandfather   . Hyperlipidemia Brother   . Hyperlipidemia Sister   . Hypertension Sister   . Diabetes Sister   . Down syndrome Child   . Heart disease Child   . Colon cancer Neg Hx     Social History   Social History  . Marital status: Married    Spouse name: Mikeal Hawthorne  . Number of children: 1  . Years of education: 65   Occupational History  . Unemployed Printmaker  .  Unemployed   Social History Main Topics  . Smoking status: Former Smoker    Packs/day: 0.50    Years: 40.00    Types: Cigarettes    Quit date: 12/24/2013  . Smokeless tobacco: Never Used     Comment: using vape as aid to sustain from smoking  . Alcohol use No  . Drug use: No  . Sexual activity: Not Asked   Other Topics Concern  . None   Social History Narrative   One son, Down syndrome,is right handed ,resides with son and husband    Review of Systems: As mentioned in HPI   Physical Exam: BP 131/78   Pulse (!) 101   Temp 97.8 F (36.6 C) (Oral)   Ht 5\' 7"  (1.702 m)   Wt 171 lb 9.6 oz (77.8 kg)   BMI 26.88 kg/m  General:   Alert and oriented. No distress noted. Pleasant and cooperative.  Head:  Normocephalic and atraumatic. Eyes:  Conjuctiva clear without scleral icterus. Abdomen:  +BS, soft, non-tender and non-distended. No rebound or guarding. No HSM or masses noted. Msk:  Symmetrical without gross deformities. Normal posture. Extremities:  Without edema. Neurologic:  Alert and  oriented x4;  grossly normal neurologically. Psych:   Alert and cooperative. Normal mood and affect.

## 2016-11-29 NOTE — Patient Instructions (Signed)
I have sent in Protonix for you to take 30 minutes before breakfast each day.   Please call me when things settle down. I think a hydrogen breath test would be good in the future; we also need to consider a colonoscopy if any further rectal bleeding.

## 2016-11-30 ENCOUNTER — Telehealth: Payer: Self-pay | Admitting: Gastroenterology

## 2016-11-30 NOTE — Assessment & Plan Note (Signed)
Restart Protonix, as this has worked well in the past. No alarm symptoms.

## 2016-11-30 NOTE — Progress Notes (Signed)
CC'D TO PCP °

## 2016-11-30 NOTE — Assessment & Plan Note (Signed)
Historically diarrhea-predominant, dealing with constipation at last visit, now back to diarrhea. Does well with Bentyl. Stress exacerbating presentation. Low-volume hematochezia noted in setting of diarrhea that has now resolved. Last colonoscopy in 2014. Significant bloating, gas. Discussed proceeding with colonoscopy in near future due to persistent low-volume hematochezia. Consider HBT. Patient would like to wait on this due to multiple health issues with her son. She will call when she is ready to proceed. Continue Bentyl for now, which works well for her.

## 2016-11-30 NOTE — Telephone Encounter (Signed)
Patient will be calling w

## 2016-12-07 NOTE — Telephone Encounter (Signed)
Opened in error

## 2016-12-22 ENCOUNTER — Other Ambulatory Visit: Payer: Self-pay | Admitting: Internal Medicine

## 2016-12-22 DIAGNOSIS — Z1231 Encounter for screening mammogram for malignant neoplasm of breast: Secondary | ICD-10-CM

## 2017-01-05 ENCOUNTER — Ambulatory Visit
Admission: RE | Admit: 2017-01-05 | Discharge: 2017-01-05 | Disposition: A | Discharge status: Home / Self Care | Payer: BLUE CROSS/BLUE SHIELD | Source: Ambulatory Visit | Attending: Internal Medicine | Admitting: Internal Medicine

## 2017-01-05 DIAGNOSIS — Z1231 Encounter for screening mammogram for malignant neoplasm of breast: Secondary | ICD-10-CM

## 2017-02-09 ENCOUNTER — Encounter (INDEPENDENT_AMBULATORY_CARE_PROVIDER_SITE_OTHER): Payer: Self-pay | Admitting: Orthopaedic Surgery

## 2017-02-09 ENCOUNTER — Ambulatory Visit (INDEPENDENT_AMBULATORY_CARE_PROVIDER_SITE_OTHER): Payer: BLUE CROSS/BLUE SHIELD

## 2017-02-09 ENCOUNTER — Ambulatory Visit (INDEPENDENT_AMBULATORY_CARE_PROVIDER_SITE_OTHER): Payer: BLUE CROSS/BLUE SHIELD | Admitting: Orthopaedic Surgery

## 2017-02-09 VITALS — BP 140/81 | HR 88 | Resp 14 | Ht 67.0 in | Wt 170.0 lb

## 2017-02-09 DIAGNOSIS — M25552 Pain in left hip: Secondary | ICD-10-CM | POA: Diagnosis not present

## 2017-02-09 MED ORDER — METHYLPREDNISOLONE ACETATE 40 MG/ML IJ SUSP
80.0000 mg | INTRAMUSCULAR | Status: AC | PRN
  Administered 2017-02-09: 80 mg

## 2017-02-09 MED ORDER — LIDOCAINE HCL 1 % IJ SOLN
5.0000 mL | INTRAMUSCULAR | Status: AC | PRN
  Administered 2017-02-09: 5 mL

## 2017-02-09 NOTE — Progress Notes (Signed)
Office Visit Note   Patient: Natasha Wright           Date of Birth: 06-08-1961           MRN: [id number] Visit Date: 02/09/2017              Requested by: Redmond School, MD [address] [place], [place] [address] PCP: Glo Herring., MD   Assessment & Plan: Visit Diagnoses: Greater trochanteric bursitis left hip with mild osteoarthritis both hips. Office visit over 30 minutes discussing her diagnosis and treatment options. We'll try a local cortisone injection and monitor her response over the next 3-4 weeks   Orders:  No orders of the defined types were placed in this encounter.  No orders of the defined types were placed in this encounter.     Procedures: Large Joint Inj Date/Time: 02/09/2017 10:02 AM Performed by: Garald Balding Authorized by: Garald Balding   Consent Given by:  Patient Timeout: prior to procedure the correct patient, procedure, and site was verified   Indications:  Pain Location:  Hip Site:  L greater trochanter Prep: patient was prepped and draped in usual sterile fashion   Needle Size:  22 G Needle Length:  3.5 inches Approach:  Lateral Ultrasound Guidance: No   Fluoroscopic Guidance: No   Arthrogram: No   Medications:  5 mL lidocaine 1 %; 80 mg methylPREDNISolone acetate 40 MG/ML Aspiration Attempted: No   Patient tolerance:  Patient tolerated the procedure well with no immediate complications     Clinical Data: No additional findings.   Subjective: No chief complaint on file.   Natasha Wright is a 56 year old female presents with left buttock pain that radiates down to her knee. She has a hx of slight scoliosis, multi level facet degeneration. 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 She had a MRI at [facility] ordered by her PCP Dr. Carloyn Manner.  Natasha Wright relates a long history of problems with her cervical spine and lumbar spine. She's had 2 prior cervical spine surgeries and still  having "some trouble. She's had an MRI scan of her lumbar spine last year and has been followed by Dr. Glenna Fellows. "Surgery has been discussed. Recently she's complaining of pain in the area of her left hip and specifically laterally. She denies any history of injury or trauma. She thinks this is separate from her back pain. Seemed to start somewhere along the lateral aspect of her hip radiates along the lateral thigh is far distally as her knee and occasionally associated with some "tingling. She has not specifically had groin pain. She has been seen by Dr. Ace Gins multiple occasions over the past for sore years for injections in the area of her right hip and back and she has multiple allergies to medicines as outlined.  Review of Systems   Objective: Vital Signs: There were no vitals taken for this visit.  Physical Exam  Ortho Exam straight leg raise negative bilaterally. Has pain over the greater trochanteric area of her left hip and even along the lateral thigh. No pain with range of motion of either hip with internal or external rotation. Limited range of motion of cervical spine with some discomfort. Good grip and release.  :    Imaging: No results found.   PMFS History: Patient Active Problem List   Diagnosis Date Noted  . GERD (gastroesophageal reflux disease) 11/29/2016  . Nocturnal hypoxemia 03/01/2014  . Chest pain 03/01/2014  .  DOE (dyspnea on exertion) 03/01/2014  . Abdominal bloating 04/18/2013  . UNSPECIFIED PERIPHERAL VASCULAR DISEASE 06/11/2009  . CARPAL TUNNEL SYNDROME, BILATERAL 12/20/2008  . NECK PAIN, CHRONIC 12/06/2008  . FATIGUE 12/06/2008  . VIRAL URI 08/09/2008  . HYPOGLYCEMIA 07/15/2008  . MACULAR DEGENERATION 07/15/2008  . VARICOSE VEINS, LOWER EXTREMITIES, WITH INFLAMMATION 07/15/2008  . ALLERGIC RHINITIS 07/15/2008  . Constipation 07/15/2008  . IBS 07/15/2008  . OVERACTIVE BLADDER 07/15/2008  . ARTHRITIS 07/15/2008  . LOW BACK PAIN 07/15/2008  .  FIBROMYALGIA 07/15/2008  . INSOMNIA 07/15/2008  . MIGRAINES, HX OF 07/15/2008   Past Medical History:  Diagnosis Date  . Anxiety   . B12 deficiency    h/o  . Family history of heart disease   . GERD (gastroesophageal reflux disease)   . Hypoglycemia   . IBS (irritable bowel syndrome)   . Lumbar disc disease   . OA (osteoarthritis)   . PONV (postoperative nausea and vomiting)    needs scop patch  . Rectocele   . Spinal stenosis in cervical region     Family History  Problem Relation Age of Onset  . Diabetes Mother   . Hypertension Mother   . Hyperlipidemia Mother   . Ulcerative colitis Father   . Breast cancer Maternal Grandmother   . Hypertension Maternal Grandmother   . Diabetes Maternal Grandmother   . Stroke Maternal Grandmother   . Asthma Maternal Grandmother   . Stroke Maternal Grandfather   . Stroke Paternal Grandmother   . Diabetes Paternal Grandmother   . Heart disease Paternal Grandmother   . Heart disease Paternal Grandfather   . Hyperlipidemia Brother   . Hyperlipidemia Sister   . Hypertension Sister   . Diabetes Sister   . Down syndrome Child   . Heart disease Child   . Colon cancer Neg Hx     Past Surgical History:  Procedure Laterality Date  . ABDOMINAL HYSTERECTOMY  1992  . ARTHRODESIS FOOT WITH WEIL OSTEOTOMY Left 05/09/2014   Procedure: LEFT FIRST TARSAL METATARSAL  ARTHRODESIS; LEFT SECOND WEIL OSTEOTOMY;  Surgeon: Wylene Simmer, MD;  Location: [facility];  Service: Orthopedics;  Laterality: Left;  . BUNIONECTOMY WITH HAMMERTOE RECONSTRUCTION Left 05/09/2014   Procedure: LEFT MODIFIED MCBRIDE BUNIONECTOMY; LEFT SECOND HAMMER TOE CORRECTION;  Surgeon: Wylene Simmer, MD;  Location: [facility];  Service: Orthopedics;  Laterality: Left;  . CERVICAL SPINE SURGERY  2009  . CHOLECYSTECTOMY  2004   lap choli  . COLONOSCOPY  07/08/2003   RMR: Left-sided diverticula, remainder of colonic mucosa and terminal ileum appeared normal..  Minimally friable internal hemorrhoids, otherwise normal rectum  . COLONOSCOPY N/A 04/30/2013   Dr. Oneida Alar: mild diverticulosis in descending and sigmoid colon, internal hemorrhoids.   [name] DILATION AND CURETTAGE OF UTERUS    . NM MYOCAR PERF WALL MOTION  10/2011   bruce myoview - breast attenuation noted in anterior region; EF 65%; no ischemia/infarct/scar; low risk  . TONSILLECTOMY  1980  . TUBAL LIGATION     Social History   Occupational History  . Unemployed Printmaker  .  Unemployed   Social History Main Topics  . Smoking status: Former Smoker    Packs/day: 0.50    Years: 40.00    Types: Cigarettes    Quit date: 12/24/2013  . Smokeless tobacco: Never Used     Comment: using vape as aid to sustain from smoking  . Alcohol use No  . Drug use: No  . Sexual activity:  Not on file

## 2017-03-09 ENCOUNTER — Encounter (INDEPENDENT_AMBULATORY_CARE_PROVIDER_SITE_OTHER): Payer: Self-pay | Admitting: Orthopaedic Surgery

## 2017-03-09 ENCOUNTER — Ambulatory Visit (INDEPENDENT_AMBULATORY_CARE_PROVIDER_SITE_OTHER): Payer: BLUE CROSS/BLUE SHIELD | Admitting: Orthopaedic Surgery

## 2017-03-09 ENCOUNTER — Other Ambulatory Visit (INDEPENDENT_AMBULATORY_CARE_PROVIDER_SITE_OTHER): Payer: Self-pay | Admitting: Orthopaedic Surgery

## 2017-03-09 VITALS — BP 141/85 | HR 89 | Ht 67.0 in | Wt 170.0 lb

## 2017-03-09 DIAGNOSIS — M25552 Pain in left hip: Secondary | ICD-10-CM | POA: Diagnosis not present

## 2017-03-09 NOTE — Progress Notes (Signed)
Office Visit Note   Patient: Natasha Wright           Date of Birth: 02-27-1961           MRN: [id number] Visit Date: 03/09/2017              Requested by: Natasha Wright [address] [place], [place] [address] PCP: Natasha Wright   Assessment & Plan: Visit Diagnoses:  1. Pain of left hip joint   Minimal response to greater trochanteric bursa injection several weeks ago. I believe some of her pain is related to the osteoarthritis in the left hip. Certainly there is a chronic issue with her lumbar spine and she will be following up with Natasha Wright. I I believe week we distally to rule out all the possibilities or one could definitively say its related just to her back or just to her hip. Her insurance changes May 1 so we will set up an intra-articular cortisone injection of her left hip at [facility] after May 1 and plan to see her back in approximately a month    Follow-Up Instructions: Return in about 1 month (around 04/08/2017).   Orders:  No orders of the defined types were placed in this encounter.  No orders of the defined types were placed in this encounter.     Procedures: No procedures performed   Clinical Data: No additional findings.   Subjective: Chief Complaint  Patient presents with  . Left Hip - Pain, Follow-up    Natasha Wright is a 56 y o female that presents with bilateral hip pain. She cannot take any meds due to vomiting.  . Right Hip - Pain  Natasha Wright relates that she had some relief in the area of her left hip after the greater trochanteric bursa injection  forseveral weeks only to have the same pain return area she does have a chronic problem with the back pain and buttock discomfort as previously identified. Her insurance changes of first of May down that she has been granted disability and wants to wait until then before she considers any further diagnostic testing. She is experiencing pain more in the left than the right  groin and, certainly, a good portion of her pain may be related to the mild osteoarthritis in her hips demonstrated per MRI scan. He also has an issue with arthritis at the base of both thumbs and has seen Natasha Wright in the past. He had suggested surgery but she notes that there are a lot of things going on in her life that prevents her from having any interventional procedures done at this point==   HPI  Review of Systems   Objective: Vital Signs: BP (!) 141/85   Pulse 89   Ht 5\' 7"  (1.702 m)   Wt 170 lb (77.1 kg)   BMI 26.63 kg/m   Physical Exam  Ortho Exam mild pain in both hips i.e. groin region with internal/external rotation. No specific pain over either greater trochanter. Straight leg raise is negative. Some percussible tenderness of the lumbar spine. No swelling distally neurovascular exam intact. As of grind test and degenerative changes at the base of both thumbs at the carpal metacarpal joints. Skin intact neurovascular exam intact. Some evidence of clinical subluxation.  Specialty Comments:  No specialty comments available.  Imaging: No results found.   PMFS History: Patient Active Problem List   Diagnosis Date Noted  . GERD (gastroesophageal reflux disease) 11/29/2016  . Nocturnal hypoxemia 03/01/2014  .  Chest pain 03/01/2014  . DOE (dyspnea on exertion) 03/01/2014  . Abdominal bloating 04/18/2013  . UNSPECIFIED PERIPHERAL VASCULAR DISEASE 06/11/2009  . CARPAL TUNNEL SYNDROME, BILATERAL 12/20/2008  . NECK PAIN, CHRONIC 12/06/2008  . FATIGUE 12/06/2008  . VIRAL URI 08/09/2008  . HYPOGLYCEMIA 07/15/2008  . MACULAR DEGENERATION 07/15/2008  . VARICOSE VEINS, LOWER EXTREMITIES, WITH INFLAMMATION 07/15/2008  . ALLERGIC RHINITIS 07/15/2008  . Constipation 07/15/2008  . IBS 07/15/2008  . OVERACTIVE BLADDER 07/15/2008  . ARTHRITIS 07/15/2008  . LOW BACK PAIN 07/15/2008  . FIBROMYALGIA 07/15/2008  . INSOMNIA 07/15/2008  . MIGRAINES, HX OF 07/15/2008   Past  Medical History:  Diagnosis Date  . Anxiety   . B12 deficiency    h/o  . Family history of heart disease   . GERD (gastroesophageal reflux disease)   . Hypoglycemia   . IBS (irritable bowel syndrome)   . Lumbar disc disease   . OA (osteoarthritis)   . PONV (postoperative nausea and vomiting)    needs scop patch  . Rectocele   . Spinal stenosis in cervical region     Family History  Problem Relation Age of Onset  . Diabetes Mother   . Hypertension Mother   . Hyperlipidemia Mother   . Ulcerative colitis Father   . Breast cancer Maternal Grandmother   . Hypertension Maternal Grandmother   . Diabetes Maternal Grandmother   . Stroke Maternal Grandmother   . Asthma Maternal Grandmother   . Stroke Maternal Grandfather   . Stroke Paternal Grandmother   . Diabetes Paternal Grandmother   . Heart disease Paternal Grandmother   . Heart disease Paternal Grandfather   . Hyperlipidemia Brother   . Hyperlipidemia Sister   . Hypertension Sister   . Diabetes Sister   . Down syndrome Child   . Heart disease Child   . Colon cancer Neg Hx     Past Surgical History:  Procedure Laterality Date  . ABDOMINAL HYSTERECTOMY  1992  . ARTHRODESIS FOOT WITH WEIL OSTEOTOMY Left 05/09/2014   Procedure: LEFT FIRST TARSAL METATARSAL  ARTHRODESIS; LEFT SECOND WEIL OSTEOTOMY;  Surgeon: Natasha Wright;  Location: [facility];  Service: Orthopedics;  Laterality: Left;  . BUNIONECTOMY WITH HAMMERTOE RECONSTRUCTION Left 05/09/2014   Procedure: LEFT MODIFIED MCBRIDE BUNIONECTOMY; LEFT SECOND HAMMER TOE CORRECTION;  Surgeon: Natasha Wright;  Location: [facility];  Service: Orthopedics;  Laterality: Left;  . CERVICAL SPINE SURGERY  2009  . CHOLECYSTECTOMY  2004   lap choli  . COLONOSCOPY  07/08/2003   RMR: Left-sided diverticula, remainder of colonic mucosa and terminal ileum appeared normal.. Minimally friable internal hemorrhoids, otherwise normal rectum  . COLONOSCOPY N/A  04/30/2013   Natasha Wright: mild diverticulosis in descending and sigmoid colon, internal hemorrhoids.   [name] DILATION AND CURETTAGE OF UTERUS    . NM MYOCAR PERF WALL MOTION  10/2011   bruce myoview - breast attenuation noted in anterior region; EF 65%; no ischemia/infarct/scar; low risk  . TONSILLECTOMY  1980  . TUBAL LIGATION     Social History   Occupational History  . Unemployed Printmaker  .  Unemployed   Social History Main Topics  . Smoking status: Former Smoker    Packs/day: 0.50    Years: 40.00    Types: Cigarettes    Quit date: 12/24/2013  . Smokeless tobacco: Never Used     Comment: using vape as aid to sustain from smoking  . Alcohol use No  . Drug use:  No  . Sexual activity: Not on file

## 2017-03-28 ENCOUNTER — Encounter (HOSPITAL_COMMUNITY): Payer: Self-pay

## 2017-03-28 ENCOUNTER — Ambulatory Visit (HOSPITAL_COMMUNITY)
Admission: RE | Admit: 2017-03-28 | Discharge: 2017-03-28 | Disposition: A | Discharge status: Home / Self Care | Payer: Medicare Other | Source: Ambulatory Visit | Attending: Orthopaedic Surgery | Admitting: Orthopaedic Surgery

## 2017-03-28 ENCOUNTER — Other Ambulatory Visit (INDEPENDENT_AMBULATORY_CARE_PROVIDER_SITE_OTHER): Payer: Self-pay | Admitting: Orthopaedic Surgery

## 2017-03-28 DIAGNOSIS — M25552 Pain in left hip: Secondary | ICD-10-CM | POA: Insufficient documentation

## 2017-03-28 MED ORDER — SODIUM CHLORIDE 0.9 % IJ SOLN
INTRAMUSCULAR | Status: AC
  Filled 2017-03-28: qty 20

## 2017-03-28 MED ORDER — METHYLPREDNISOLONE ACETATE 40 MG/ML IJ SUSP
INTRAMUSCULAR | Status: AC
  Administered 2017-03-28: 80 mg
  Filled 2017-03-28: qty 2

## 2017-03-28 MED ORDER — LIDOCAINE HCL (PF) 2 % IJ SOLN
INTRAMUSCULAR | Status: AC
  Administered 2017-03-28: 4 mL
  Filled 2017-03-28: qty 20

## 2017-03-28 MED ORDER — IOPAMIDOL (ISOVUE-300) INJECTION 61%
INTRAVENOUS | Status: AC
  Administered 2017-03-28: 1 mL
  Filled 2017-03-28: qty 30

## 2017-03-28 MED ORDER — POVIDONE-IODINE 10 % EX SOLN
CUTANEOUS | Status: AC
  Filled 2017-03-28: qty 15

## 2017-03-28 NOTE — Procedures (Signed)
Preprocedure Dx: LT hip pain Postprocedure Dx: LT hip pain Procedure  Fluoroscopically guided therapeutic LT joint injection  Radiologist:  Thornton Papas Anesthesia:  4 ml of 2% lidocaine Injectate:  5 ml of 80 mg Depo-Medrol and 2% lidocaine Fluoro time:  0 minutes 6 seconds EBL:   None Complications: None

## 2017-03-30 ENCOUNTER — Emergency Department (HOSPITAL_COMMUNITY)
Admission: EM | Admit: 2017-03-30 | Discharge: 2017-03-30 | Disposition: A | Discharge status: Home / Self Care | Payer: Medicare Other | Attending: Emergency Medicine | Admitting: Emergency Medicine

## 2017-03-30 ENCOUNTER — Encounter (HOSPITAL_COMMUNITY): Payer: Self-pay | Admitting: *Deleted

## 2017-03-30 ENCOUNTER — Emergency Department (HOSPITAL_COMMUNITY): Payer: Medicare Other

## 2017-03-30 DIAGNOSIS — R002 Palpitations: Secondary | ICD-10-CM | POA: Diagnosis not present

## 2017-03-30 DIAGNOSIS — R0602 Shortness of breath: Secondary | ICD-10-CM | POA: Insufficient documentation

## 2017-03-30 DIAGNOSIS — R072 Precordial pain: Secondary | ICD-10-CM

## 2017-03-30 DIAGNOSIS — Z79899 Other long term (current) drug therapy: Secondary | ICD-10-CM | POA: Insufficient documentation

## 2017-03-30 DIAGNOSIS — Z87891 Personal history of nicotine dependence: Secondary | ICD-10-CM | POA: Insufficient documentation

## 2017-03-30 LAB — CBC
HEMATOCRIT: 40.6 % (ref 36.0–46.0)
Hemoglobin: 14 g/dL (ref 12.0–15.0)
MCH: 32.4 pg (ref 26.0–34.0)
MCHC: 34.5 g/dL (ref 30.0–36.0)
MCV: 94 fL (ref 78.0–100.0)
PLATELETS: 264 10*3/uL (ref 150–400)
RBC: 4.32 MIL/uL (ref 3.87–5.11)
RDW: 13.3 % (ref 11.5–15.5)
WBC: 7.5 10*3/uL (ref 4.0–10.5)

## 2017-03-30 LAB — BASIC METABOLIC PANEL
Anion gap: 7 (ref 5–15)
BUN: 13 mg/dL (ref 6–20)
CO2: 25 mmol/L (ref 22–32)
CREATININE: 0.78 mg/dL (ref 0.44–1.00)
Calcium: 9.2 mg/dL (ref 8.9–10.3)
Chloride: 109 mmol/L (ref 101–111)
GFR calc Af Amer: >60 mL/min (ref >60)
Glucose, Bld: 99 mg/dL (ref 65–99)
POTASSIUM: 3.6 mmol/L (ref 3.5–5.1)
SODIUM: 141 mmol/L (ref 135–145)

## 2017-03-30 LAB — TROPONIN I: Troponin I: <0.03 ng/mL (ref <0.03)

## 2017-03-30 LAB — D-DIMER, QUANTITATIVE (NOT AT ARMC)

## 2017-03-30 LAB — TSH: TSH: 1.337 u[IU]/mL (ref 0.350–4.500)

## 2017-03-30 NOTE — ED Provider Notes (Signed)
[facility] DEPT Provider Note   CSN: [id number] Arrival date & time: 03/30/17  1525     History   Chief Complaint Chief Complaint  Patient presents with  . Chest Pain    HPI Natasha Wright is a 56 y.o. female.  Patient with a complaint of shortness of breath chest discomfort on the light side removing to the left arm starting at 12 noon in lasting until 2:30 PM has resolved. Room air sats are currently 99%. Patient has had a history of some thyroid problems in the past. Patient without any known cardiac problems. Patient is a former smoker. Quit in February 2015. Does have a family history of heart disease but patient has no personal risk factors.       Past Medical History:  Diagnosis Date  . Anxiety   . B12 deficiency    h/o  . Family history of heart disease   . GERD (gastroesophageal reflux disease)   . Hypoglycemia   . IBS (irritable bowel syndrome)   . Lumbar disc disease   . OA (osteoarthritis)   . PONV (postoperative nausea and vomiting)    needs scop patch  . Rectocele   . Spinal stenosis in cervical region     Patient Active Problem List   Diagnosis Date Noted  . GERD (gastroesophageal reflux disease) 11/29/2016  . Nocturnal hypoxemia 03/01/2014  . Chest pain 03/01/2014  . DOE (dyspnea on exertion) 03/01/2014  . Abdominal bloating 04/18/2013  . UNSPECIFIED PERIPHERAL VASCULAR DISEASE 06/11/2009  . CARPAL TUNNEL SYNDROME, BILATERAL 12/20/2008  . NECK PAIN, CHRONIC 12/06/2008  . FATIGUE 12/06/2008  . VIRAL URI 08/09/2008  . HYPOGLYCEMIA 07/15/2008  . MACULAR DEGENERATION 07/15/2008  . VARICOSE VEINS, LOWER EXTREMITIES, WITH INFLAMMATION 07/15/2008  . ALLERGIC RHINITIS 07/15/2008  . Constipation 07/15/2008  . IBS 07/15/2008  . OVERACTIVE BLADDER 07/15/2008  . ARTHRITIS 07/15/2008  . LOW BACK PAIN 07/15/2008  . FIBROMYALGIA 07/15/2008  . INSOMNIA 07/15/2008  . MIGRAINES, HX OF 07/15/2008    Past Surgical History:  Procedure Laterality  Date  . ABDOMINAL HYSTERECTOMY  1992  . ARTHRODESIS FOOT WITH WEIL OSTEOTOMY Left 05/09/2014   Procedure: LEFT FIRST TARSAL METATARSAL  ARTHRODESIS; LEFT SECOND WEIL OSTEOTOMY;  Surgeon: Wylene Simmer, MD;  Location: [place];  Service: Orthopedics;  Laterality: Left;  . BUNIONECTOMY WITH HAMMERTOE RECONSTRUCTION Left 05/09/2014   Procedure: LEFT MODIFIED MCBRIDE BUNIONECTOMY; LEFT SECOND HAMMER TOE CORRECTION;  Surgeon: Wylene Simmer, MD;  Location: [place];  Service: Orthopedics;  Laterality: Left;  . CERVICAL SPINE SURGERY  2009  . CHOLECYSTECTOMY  2004   lap choli  . COLONOSCOPY  07/08/2003   RMR: Left-sided diverticula, remainder of colonic mucosa and terminal ileum appeared normal.. Minimally friable internal hemorrhoids, otherwise normal rectum  . COLONOSCOPY N/A 04/30/2013   Dr. Oneida Alar: mild diverticulosis in descending and sigmoid colon, internal hemorrhoids.   [name] DILATION AND CURETTAGE OF UTERUS    . NM MYOCAR PERF WALL MOTION  10/2011   bruce myoview - breast attenuation noted in anterior region; EF 65%; no ischemia/infarct/scar; low risk  . TONSILLECTOMY  1980  . TUBAL LIGATION      OB History    No data available       Home Medications    Prior to Admission medications   Medication Sig Start Date End Date Taking? Authorizing Provider  diazepam (VALIUM) 10 MG tablet Take 10 mg by mouth every 8 (eight) hours as needed (back pain).    Yes  [provider]  estradiol (ESTRACE) 0.5 MG tablet Take 0.5 mg by mouth daily.   Yes [provider]  famotidine (PEPCID) 20 MG tablet One at bedtime Patient taking differently: Take 20 mg by mouth at bedtime. One at bedtime 03/06/14  Yes Tanda Rockers, MD  Multiple Vitamin (MULTIVITAMIN WITH MINERALS) TABS tablet Take 1 tablet by mouth daily.   Yes [provider]  pantoprazole (PROTONIX) 40 MG tablet Take 1 tablet (40 mg total) by mouth daily. 30 minutes before breakfast 11/29/16  Yes  Annitta Needs, NP    Family History Family History  Problem Relation Age of Onset  . Diabetes Mother   . Hypertension Mother   . Hyperlipidemia Mother   . Ulcerative colitis Father   . Breast cancer Maternal Grandmother   . Hypertension Maternal Grandmother   . Diabetes Maternal Grandmother   . Stroke Maternal Grandmother   . Asthma Maternal Grandmother   . Stroke Maternal Grandfather   . Stroke Paternal Grandmother   . Diabetes Paternal Grandmother   . Heart disease Paternal Grandmother   . Heart disease Paternal Grandfather   . Hyperlipidemia Brother   . Hyperlipidemia Sister   . Hypertension Sister   . Diabetes Sister   . Down syndrome Child   . Heart disease Child   . Colon cancer Neg Hx     Social History Social History  Substance Use Topics  . Smoking status: Former Smoker    Packs/day: 0.50    Years: 40.00    Types: Cigarettes    Quit date: 12/24/2013  . Smokeless tobacco: Never Used     Comment: using vape as aid to sustain from smoking  . Alcohol use No     Allergies   Penicillins; Codeine; Fentanyl; Morphine; Nsaids; Sulfamethoxazole-trimethoprim; and Influenza vaccines   Review of Systems Review of Systems  Constitutional: Negative for fever.  HENT: Negative for congestion.   Eyes: Negative for visual disturbance.  Respiratory: Positive for shortness of breath.   Cardiovascular: Positive for chest pain and palpitations. Negative for leg swelling.  Gastrointestinal: Negative for abdominal pain, nausea and vomiting.  Genitourinary: Negative for dysuria.  Musculoskeletal: Negative for back pain.  Skin: Negative for rash.  Neurological: Negative for headaches.  Hematological: Does not bruise/bleed easily.  Psychiatric/Behavioral: Negative for confusion.     Physical Exam Updated Vital Signs BP 126/76   Pulse 70   Temp 98.7 F (37.1 C) (Oral)   Resp 17   Ht 5\' 7"  (1.702 m)   Wt 77.1 kg   SpO2 100%   BMI 26.63 kg/m   Physical Exam    Constitutional: She is oriented to person, place, and time. She appears well-nourished. No distress.  HENT:  Head: Normocephalic and atraumatic.  Mouth/Throat: Oropharynx is clear and moist.  Eyes: Conjunctivae and EOM are normal. Pupils are equal, round, and reactive to light.  Neck: Normal range of motion. Neck supple.  Cardiovascular: Normal rate, regular rhythm and normal heart sounds.   Pulmonary/Chest: Effort normal and breath sounds normal. She exhibits no tenderness.  Abdominal: Soft. Bowel sounds are normal.  Musculoskeletal: Normal range of motion. She exhibits no edema.  Neurological: She is alert and oriented to person, place, and time. No cranial nerve deficit or sensory deficit. She exhibits normal muscle tone. Coordination normal.  Skin: Skin is warm. No rash noted.  Nursing note and vitals reviewed.    ED Treatments / Results  Labs (all labs ordered are listed, but only abnormal  results are displayed) Labs Reviewed  BASIC METABOLIC PANEL  CBC  TROPONIN I  TSH  D-DIMER, QUANTITATIVE (NOT AT [facility])  TROPONIN I    EKG  EKG Interpretation  Date/Time:  Wednesday Mar 30 2017 15:30:28 EDT Ventricular Rate:  108 PR Interval:  142 QRS Duration: 86 QT Interval:  342 QTC Calculation: 458 R Axis:   67 Text Interpretation:  Sinus tachycardia Otherwise normal ECG No previous ECGs available Confirmed by Trek Kimball  MD, Marcelina Mclaurin [phone]) on 03/30/2017 4:56:17 PM       Radiology Dg Chest 2 View  Result Date: 03/30/2017 CLINICAL DATA:  Shortness of breath, heart racing, LEFT side chest and neck pain beginning today, history GERD, irritable bowel syndrome EXAM: CHEST  2 VIEW COMPARISON:  03/06/2014 FINDINGS: Normal heart size, mediastinal contours, and pulmonary vascularity. Minimal RIGHT basilar atelectasis. Lungs otherwise clear. No pleural effusion or pneumothorax. Prior cervical spine fusion. IMPRESSION: Minimal RIGHT basilar atelectasis. Electronically Signed   By: Lavonia [place] M.D.   On: 03/30/2017 16:37    Procedures Procedures (including critical care time)  Medications Ordered in ED Medications - No data to display   Initial Impression / Assessment and Plan / ED Course  I have reviewed the triage vital signs and the nursing notes.  Pertinent labs & imaging results that were available during my care of the patient were reviewed by me and considered in my medical decision making (see chart for details).     The patient came in with a complaint of heart palpitations currently not present and also with some chest type complaint. And shortness of breath. Felt like heart was racing. Patient states that she get a reading at home of 147. And having some left-sided chest pain. Symptoms started around noon and went until 2:30 this afternoon. As stated sure it was shortness of breath.  Workup including troponins 2 last one done at 7 PM were negative d-dimer negative patient's had thyroid problems in the past. TSH was normal. Chest x-ray negative for any acute findings.  No arrhythmias on monitor here.  We'll refer patient to cardiology for follow-up follow-up will have her start a baby aspirin area and she'll return for any new or worse symptoms or any rapid heart rate or palpitations at last 40 minutes or longer. Patient stable for discharge home.     Final Clinical Impressions(s) / ED Diagnoses   Final diagnoses:  Precordial pain  Heart palpitations    New Prescriptions New Prescriptions   No medications on file     Fredia Sorrow, MD 03/30/17 2039

## 2017-03-30 NOTE — ED Notes (Signed)
Pt ambulatory to waiting room. Pt verbalized understanding of discharge instructions.   

## 2017-03-30 NOTE — Discharge Instructions (Signed)
At today's workup without any acute findings. Heart marker 2 was normal. Return for any new or worse chest pain. Return for any rapid heart palpitations lasting 40 minutes or longer. Make appointment to follow-up with cardiology. Information provided. Start taking a baby aspirin a day.

## 2017-03-30 NOTE — ED Triage Notes (Addendum)
Pt states she became short of breath today and feels like her heart is racing. Her at home reading was 147. Pt is having left chest pain.  Pt got steroid injection in left hip on Monday.

## 2017-04-04 ENCOUNTER — Ambulatory Visit: Payer: Medicare Other | Admitting: Adult Health

## 2017-04-04 NOTE — Progress Notes (Deleted)
Cardiology Office Note   Date:  04/04/2017   ID:  Ilyse Tremain, DOB 1961/11/10, MRN [id number]  PCP:  Redmond School, MD  Cardiologist: Cleon Dew, NP   No chief complaint on file.     History of Present Illness: Natasha Wright is a 56 y.o. female who presents for posts ER visit after being seen for chest pain. Other history includes anxiety, GERD, and lumbar disc disease. Last seen by cardiology in 2015, by Dr. Debara Pickett. She had a negative stress MPI. She was advised to continue with GI for GERD, and was sent for a sleep study.     Past Medical History:  Diagnosis Date  . Anxiety   . B12 deficiency    h/o  . Family history of heart disease   . GERD (gastroesophageal reflux disease)   . Hypoglycemia   . IBS (irritable bowel syndrome)   . Lumbar disc disease   . OA (osteoarthritis)   . PONV (postoperative nausea and vomiting)    needs scop patch  . Rectocele   . Spinal stenosis in cervical region     Past Surgical History:  Procedure Laterality Date  . ABDOMINAL HYSTERECTOMY  1992  . ARTHRODESIS FOOT WITH WEIL OSTEOTOMY Left 05/09/2014   Procedure: LEFT FIRST TARSAL METATARSAL  ARTHRODESIS; LEFT SECOND WEIL OSTEOTOMY;  Surgeon: Wylene Simmer, MD;  Location: [place];  Service: Orthopedics;  Laterality: Left;  . BUNIONECTOMY WITH HAMMERTOE RECONSTRUCTION Left 05/09/2014   Procedure: LEFT MODIFIED MCBRIDE BUNIONECTOMY; LEFT SECOND HAMMER TOE CORRECTION;  Surgeon: Wylene Simmer, MD;  Location: [place];  Service: Orthopedics;  Laterality: Left;  . CERVICAL SPINE SURGERY  2009  . CHOLECYSTECTOMY  2004   lap choli  . COLONOSCOPY  07/08/2003   RMR: Left-sided diverticula, remainder of colonic mucosa and terminal ileum appeared normal.. Minimally friable internal hemorrhoids, otherwise normal rectum  . COLONOSCOPY N/A 04/30/2013   Dr. Oneida Alar: mild diverticulosis in descending and sigmoid colon, internal hemorrhoids.   [name] DILATION AND  CURETTAGE OF UTERUS    . NM MYOCAR PERF WALL MOTION  10/2011   bruce myoview - breast attenuation noted in anterior region; EF 65%; no ischemia/infarct/scar; low risk  . TONSILLECTOMY  1980  . TUBAL LIGATION       Current Outpatient Prescriptions  Medication Sig Dispense Refill  . diazepam (VALIUM) 10 MG tablet Take 10 mg by mouth every 8 (eight) hours as needed (back pain).     [name] estradiol (ESTRACE) 0.5 MG tablet Take 0.5 mg by mouth daily.    . famotidine (PEPCID) 20 MG tablet One at bedtime (Patient taking differently: Take 20 mg by mouth at bedtime. One at bedtime) 39 tablet 2  . Multiple Vitamin (MULTIVITAMIN WITH MINERALS) TABS tablet Take 1 tablet by mouth daily.    . pantoprazole (PROTONIX) 40 MG tablet Take 1 tablet (40 mg total) by mouth daily. 30 minutes before breakfast 90 tablet 3   No current facility-administered medications for this visit.     Allergies:   Penicillins; Codeine; Fentanyl; Morphine; Nsaids; Sulfamethoxazole-trimethoprim; and Influenza vaccines    Social History:  The patient  reports that she quit smoking about 3 years ago. Her smoking use included Cigarettes. She has a 20.00 pack-year smoking history. She has never used smokeless tobacco. She reports that she does not drink alcohol or use drugs.   Family History:  The patient's family history includes Asthma in her maternal grandmother; Breast cancer in her maternal grandmother; Diabetes in  her maternal grandmother, mother, paternal grandmother, and sister; Down syndrome in her child; Heart disease in her child, paternal grandfather, and paternal grandmother; Hyperlipidemia in her brother, mother, and sister; Hypertension in her maternal grandmother, mother, and sister; Stroke in her maternal grandfather, maternal grandmother, and paternal grandmother; Ulcerative colitis in her father.    ROS: All other systems are reviewed and negative. Unless otherwise mentioned in H&P    PHYSICAL EXAM: VS:  There were  no vitals taken for this visit. , BMI There is no height or weight on file to calculate BMI. GEN: Well nourished, well developed, in no acute distress  HEENT: normal  Neck: no JVD, carotid bruits, or masses Cardiac: ***RRR; no murmurs, rubs, or gallops,no edema  Respiratory:  clear to auscultation bilaterally, normal work of breathing GI: soft, nontender, nondistended, + BS MS: no deformity or atrophy  Skin: warm and dry, no rash Neuro:  Strength and sensation are intact Psych: euthymic mood, full affect   EKG:  EKG {ACTION; IS/IS GEX:[id number]} ordered today. The ekg ordered today demonstrates ***   Recent Labs: 03/30/2017: BUN 13; Creatinine, Ser 0.78; Hemoglobin 14.0; Platelets 264; Potassium 3.6; Sodium 141; TSH 1.337    Lipid Panel    Component Value Date/Time   CHOL 195 06/11/2009 2347   TRIG 238 (H) 06/11/2009 2347   HDL 60 06/11/2009 2347   CHOLHDL 3.3 Ratio 06/11/2009 2347   VLDL 48 (H) 06/11/2009 2347   LDLCALC 87 06/11/2009 2347      Wt Readings from Last 3 Encounters:  03/30/17 170 lb (77.1 kg)  03/09/17 170 lb (77.1 kg)  02/09/17 170 lb (77.1 kg)      Other studies Reviewed: Additional studies/ records that were reviewed today include: ***. Review of the above records demonstrates: ***   ASSESSMENT AND PLAN:  1.  ***   Current medicines are reviewed at length with the patient today.    Labs/ tests ordered today include: *** Phill Myron. West Pugh, ANP, AACC   04/04/2017 7:51 AM    [facility] [address] [place], [place] [address] Phone: [phone]; Fax: [phone]

## 2017-04-05 ENCOUNTER — Ambulatory Visit (INDEPENDENT_AMBULATORY_CARE_PROVIDER_SITE_OTHER): Payer: Medicare Other | Admitting: Cardiovascular Disease

## 2017-04-05 ENCOUNTER — Encounter: Payer: Self-pay | Admitting: Cardiovascular Disease

## 2017-04-05 ENCOUNTER — Encounter (INDEPENDENT_AMBULATORY_CARE_PROVIDER_SITE_OTHER): Payer: Medicare Other

## 2017-04-05 VITALS — BP 122/78 | HR 87 | Ht 67.0 in | Wt 174.0 lb

## 2017-04-05 DIAGNOSIS — F439 Reaction to severe stress, unspecified: Secondary | ICD-10-CM | POA: Diagnosis not present

## 2017-04-05 DIAGNOSIS — I498 Other specified cardiac arrhythmias: Secondary | ICD-10-CM

## 2017-04-05 DIAGNOSIS — I499 Cardiac arrhythmia, unspecified: Secondary | ICD-10-CM | POA: Diagnosis not present

## 2017-04-05 DIAGNOSIS — R079 Chest pain, unspecified: Secondary | ICD-10-CM | POA: Diagnosis not present

## 2017-04-05 DIAGNOSIS — R000 Tachycardia, unspecified: Secondary | ICD-10-CM

## 2017-04-05 DIAGNOSIS — R002 Palpitations: Secondary | ICD-10-CM

## 2017-04-05 DIAGNOSIS — Z9289 Personal history of other medical treatment: Secondary | ICD-10-CM

## 2017-04-05 NOTE — Progress Notes (Signed)
CARDIOLOGY CONSULT NOTE  Patient ID: Natasha Wright MRN: [id number] DOB/AGE: [birth year] 56 y.o.  Admit date: (Not on file) Primary Physician: Redmond School, MD Referring Physician: Gerarda Fraction  Reason for Consultation: chest pain  HPI: Natasha Wright is a 56 y.o. female who is being seen today for the evaluation of chest pain at the request of Redmond School, MD.   She has a history of chest tightness with a normal nuclear stress test several years ago (02/25/14). She was evaluated by Dr. Debara Pickett in 2015.  She was evaluated in the ED for chest pain on 03/30/17. She recorded her heart rate 147 bpm at home. She had no arrhythmias on telemetry while in the ED. I personally reviewed all documentation, labs, studies. Troponins, d-dimer, basic metabolic panel, CBC, TSH, and chest x-ray were normal. There was minimal right basilar atelectasis.  I personally interpreted the ECG performed on 03/30/17 which showed sinus tachycardia, 108 bpm.  She said she lives a very stressful life. Her son who is an adult has Down's syndrome. He has atrial flutter and has undergone at least 2 ablations and also underwent ASD repair and mitral valve replacement.  On the day she presented to the ED, she had upper left-sided chest discomfort. Pulse oximeter showed heart rate 148 bpm. A few minutes later was 154 bpm. Her husband noticed that her neck veins were "jumping around".  She denies exertional chest pain and primarily has symptoms at rest. She occasionally has mild exertional dyspnea.  She has a history of multiple cervical spine surgeries and has markedly diminished range of motion. She is on disability. She also has lower back pain, hip pain, diffuse osteoarthritic pain, and bilateral feet pain and has undergone surgeries in both of her feet.  Her son almost lost his life this past winter and had pneumonia with ARDS.  She is also taking care of her mother who has Alzheimer's.      Allergies    Allergen Reactions  . Penicillins Shortness Of Breath and Rash    Has patient had a PCN reaction causing immediate rash, facial/tongue/throat swelling, SOB or lightheadedness with hypotension: Yes Has patient had a PCN reaction causing severe rash involving mucus membranes or skin necrosis: No Has patient had a PCN reaction that required hospitalization Yes Has patient had a PCN reaction occurring within the last 10 years: No If all of the above answers are "NO", then may proceed with Cephalosporin use.   . Codeine     REACTION: gi upset, skin crawling  . Fentanyl     REACTION: skin crawling, nausea, passed out  . Morphine     REACTION: GI upset, skin crawling  . Nsaids   . Sulfamethoxazole-Trimethoprim     REACTION: rash, sob  . Influenza Vaccines Rash    Current Outpatient Prescriptions  Medication Sig Dispense Refill  . diazepam (VALIUM) 10 MG tablet Take 10 mg by mouth every 8 (eight) hours as needed (back pain).     [name] estradiol (ESTRACE) 0.5 MG tablet Take 0.5 mg by mouth daily.    . famotidine (PEPCID) 20 MG tablet One at bedtime (Patient taking differently: Take 20 mg by mouth at bedtime. One at bedtime) 39 tablet 2  . Multiple Vitamin (MULTIVITAMIN WITH MINERALS) TABS tablet Take 1 tablet by mouth daily.    . pantoprazole (PROTONIX) 40 MG tablet Take 1 tablet (40 mg total) by mouth daily. 30 minutes before breakfast 90 tablet 3   No current facility-administered  medications for this visit.     Past Medical History:  Diagnosis Date  . Anxiety   . B12 deficiency    h/o  . Family history of heart disease   . GERD (gastroesophageal reflux disease)   . Hypoglycemia   . IBS (irritable bowel syndrome)   . Lumbar disc disease   . OA (osteoarthritis)   . PONV (postoperative nausea and vomiting)    needs scop patch  . Rectocele   . Spinal stenosis in cervical region     Past Surgical History:  Procedure Laterality Date  . ABDOMINAL HYSTERECTOMY  1992  . ARTHRODESIS  FOOT WITH WEIL OSTEOTOMY Left 05/09/2014   Procedure: LEFT FIRST TARSAL METATARSAL  ARTHRODESIS; LEFT SECOND WEIL OSTEOTOMY;  Surgeon: Wylene Simmer, MD;  Location: [place];  Service: Orthopedics;  Laterality: Left;  . BUNIONECTOMY WITH HAMMERTOE RECONSTRUCTION Left 05/09/2014   Procedure: LEFT MODIFIED MCBRIDE BUNIONECTOMY; LEFT SECOND HAMMER TOE CORRECTION;  Surgeon: Wylene Simmer, MD;  Location: [place];  Service: Orthopedics;  Laterality: Left;  . CERVICAL SPINE SURGERY  2009  . CHOLECYSTECTOMY  2004   lap choli  . COLONOSCOPY  07/08/2003   RMR: Left-sided diverticula, remainder of colonic mucosa and terminal ileum appeared normal.. Minimally friable internal hemorrhoids, otherwise normal rectum  . COLONOSCOPY N/A 04/30/2013   Dr. Oneida Alar: mild diverticulosis in descending and sigmoid colon, internal hemorrhoids.   [name] DILATION AND CURETTAGE OF UTERUS    . NM MYOCAR PERF WALL MOTION  10/2011   bruce myoview - breast attenuation noted in anterior region; EF 65%; no ischemia/infarct/scar; low risk  . TONSILLECTOMY  1980  . TUBAL LIGATION      Social History   Social History  . Marital status: Married    Spouse name: Mikeal Hawthorne  . Number of children: 1  . Years of education: 34   Occupational History  . Unemployed Printmaker  .  Unemployed   Social History Main Topics  . Smoking status: Former Smoker    Packs/day: 0.50    Years: 40.00    Types: Cigarettes    Quit date: 12/24/2013  . Smokeless tobacco: Never Used     Comment: using vape as aid to sustain from smoking  . Alcohol use No  . Drug use: No  . Sexual activity: Not on file   Other Topics Concern  . Not on file   Social History Narrative   One son, Down syndrome,is right handed ,resides with son and husband     No family history of premature CAD in 1st degree relatives.  Current Meds  Medication Sig  . diazepam (VALIUM) 10 MG tablet Take 10 mg by mouth every 8 (eight) hours  as needed (back pain).   [name] estradiol (ESTRACE) 0.5 MG tablet Take 0.5 mg by mouth daily.  . famotidine (PEPCID) 20 MG tablet One at bedtime (Patient taking differently: Take 20 mg by mouth at bedtime. One at bedtime)  . Multiple Vitamin (MULTIVITAMIN WITH MINERALS) TABS tablet Take 1 tablet by mouth daily.  . pantoprazole (PROTONIX) 40 MG tablet Take 1 tablet (40 mg total) by mouth daily. 30 minutes before breakfast      Review of systems complete and found to be negative unless listed above in HPI    Physical exam Pulse 87, height 5\' 7"  (1.702 m), weight 174 lb (78.9 kg), SpO2 97 %. General: NAD Neck: No JVD, no thyromegaly or thyroid nodule.  Lungs: Clear to auscultation bilaterally with  normal respiratory effort. CV: Nondisplaced PMI. Regular rate and rhythm, normal S1/S2, no S3/S4, no murmur.  No peripheral edema.  No carotid bruit.    Abdomen: Soft, nontender, no distention.  Skin: Intact without lesions or rashes.  Neurologic: Alert and oriented x 3.  Psych: Normal affect. Extremities: No clubbing or cyanosis.  HEENT: Normal.   ECG: Most recent ECG reviewed.   Labs: Lab Results  Component Value Date/Time   K 3.6 03/30/2017 04:05 PM   BUN 13 03/30/2017 04:05 PM   CREATININE 0.78 03/30/2017 04:05 PM   ALT 31 06/11/2009 11:47 PM   TSH 1.337 03/30/2017 03:51 PM   TSH 1.382 12/06/2008 12:00 AM   HGB 14.0 03/30/2017 03:51 PM     Lipids: Lab Results  Component Value Date/Time   LDLCALC 87 06/11/2009 11:47 PM   CHOL 195 06/11/2009 11:47 PM   TRIG 238 (H) 06/11/2009 11:47 PM   HDL 60 06/11/2009 11:47 PM        ASSESSMENT AND PLAN:  1. Arrhythmia/tachycardia/palpitations: I will obtain a 30 day event monitor to see if this represents sinus tachycardia versus supraventricular arrhythmia. I will order a 2-D echocardiogram with Doppler to evaluate cardiac structure, function, and regional wall motion.  2. Chest pain: The symptoms appear to have correlated with her  arrhythmia. I am obtaining a 30 day event monitor and echocardiogram.  Disposition: Follow up in 2 months  Signed: Kate Sable, M.D., F.A.C.C.  04/05/2017, 10:01 AM

## 2017-04-05 NOTE — Patient Instructions (Signed)
Your physician recommends that you schedule a follow-up appointment in: 2 months Dr Bronson Ing    Your physician recommends that you continue on your current medications as directed. Please refer to the Current Medication list given to you today.    Your physician has recommended that you wear an event monitor. Event monitors are medical devices that record the heart's electrical activity. Doctors most often [place] these monitors to diagnose arrhythmias. Arrhythmias are problems with the speed or rhythm of the heartbeat. The monitor is a small, portable device. You can wear one while you do your normal daily activities. This is usually used to diagnose what is causing palpitations/syncope (passing out).     Your physician has requested that you have an echocardiogram. Echocardiography is a painless test that uses sound waves to create images of your heart. It provides your doctor with information about the size and shape of your heart and how well your heart's chambers and valves are working. This procedure takes approximately one hour. There are no restrictions for this procedure.       Thank you for choosing [place]  !

## 2017-04-11 ENCOUNTER — Ambulatory Visit (HOSPITAL_COMMUNITY)
Admission: RE | Admit: 2017-04-11 | Discharge: 2017-04-11 | Disposition: A | Discharge status: Home / Self Care | Payer: Medicare Other | Source: Ambulatory Visit | Attending: Cardiovascular Disease | Admitting: Cardiovascular Disease

## 2017-04-11 DIAGNOSIS — R079 Chest pain, unspecified: Secondary | ICD-10-CM | POA: Diagnosis not present

## 2017-04-11 NOTE — Progress Notes (Signed)
*  PRELIMINARY RESULTS* Echocardiogram 2D Echocardiogram has been performed.  Leavy Cella 04/11/2017, 11:25 AM

## 2017-04-12 ENCOUNTER — Ambulatory Visit: Payer: Medicare Other | Admitting: Physician Assistant

## 2017-04-13 DIAGNOSIS — Z981 Arthrodesis status: Secondary | ICD-10-CM | POA: Diagnosis not present

## 2017-04-13 DIAGNOSIS — M4302 Spondylolysis, cervical region: Secondary | ICD-10-CM | POA: Diagnosis not present

## 2017-04-13 DIAGNOSIS — M47812 Spondylosis without myelopathy or radiculopathy, cervical region: Secondary | ICD-10-CM | POA: Diagnosis not present

## 2017-04-22 ENCOUNTER — Telehealth: Payer: Self-pay | Admitting: Cardiovascular Disease

## 2017-04-22 ENCOUNTER — Encounter: Payer: Self-pay | Admitting: Cardiovascular Disease

## 2017-04-22 ENCOUNTER — Encounter: Payer: Self-pay | Admitting: *Deleted

## 2017-04-22 ENCOUNTER — Ambulatory Visit (INDEPENDENT_AMBULATORY_CARE_PROVIDER_SITE_OTHER): Payer: Medicare Other | Admitting: Cardiovascular Disease

## 2017-04-22 VITALS — BP 122/78 | HR 104 | Ht 67.0 in | Wt 179.0 lb

## 2017-04-22 DIAGNOSIS — F439 Reaction to severe stress, unspecified: Secondary | ICD-10-CM

## 2017-04-22 DIAGNOSIS — R002 Palpitations: Secondary | ICD-10-CM | POA: Diagnosis not present

## 2017-04-22 DIAGNOSIS — R000 Tachycardia, unspecified: Secondary | ICD-10-CM

## 2017-04-22 DIAGNOSIS — I498 Other specified cardiac arrhythmias: Secondary | ICD-10-CM | POA: Diagnosis not present

## 2017-04-22 DIAGNOSIS — R079 Chest pain, unspecified: Secondary | ICD-10-CM

## 2017-04-22 MED ORDER — DILTIAZEM HCL 60 MG PO TABS: 60.0000 mg | ORAL_TABLET | Freq: Two times a day (BID) | ORAL | 6 refills | Status: DC

## 2017-04-22 NOTE — Telephone Encounter (Signed)
Lexiscan - chest pain April 29, 2017 at [facility]

## 2017-04-22 NOTE — Patient Instructions (Addendum)
Medication Instructions:   Begin Diltiazem 60mg  twice a day (short acting)   Continue all other medications.    Labwork: none  Testing/Procedures:  Your physician has requested that you have a lexiscan myoview. For further information please visit [email or web address]. Please follow instruction sheet, as given.  Office will contact with results via phone or letter.    Follow-Up: Keep appointment that is already scheduled for July.   Any Other Special Instructions Will Be Listed Below (If Applicable).  If you need a refill on your cardiac medications before your next appointment, please call your pharmacy.

## 2017-04-22 NOTE — Progress Notes (Signed)
SUBJECTIVE: The patient returns for follow-up after undergoing cardiovascular testing performed for the evaluation of chest pain.  Echocardiogram 04/11/17: Left ventricle: The cavity size was normal. Wall thickness was   increased in a pattern of mild LVH. Systolic function was normal.   The estimated ejection fraction was in the range of 55% to 60%.   Wall motion was normal; there were no regional wall motion   abnormalities. There was a borderline abnormality in the ratio of   early to atrial left ventricular filling.  She said she lives a very stressful life. Her son who is an adult has Down's syndrome. He has atrial flutter and has undergone at least 2 ablations and also underwent ASD repair and mitral valve replacement.  She has a history of multiple cervical spine surgeries and has markedly diminished range of motion. She is on disability. She also has lower back pain, hip pain, diffuse osteoarthritic pain, and bilateral feet pain and has undergone surgeries in both of her feet.  Her son almost lost his life this past winter and had pneumonia with ARDS.  She is also taking care of her mother who has Alzheimer's.  She presents today complaining of chest pain and rapid palpitations. This has been scaring her. She sent back her event monitor yesterday because she said "it was pulling the skin off my chest".  I reviewed some initial strips which showed sinus tachycardia with heart rates up 244 bpm. She said her heart rate was 168 bpm last Thursday.  She has had episodic dizziness as well.   She has had chest pain and chest pressure describing "someone sitting on my chest ". She asked about cardiac catheterization.   Review of Systems: As per "subjective", otherwise negative.  Allergies  Allergen Reactions  . Penicillins Shortness Of Breath and Rash    Has patient had a PCN reaction causing immediate rash, facial/tongue/throat swelling, SOB or lightheadedness with  hypotension: Yes Has patient had a PCN reaction causing severe rash involving mucus membranes or skin necrosis: No Has patient had a PCN reaction that required hospitalization Yes Has patient had a PCN reaction occurring within the last 10 years: No If all of the above answers are "NO", then may proceed with Cephalosporin use.   . Codeine     REACTION: gi upset, skin crawling  . Fentanyl     REACTION: skin crawling, nausea, passed out  . Morphine     REACTION: GI upset, skin crawling  . Nsaids   . Sulfamethoxazole-Trimethoprim     REACTION: rash, sob  . Influenza Vaccines Rash    Current Outpatient Prescriptions  Medication Sig Dispense Refill  . diazepam (VALIUM) 10 MG tablet Take 10 mg by mouth every 8 (eight) hours as needed (back pain).     [name] estradiol (ESTRACE) 0.5 MG tablet Take 0.5 mg by mouth daily.    . famotidine (PEPCID) 20 MG tablet One at bedtime (Patient taking differently: Take 20 mg by mouth at bedtime. One at bedtime) 39 tablet 2  . Multiple Vitamin (MULTIVITAMIN WITH MINERALS) TABS tablet Take 1 tablet by mouth daily.    . pantoprazole (PROTONIX) 40 MG tablet Take 1 tablet (40 mg total) by mouth daily. 30 minutes before breakfast 90 tablet 3   No current facility-administered medications for this visit.     Past Medical History:  Diagnosis Date  . Anxiety   . B12 deficiency    h/o  . Family history of heart disease   .  GERD (gastroesophageal reflux disease)   . Hypoglycemia   . IBS (irritable bowel syndrome)   . Lumbar disc disease   . OA (osteoarthritis)   . PONV (postoperative nausea and vomiting)    needs scop patch  . Rectocele   . Spinal stenosis in cervical region     Past Surgical History:  Procedure Laterality Date  . ABDOMINAL HYSTERECTOMY  1992  . ARTHRODESIS FOOT WITH WEIL OSTEOTOMY Left 05/09/2014   Procedure: LEFT FIRST TARSAL METATARSAL  ARTHRODESIS; LEFT SECOND WEIL OSTEOTOMY;  Surgeon: Wylene Simmer, MD;  Location: [place];  Service: Orthopedics;  Laterality: Left;  . BUNIONECTOMY WITH HAMMERTOE RECONSTRUCTION Left 05/09/2014   Procedure: LEFT MODIFIED MCBRIDE BUNIONECTOMY; LEFT SECOND HAMMER TOE CORRECTION;  Surgeon: Wylene Simmer, MD;  Location: [place];  Service: Orthopedics;  Laterality: Left;  . CERVICAL SPINE SURGERY  2009  . CHOLECYSTECTOMY  2004   lap choli  . COLONOSCOPY  07/08/2003   RMR: Left-sided diverticula, remainder of colonic mucosa and terminal ileum appeared normal.. Minimally friable internal hemorrhoids, otherwise normal rectum  . COLONOSCOPY N/A 04/30/2013   Dr. Oneida Alar: mild diverticulosis in descending and sigmoid colon, internal hemorrhoids.   [name] DILATION AND CURETTAGE OF UTERUS    . NM MYOCAR PERF WALL MOTION  10/2011   bruce myoview - breast attenuation noted in anterior region; EF 65%; no ischemia/infarct/scar; low risk  . TONSILLECTOMY  1980  . TUBAL LIGATION      Social History   Social History  . Marital status: Married    Spouse name: Mikeal Hawthorne  . Number of children: 1  . Years of education: 27   Occupational History  . Unemployed Printmaker  .  Unemployed   Social History Main Topics  . Smoking status: Former Smoker    Packs/day: 0.50    Years: 40.00    Types: Cigarettes    Quit date: 12/24/2013  . Smokeless tobacco: Never Used     Comment: using vape as aid to sustain from smoking  . Alcohol use No  . Drug use: No  . Sexual activity: Not on file   Other Topics Concern  . Not on file   Social History Narrative   One son, Down syndrome,is right handed ,resides with son and husband     Vitals:   04/22/17 1304  BP: 122/78  Pulse: (!) 104  SpO2: 98%  Weight: 179 lb (81.2 kg)  Height: 5\' 7"  (1.702 m)    Wt Readings from Last 3 Encounters:  04/22/17 179 lb (81.2 kg)  04/05/17 174 lb (78.9 kg)  03/30/17 170 lb (77.1 kg)     PHYSICAL EXAM General: NAD HEENT: Normal. Neck: No JVD, no thyromegaly. Lungs: Clear to  auscultation bilaterally with normal respiratory effort. CV: Nondisplaced PMI.  Regular rate and rhythm, normal S1/S2, no S3/S4, no murmur. +chest wall tenderness. No pretibial or periankle edema. +venous varicosities.  Abdomen: Soft, nontender, no distention.  Neurologic: Alert and oriented.  Psych: Normal affect. Skin: Normal. Musculoskeletal: No gross deformities.    ECG: Most recent ECG reviewed.   Labs: Lab Results  Component Value Date/Time   K 3.6 03/30/2017 04:05 PM   BUN 13 03/30/2017 04:05 PM   CREATININE 0.78 03/30/2017 04:05 PM   ALT 31 06/11/2009 11:47 PM   TSH 1.337 03/30/2017 03:51 PM   TSH 1.382 12/06/2008 12:00 AM   HGB 14.0 03/30/2017 03:51 PM     Lipids: Lab Results  Component Value Date/Time  LDLCALC 87 06/11/2009 11:47 PM   CHOL 195 06/11/2009 11:47 PM   TRIG 238 (H) 06/11/2009 11:47 PM   HDL 60 06/11/2009 11:47 PM       ASSESSMENT AND PLAN: 1. 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 I will await the final summary. I will start short-acting diltiazem 60 mg twice daily to see if she tolerates this medication. Left ventricular systolic function is normal.  2. Chest pain: While some symptoms correlate with sinus tachycardia, other symptoms occur when she is at rest area. Left ventricular systolic function and regional wall motion are normal. I am starting short acting diltiazem 60 mg twice daily to control tachycardia. I will proceed with a nuclear myocardial perfusion imaging study to evaluate for ischemic heart disease (Lexiscan Myoview).    Disposition: Follow up in July.  Time spent: 40 minutes, of which greater than 50% was spent reviewing symptoms, relevant blood tests and studies, and discussing management plan with the patient.   Kate Sable, M.D., F.A.C.C.

## 2017-04-28 DIAGNOSIS — M4302 Spondylolysis, cervical region: Secondary | ICD-10-CM | POA: Diagnosis not present

## 2017-04-29 ENCOUNTER — Encounter (HOSPITAL_COMMUNITY): Payer: Self-pay

## 2017-04-29 ENCOUNTER — Encounter (HOSPITAL_BASED_OUTPATIENT_CLINIC_OR_DEPARTMENT_OTHER)
Admission: RE | Admit: 2017-04-29 | Discharge: 2017-04-29 | Disposition: A | Discharge status: Home / Self Care | Payer: Medicare Other | Source: Ambulatory Visit | Attending: Cardiovascular Disease | Admitting: Cardiovascular Disease

## 2017-04-29 ENCOUNTER — Ambulatory Visit (HOSPITAL_COMMUNITY)
Admission: RE | Admit: 2017-04-29 | Discharge: 2017-04-29 | Disposition: A | Discharge status: Home / Self Care | Payer: Medicare Other | Source: Ambulatory Visit | Attending: Cardiovascular Disease | Admitting: Cardiovascular Disease

## 2017-04-29 DIAGNOSIS — R079 Chest pain, unspecified: Secondary | ICD-10-CM | POA: Diagnosis not present

## 2017-04-29 DIAGNOSIS — R9439 Abnormal result of other cardiovascular function study: Secondary | ICD-10-CM | POA: Diagnosis not present

## 2017-04-29 LAB — NM MYOCAR MULTI W/SPECT W/WALL MOTION / EF
CHL CUP NUCLEAR SDS: 4
CHL CUP RESTING HR STRESS: 71 {beats}/min
LHR: 0.26
LVDIAVOL: 52 mL (ref 46–106)
LVSYSVOL: 20 mL
NUC STRESS TID: 0.99
Peak HR: 110 {beats}/min
SRS: 3
SSS: 7

## 2017-04-29 MED ORDER — TECHNETIUM TC 99M TETROFOSMIN IV KIT
10.0000 | PACK | Freq: Once | INTRAVENOUS | Status: AC | PRN
  Administered 2017-04-29: 10 via INTRAVENOUS

## 2017-04-29 MED ORDER — TECHNETIUM TC 99M TETROFOSMIN IV KIT
30.0000 | PACK | Freq: Once | INTRAVENOUS | Status: AC | PRN
  Administered 2017-04-29: 30 via INTRAVENOUS

## 2017-04-29 MED ORDER — SODIUM CHLORIDE 0.9% FLUSH
INTRAVENOUS | Status: AC
  Administered 2017-04-29: 10 mL via INTRAVENOUS
  Filled 2017-04-29: qty 10

## 2017-04-29 MED ORDER — REGADENOSON 0.4 MG/5ML IV SOLN
INTRAVENOUS | Status: AC
  Administered 2017-04-29: 0.4 mg via INTRAVENOUS
  Filled 2017-04-29: qty 5

## 2017-05-10 ENCOUNTER — Other Ambulatory Visit: Payer: Self-pay | Admitting: Nurse Practitioner

## 2017-05-10 DIAGNOSIS — M4302 Spondylolysis, cervical region: Secondary | ICD-10-CM

## 2017-05-26 ENCOUNTER — Ambulatory Visit
Admission: RE | Admit: 2017-05-26 | Discharge: 2017-05-26 | Disposition: A | Discharge status: Home / Self Care | Payer: Medicare Other | Source: Ambulatory Visit | Attending: Nurse Practitioner | Admitting: Nurse Practitioner

## 2017-05-26 DIAGNOSIS — M4302 Spondylolysis, cervical region: Secondary | ICD-10-CM

## 2017-05-26 DIAGNOSIS — R600 Localized edema: Secondary | ICD-10-CM | POA: Diagnosis not present

## 2017-05-26 MED ORDER — GADOBENATE DIMEGLUMINE 529 MG/ML IV SOLN
15.0000 mL | Freq: Once | INTRAVENOUS | Status: AC | PRN
  Administered 2017-05-26: 15 mL via INTRAVENOUS

## 2017-05-30 DIAGNOSIS — M4692 Unspecified inflammatory spondylopathy, cervical region: Secondary | ICD-10-CM | POA: Diagnosis not present

## 2017-05-30 DIAGNOSIS — M545 Low back pain: Secondary | ICD-10-CM | POA: Diagnosis not present

## 2017-05-30 DIAGNOSIS — M4302 Spondylolysis, cervical region: Secondary | ICD-10-CM | POA: Diagnosis not present

## 2017-06-02 DIAGNOSIS — M47812 Spondylosis without myelopathy or radiculopathy, cervical region: Secondary | ICD-10-CM | POA: Diagnosis not present

## 2017-06-02 DIAGNOSIS — M542 Cervicalgia: Secondary | ICD-10-CM | POA: Diagnosis not present

## 2017-06-08 ENCOUNTER — Ambulatory Visit: Payer: Medicare Other | Admitting: Cardiovascular Disease

## 2017-06-09 ENCOUNTER — Encounter: Payer: Self-pay | Admitting: Cardiovascular Disease

## 2017-06-09 ENCOUNTER — Ambulatory Visit (INDEPENDENT_AMBULATORY_CARE_PROVIDER_SITE_OTHER): Payer: Medicare Other | Admitting: Cardiovascular Disease

## 2017-06-09 VITALS — BP 120/72 | HR 92 | Ht 66.5 in | Wt 173.0 lb

## 2017-06-09 DIAGNOSIS — R000 Tachycardia, unspecified: Secondary | ICD-10-CM | POA: Diagnosis not present

## 2017-06-09 DIAGNOSIS — G894 Chronic pain syndrome: Secondary | ICD-10-CM

## 2017-06-09 DIAGNOSIS — I498 Other specified cardiac arrhythmias: Secondary | ICD-10-CM

## 2017-06-09 DIAGNOSIS — R079 Chest pain, unspecified: Secondary | ICD-10-CM

## 2017-06-09 DIAGNOSIS — F439 Reaction to severe stress, unspecified: Secondary | ICD-10-CM

## 2017-06-09 NOTE — Progress Notes (Signed)
SUBJECTIVE: The patient returns for follow-up after undergoing cardiovascular testing performed for the evaluation of chest pain.  She underwent a low risk nuclear stress test on 04/29/17, LVEF 61%. There was a very small apical defect that appeared to be most consistent with variable soft tissue attenuation.  Event monitoring demonstrated sinus rhythm and sinus tachycardia.  She has multiple stressors at home detailed in my last office visit note.  She has begun walking to try to alleviate stress. Her symptoms of chest pain and palpitations have improved with diltiazem.  She has previously undergone 2 neck fusion surgeries and needs to have steroid injections. She lives with chronic pain.   Review of Systems: As per "subjective", otherwise negative.  Allergies  Allergen Reactions  . Penicillins Shortness Of Breath and Rash    Has patient had a PCN reaction causing immediate rash, facial/tongue/throat swelling, SOB or lightheadedness with hypotension: Yes Has patient had a PCN reaction causing severe rash involving mucus membranes or skin necrosis: No Has patient had a PCN reaction that required hospitalization Yes Has patient had a PCN reaction occurring within the last 10 years: No If all of the above answers are "NO", then may proceed with Cephalosporin use.   . Codeine     REACTION: gi upset, skin crawling  . Fentanyl     REACTION: skin crawling, nausea, passed out  . Morphine     REACTION: GI upset, skin crawling  . Nsaids   . Sulfamethoxazole-Trimethoprim     REACTION: rash, sob  . Influenza Vaccines Rash    Current Outpatient Prescriptions  Medication Sig Dispense Refill  . diazepam (VALIUM) 10 MG tablet Take 10 mg by mouth every 8 (eight) hours as needed (back pain).     [name] diltiazem (CARDIZEM) 60 MG tablet Take 1 tablet (60 mg total) by mouth 2 (two) times daily. 60 tablet 6  . estradiol (ESTRACE) 0.5 MG tablet Take 0.5 mg by mouth daily.    . famotidine  (PEPCID) 20 MG tablet One at bedtime (Patient taking differently: Take 20 mg by mouth at bedtime. One at bedtime) 39 tablet 2  . Multiple Vitamin (MULTIVITAMIN WITH MINERALS) TABS tablet Take 1 tablet by mouth daily.    . pantoprazole (PROTONIX) 40 MG tablet Take 1 tablet (40 mg total) by mouth daily. 30 minutes before breakfast 90 tablet 3   No current facility-administered medications for this visit.     Past Medical History:  Diagnosis Date  . Anxiety   . B12 deficiency    h/o  . Family history of heart disease   . GERD (gastroesophageal reflux disease)   . Hypoglycemia   . IBS (irritable bowel syndrome)   . Lumbar disc disease   . OA (osteoarthritis)   . PONV (postoperative nausea and vomiting)    needs scop patch  . Rectocele   . Spinal stenosis in cervical region     Past Surgical History:  Procedure Laterality Date  . ABDOMINAL HYSTERECTOMY  1992  . ARTHRODESIS FOOT WITH WEIL OSTEOTOMY Left 05/09/2014   Procedure: LEFT FIRST TARSAL METATARSAL  ARTHRODESIS; LEFT SECOND WEIL OSTEOTOMY;  Surgeon: Wylene Simmer, MD;  Location: [place];  Service: Orthopedics;  Laterality: Left;  . BUNIONECTOMY WITH HAMMERTOE RECONSTRUCTION Left 05/09/2014   Procedure: LEFT MODIFIED MCBRIDE BUNIONECTOMY; LEFT SECOND HAMMER TOE CORRECTION;  Surgeon: Wylene Simmer, MD;  Location: [place];  Service: Orthopedics;  Laterality: Left;  . CERVICAL SPINE SURGERY  2009  .  CHOLECYSTECTOMY  2004   lap choli  . COLONOSCOPY  07/08/2003   RMR: Left-sided diverticula, remainder of colonic mucosa and terminal ileum appeared normal.. Minimally friable internal hemorrhoids, otherwise normal rectum  . COLONOSCOPY N/A 04/30/2013   Dr. Oneida Alar: mild diverticulosis in descending and sigmoid colon, internal hemorrhoids.   [name] DILATION AND CURETTAGE OF UTERUS    . NM MYOCAR PERF WALL MOTION  10/2011   bruce myoview - breast attenuation noted in anterior region; EF 65%; no  ischemia/infarct/scar; low risk  . TONSILLECTOMY  1980  . TUBAL LIGATION      Social History   Social History  . Marital status: Married    Spouse name: Mikeal Hawthorne  . Number of children: 1  . Years of education: 2   Occupational History  . Unemployed Printmaker  .  Unemployed   Social History Main Topics  . Smoking status: Former Smoker    Packs/day: 0.50    Years: 40.00    Types: Cigarettes    Quit date: 12/24/2013  . Smokeless tobacco: Never Used     Comment: using vape as aid to sustain from smoking  . Alcohol use No  . Drug use: No  . Sexual activity: Not on file   Other Topics Concern  . Not on file   Social History Narrative   One son, Down syndrome,is right handed ,resides with son and husband     Vitals:   06/09/17 0836  BP: 120/72  Pulse: 92  SpO2: 98%  Weight: 173 lb (78.5 kg)  Height: 5' 6.5" (1.689 m)    Wt Readings from Last 3 Encounters:  06/09/17 173 lb (78.5 kg)  04/22/17 179 lb (81.2 kg)  04/05/17 174 lb (78.9 kg)     PHYSICAL EXAM General: NAD HEENT: Normal. Neck: No JVD, no thyromegaly. Lungs: Clear to auscultation bilaterally with normal respiratory effort. CV: Regular rate and rhythm, normal S1/S2, no S3/S4, no murmur. No pretibial or periankle edema.   Abdomen: Soft, nontender, no distention.  Neurologic: Alert and oriented.  Psych: Normal affect. Skin: Normal. Musculoskeletal: No gross deformities.    ECG: Most recent ECG reviewed.   Labs: Lab Results  Component Value Date/Time   K 3.6 03/30/2017 04:05 PM   BUN 13 03/30/2017 04:05 PM   CREATININE 0.78 03/30/2017 04:05 PM   ALT 31 06/11/2009 11:47 PM   TSH 1.337 03/30/2017 03:51 PM   TSH 1.382 12/06/2008 12:00 AM   HGB 14.0 03/30/2017 03:51 PM     Lipids: Lab Results  Component Value Date/Time   LDLCALC 87 06/11/2009 11:47 PM   CHOL 195 06/11/2009 11:47 PM   TRIG 238 (H) 06/11/2009 11:47 PM   HDL 60 06/11/2009 11:47 PM       ASSESSMENT AND  PLAN:  1. Arrhythmia/tachycardia/palpitations: Symptomatically improved. Sinus tachycardia likely provoked by chronic pain and stressors at home. I started diltiazem 60 mg twice daily at her last visit. No changes to therapy.   2. Chest pain:She underwent a low risk nuclear stress test on 04/29/17, LVEF 61%. There was a very small apical defect that appeared to be most consistent with variable soft tissue attenuation. Continue diltiazem for tachycardia.     Disposition: Follow up 1 yr  Time: 25 mins   Kate Sable, M.D., F.A.C.C.

## 2017-06-09 NOTE — Patient Instructions (Signed)
Your physician wants you to follow-up in: 1 year Dr Virgina Jock will receive a reminder letter in the mail two months in advance. If you don't receive a letter, please call our office to schedule the follow-up appointment.   Your physician recommends that you continue on your current medications as directed. Please refer to the Current Medication list given to you today.   If you need a refill on your cardiac medications before your next appointment, please call your pharmacy.     No Tests or lab work ordered.    Thank you for choosing [place] !

## 2017-06-10 DIAGNOSIS — M47812 Spondylosis without myelopathy or radiculopathy, cervical region: Secondary | ICD-10-CM | POA: Diagnosis not present

## 2017-06-10 DIAGNOSIS — M542 Cervicalgia: Secondary | ICD-10-CM | POA: Diagnosis not present

## 2017-06-23 DIAGNOSIS — K589 Irritable bowel syndrome without diarrhea: Secondary | ICD-10-CM | POA: Diagnosis not present

## 2017-06-23 DIAGNOSIS — Z0001 Encounter for general adult medical examination with abnormal findings: Secondary | ICD-10-CM | POA: Diagnosis not present

## 2017-06-23 DIAGNOSIS — K5909 Other constipation: Secondary | ICD-10-CM | POA: Diagnosis not present

## 2017-06-24 DIAGNOSIS — Z0001 Encounter for general adult medical examination with abnormal findings: Secondary | ICD-10-CM | POA: Diagnosis not present

## 2017-06-24 DIAGNOSIS — E748 Other specified disorders of carbohydrate metabolism: Secondary | ICD-10-CM | POA: Diagnosis not present

## 2017-07-14 DIAGNOSIS — M542 Cervicalgia: Secondary | ICD-10-CM | POA: Diagnosis not present

## 2017-07-14 DIAGNOSIS — M47812 Spondylosis without myelopathy or radiculopathy, cervical region: Secondary | ICD-10-CM | POA: Diagnosis not present

## 2017-08-01 DIAGNOSIS — K5902 Outlet dysfunction constipation: Secondary | ICD-10-CM | POA: Diagnosis not present

## 2017-08-01 DIAGNOSIS — N368 Other specified disorders of urethra: Secondary | ICD-10-CM | POA: Diagnosis not present

## 2017-08-01 DIAGNOSIS — N816 Rectocele: Secondary | ICD-10-CM | POA: Diagnosis not present

## 2017-08-01 DIAGNOSIS — Z79899 Other long term (current) drug therapy: Secondary | ICD-10-CM | POA: Diagnosis not present

## 2017-08-01 DIAGNOSIS — N952 Postmenopausal atrophic vaginitis: Secondary | ICD-10-CM | POA: Diagnosis not present

## 2017-08-22 DIAGNOSIS — B029 Zoster without complications: Secondary | ICD-10-CM | POA: Diagnosis not present

## 2017-08-22 DIAGNOSIS — Z1389 Encounter for screening for other disorder: Secondary | ICD-10-CM | POA: Diagnosis not present

## 2017-08-30 DIAGNOSIS — E538 Deficiency of other specified B group vitamins: Secondary | ICD-10-CM | POA: Diagnosis not present

## 2017-08-30 DIAGNOSIS — B029 Zoster without complications: Secondary | ICD-10-CM | POA: Diagnosis not present

## 2017-09-05 DIAGNOSIS — N949 Unspecified condition associated with female genital organs and menstrual cycle: Secondary | ICD-10-CM | POA: Diagnosis not present

## 2017-09-05 DIAGNOSIS — K5900 Constipation, unspecified: Secondary | ICD-10-CM | POA: Diagnosis not present

## 2017-09-21 DIAGNOSIS — G894 Chronic pain syndrome: Secondary | ICD-10-CM | POA: Diagnosis not present

## 2017-09-21 DIAGNOSIS — M1991 Primary osteoarthritis, unspecified site: Secondary | ICD-10-CM | POA: Diagnosis not present

## 2017-09-21 DIAGNOSIS — M2550 Pain in unspecified joint: Secondary | ICD-10-CM | POA: Diagnosis not present

## 2017-09-21 DIAGNOSIS — Z1389 Encounter for screening for other disorder: Secondary | ICD-10-CM | POA: Diagnosis not present

## 2017-09-22 DIAGNOSIS — M2550 Pain in unspecified joint: Secondary | ICD-10-CM | POA: Diagnosis not present

## 2017-09-22 DIAGNOSIS — I7300 Raynaud's syndrome without gangrene: Secondary | ICD-10-CM | POA: Diagnosis not present

## 2017-09-22 DIAGNOSIS — Z1389 Encounter for screening for other disorder: Secondary | ICD-10-CM | POA: Diagnosis not present

## 2017-09-26 ENCOUNTER — Ambulatory Visit (HOSPITAL_COMMUNITY)
Admission: RE | Admit: 2017-09-26 | Discharge: 2017-09-26 | Disposition: A | Discharge status: Home / Self Care | Payer: Medicare Other | Source: Ambulatory Visit | Attending: Family Medicine | Admitting: Family Medicine

## 2017-09-26 ENCOUNTER — Other Ambulatory Visit (HOSPITAL_COMMUNITY): Payer: Self-pay | Admitting: Family Medicine

## 2017-09-26 DIAGNOSIS — S90122A Contusion of left lesser toe(s) without damage to nail, initial encounter: Secondary | ICD-10-CM | POA: Diagnosis not present

## 2017-09-26 DIAGNOSIS — M79675 Pain in left toe(s): Secondary | ICD-10-CM | POA: Insufficient documentation

## 2017-09-26 DIAGNOSIS — E663 Overweight: Secondary | ICD-10-CM | POA: Insufficient documentation

## 2017-09-26 DIAGNOSIS — Z6828 Body mass index (BMI) 28.0-28.9, adult: Secondary | ICD-10-CM | POA: Diagnosis not present

## 2017-09-26 DIAGNOSIS — R52 Pain, unspecified: Secondary | ICD-10-CM

## 2017-09-26 DIAGNOSIS — S99922A Unspecified injury of left foot, initial encounter: Secondary | ICD-10-CM | POA: Diagnosis not present

## 2017-09-26 DIAGNOSIS — S90415A Abrasion, left lesser toe(s), initial encounter: Secondary | ICD-10-CM | POA: Diagnosis not present

## 2017-11-05 DIAGNOSIS — S61200A Unspecified open wound of right index finger without damage to nail, initial encounter: Secondary | ICD-10-CM | POA: Diagnosis not present

## 2017-11-12 ENCOUNTER — Other Ambulatory Visit: Payer: Self-pay | Admitting: Cardiovascular Disease

## 2017-12-01 DIAGNOSIS — L820 Inflamed seborrheic keratosis: Secondary | ICD-10-CM | POA: Diagnosis not present

## 2017-12-01 DIAGNOSIS — L650 Telogen effluvium: Secondary | ICD-10-CM | POA: Diagnosis not present

## 2017-12-01 DIAGNOSIS — H61001 Unspecified perichondritis of right external ear: Secondary | ICD-10-CM | POA: Diagnosis not present

## 2017-12-01 DIAGNOSIS — D2310 Other benign neoplasm of skin of unspecified eyelid, including canthus: Secondary | ICD-10-CM | POA: Diagnosis not present

## 2017-12-15 DIAGNOSIS — H61031 Chondritis of right external ear: Secondary | ICD-10-CM | POA: Diagnosis not present

## 2017-12-15 DIAGNOSIS — I7300 Raynaud's syndrome without gangrene: Secondary | ICD-10-CM | POA: Diagnosis not present

## 2017-12-15 DIAGNOSIS — M5030 Other cervical disc degeneration, unspecified cervical region: Secondary | ICD-10-CM | POA: Diagnosis not present

## 2017-12-30 ENCOUNTER — Other Ambulatory Visit: Payer: Self-pay | Admitting: Internal Medicine

## 2017-12-30 DIAGNOSIS — Z139 Encounter for screening, unspecified: Secondary | ICD-10-CM

## 2018-01-05 DIAGNOSIS — H61001 Unspecified perichondritis of right external ear: Secondary | ICD-10-CM | POA: Diagnosis not present

## 2018-01-05 DIAGNOSIS — L218 Other seborrheic dermatitis: Secondary | ICD-10-CM | POA: Diagnosis not present

## 2018-01-09 ENCOUNTER — Ambulatory Visit (INDEPENDENT_AMBULATORY_CARE_PROVIDER_SITE_OTHER): Payer: Medicare Other | Admitting: Otolaryngology

## 2018-01-09 DIAGNOSIS — J310 Chronic rhinitis: Secondary | ICD-10-CM | POA: Diagnosis not present

## 2018-01-09 DIAGNOSIS — R490 Dysphonia: Secondary | ICD-10-CM | POA: Diagnosis not present

## 2018-01-09 DIAGNOSIS — R1312 Dysphagia, oropharyngeal phase: Secondary | ICD-10-CM

## 2018-01-10 DIAGNOSIS — M7062 Trochanteric bursitis, left hip: Secondary | ICD-10-CM | POA: Diagnosis not present

## 2018-01-10 DIAGNOSIS — M545 Low back pain: Secondary | ICD-10-CM | POA: Diagnosis not present

## 2018-01-19 ENCOUNTER — Ambulatory Visit
Admission: RE | Admit: 2018-01-19 | Discharge: 2018-01-19 | Disposition: A | Discharge status: Home / Self Care | Payer: Medicare Other | Source: Ambulatory Visit | Attending: Internal Medicine | Admitting: Internal Medicine

## 2018-01-19 DIAGNOSIS — Z139 Encounter for screening, unspecified: Secondary | ICD-10-CM

## 2018-01-19 DIAGNOSIS — Z1231 Encounter for screening mammogram for malignant neoplasm of breast: Secondary | ICD-10-CM | POA: Diagnosis not present

## 2018-01-23 DIAGNOSIS — Z1389 Encounter for screening for other disorder: Secondary | ICD-10-CM | POA: Diagnosis not present

## 2018-01-23 DIAGNOSIS — M546 Pain in thoracic spine: Secondary | ICD-10-CM | POA: Diagnosis not present

## 2018-01-26 DIAGNOSIS — M7062 Trochanteric bursitis, left hip: Secondary | ICD-10-CM | POA: Diagnosis not present

## 2018-02-10 ENCOUNTER — Ambulatory Visit: Payer: Medicare Other | Admitting: Gastroenterology

## 2018-02-10 ENCOUNTER — Other Ambulatory Visit: Payer: Self-pay | Admitting: Gastroenterology

## 2018-02-10 ENCOUNTER — Encounter: Payer: Self-pay | Admitting: Gastroenterology

## 2018-02-10 VITALS — BP 129/78 | HR 93 | Temp 97.4°F | Ht 66.5 in | Wt 176.2 lb

## 2018-02-10 DIAGNOSIS — K580 Irritable bowel syndrome with diarrhea: Secondary | ICD-10-CM

## 2018-02-10 DIAGNOSIS — R1012 Left upper quadrant pain: Secondary | ICD-10-CM | POA: Diagnosis not present

## 2018-02-10 DIAGNOSIS — K219 Gastro-esophageal reflux disease without esophagitis: Secondary | ICD-10-CM

## 2018-02-10 DIAGNOSIS — R109 Unspecified abdominal pain: Secondary | ICD-10-CM

## 2018-02-10 NOTE — Patient Instructions (Signed)
1. Please have your labs and CT scan done. We will contact you within 10 business days after completed.  2. Take dicyclomine 30-60 minutes prior to lunch.

## 2018-02-10 NOTE — Assessment & Plan Note (Addendum)
Pain beginning greater than one month ago. More lateral than site of her prior shingles outbreak. Back to postprandial diarrhea with prior h/o IBS-D. She has pretty extensive pain throughout entire left lateral abdomen which she does not feel is similar to her typical IBS pain. She also has some pain over the left lower lateral ribs likely due to musculoskeletal component.   At this time would check labs and CT A/P with contrast. Will have her take bentyl 30-60 prior to her lunch which is her most symptomatic meal as far as diarrhea.   Further recommendations to follow.

## 2018-02-10 NOTE — Patient Instructions (Signed)
No PA needed for CT abd/pelvis w/contrast per [facility] website.

## 2018-02-10 NOTE — Progress Notes (Signed)
Primary Care Physician: Redmond School, MD  Primary Gastroenterologist:  Barney Drain, MD   Chief Complaint  Patient presents with  . left side pain    radiates down to pelvic area. Comes/goes x few months  . Diarrhea    5 times per week  . gas/bloating    HPI: Natasha Wright is a 57 y.o. female here for further evaluation of left-sided abdominal pain, diarrhea, bloating.  She was last seen in January 2018.  At that time she was having more constipation.  Rectal bleeding and tcs offered but patient did not want to pursue at that time due to son's medical issues. Her last colonoscopy was in 2014 with diverticulosis and internal hemorrhoids.  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   Shingles in 08/2017. Three lesions in LUQ. Had post-herpetic neuralgia for 3 months. Symptoms finally went away. One month later she noticed pain in the left flank going all the way down to the LLQ. Gas/growling/gurgling noises. C/o postprandial loose stools mostly after lunch. Eats yogurt at lunch time. Notes also on Sat/Sun. At first thought it was the eggs but happens even though she cut out the eggs. Doesn't happen with cheese. Does not drink milk. Abdominal pain irregardless of meals or BMs. Nothing makes better or worse. Not positional. Still having a lot of indigestion especially after supper. Regurgitation with bending over. Pantoprazole does not control symptoms. Added rolaids two daily. No dysphagia. Bentyl when diarrhea real bad and does seem to help. No melena, brbpr. No injury to explain flank pain.   Dr. Gerarda Fraction did u/s to rule out evidence of UTI or kidney stones.     Current Outpatient Medications  Medication Sig Dispense Refill  . diazepam (VALIUM) 10 MG tablet Take 10 mg by mouth every 8 (eight) hours as needed (back pain).     [name]  diltiazem (CARDIZEM) 60 MG tablet TAKE 1 TABLET BY MOUTH TWICE DAILY 60 tablet 3  . estradiol (ESTRACE) 0.5 MG tablet Take 0.5 mg by mouth daily.    . famotidine (PEPCID) 20 MG tablet One at bedtime (Patient taking differently: Take 20 mg by mouth at bedtime. One at bedtime) 39 tablet 2  . Multiple Vitamin (MULTIVITAMIN WITH MINERALS) TABS tablet Take 1 tablet by mouth daily.    . pantoprazole (PROTONIX) 40 MG tablet Take 1 tablet (40 mg total) by mouth daily. 30 minutes before breakfast 90 tablet 3   No current facility-administered medications for this visit.     Allergies as of 02/10/2018 - Review Complete 02/10/2018  Allergen Reaction Noted  . Penicillins Shortness Of Breath and Rash   . Codeine    . Fentanyl    . Morphine    . Nsaids  04/23/2013  . Sulfamethoxazole-trimethoprim    . Influenza vaccines Rash 03/06/2014    ROS:  General: Negative for anorexia, weight loss, fever, chills, fatigue, weakness. ENT: Negative for hoarseness, difficulty swallowing , nasal congestion. CV: Negative for chest pain, angina, palpitations, dyspnea on exertion, peripheral edema.  Respiratory: Negative for dyspnea at rest, dyspnea on exertion, cough, sputum, wheezing.  GI: See history of present illness. GU:  Negative for dysuria, hematuria, urinary incontinence, urinary frequency, nocturnal urination.  Endo: Negative for unusual weight change.    Physical Examination:   BP 129/78   Pulse 93   Temp (!)  97.4 F (36.3 C) (Oral)   Ht 5' 6.5" (1.689 m)   Wt 176 lb 3.2 oz (79.9 kg)   BMI 28.01 kg/m   General: Well-nourished, well-developed in no acute distress.  Eyes: No icterus. Mouth: Oropharyngeal mucosa moist and pink , no lesions erythema or exudate. Lungs: Clear to auscultation bilaterally.  Heart: Regular rate and rhythm, no murmurs rubs or gallops.  Abdomen: Bowel sounds are normal, mild to moderate tenderness noted with deep palpation of left upper quadrant/left flank and extends  to LLQ. She has some mild tenderness with palpation over the left lower lateral ribs.  nondistended, no hepatosplenomegaly or masses, no abdominal bruits or hernia , no rebound or guarding.   Extremities: No lower extremity edema. No clubbing or deformities. Neuro: Alert and oriented x 4   Skin: Warm and dry, no jaundice.   Psych: Alert and cooperative, normal mood and affect.   Imaging Studies: Mm Screening Breast Tomo Bilateral  Result Date: 01/19/2018 CLINICAL DATA:  Screening. EXAM: DIGITAL SCREENING BILATERAL MAMMOGRAM WITH TOMO AND CAD COMPARISON:  Previous exam(s). ACR Breast Density Category b: There are scattered areas of fibroglandular density. FINDINGS: There are no findings suspicious for malignancy. Images were processed with CAD. IMPRESSION: No mammographic evidence of malignancy. A result letter of this screening mammogram will be mailed directly to the patient. RECOMMENDATION: Screening mammogram in one year. (Code:SM-B-01Y) BI-RADS CATEGORY  1: Negative. Electronically Signed   By: Everlean Alstrom M.D.   On: 01/19/2018 13:41

## 2018-02-10 NOTE — Assessment & Plan Note (Signed)
Currently symptomatic. Continue pantoprazole for now. Consider change in PPI in near future. Await labs and CT.

## 2018-02-11 LAB — CBC WITH DIFFERENTIAL/PLATELET
BASOS: 1 %
Basophils Absolute: 0 10*3/uL (ref 0.0–0.2)
EOS (ABSOLUTE): 0.1 10*3/uL (ref 0.0–0.4)
Eos: 1 %
HEMATOCRIT: 42 % (ref 34.0–46.6)
Hemoglobin: 14.6 g/dL (ref 11.1–15.9)
IMMATURE GRANS (ABS): 0 10*3/uL (ref 0.0–0.1)
Immature Granulocytes: 0 %
LYMPHS: 31 %
Lymphocytes Absolute: 2.1 10*3/uL (ref 0.7–3.1)
MCH: 33 pg (ref 26.6–33.0)
MCHC: 34.8 g/dL (ref 31.5–35.7)
MCV: 95 fL (ref 79–97)
MONOS ABS: 0.4 10*3/uL (ref 0.1–0.9)
Monocytes: 5 %
NEUTROS ABS: 4.1 10*3/uL (ref 1.4–7.0)
Neutrophils: 62 %
PLATELETS: 291 10*3/uL (ref 150–379)
RBC: 4.43 x10E6/uL (ref 3.77–5.28)
RDW: 13.3 % (ref 12.3–15.4)
WBC: 6.6 10*3/uL (ref 3.4–10.8)

## 2018-02-11 LAB — COMPREHENSIVE METABOLIC PANEL
A/G RATIO: 2 (ref 1.2–2.2)
ALT: 18 IU/L (ref 0–32)
AST: 18 IU/L (ref 0–40)
Albumin: 4.9 g/dL (ref 3.5–5.5)
Alkaline Phosphatase: 80 IU/L (ref 39–117)
BILIRUBIN TOTAL: 0.4 mg/dL (ref 0.0–1.2)
BUN / CREAT RATIO: 16 (ref 9–23)
BUN: 14 mg/dL (ref 6–24)
CHLORIDE: 101 mmol/L (ref 96–106)
CO2: 23 mmol/L (ref 20–29)
Calcium: 9.7 mg/dL (ref 8.7–10.2)
Creatinine, Ser: 0.87 mg/dL (ref 0.57–1.00)
GFR calc non Af Amer: 74 mL/min/{1.73_m2} (ref >59)
GFR, EST AFRICAN AMERICAN: 85 mL/min/{1.73_m2} (ref >59)
GLOBULIN, TOTAL: 2.4 g/dL (ref 1.5–4.5)
Glucose: 83 mg/dL (ref 65–99)
POTASSIUM: 4.8 mmol/L (ref 3.5–5.2)
SODIUM: 141 mmol/L (ref 134–144)
TOTAL PROTEIN: 7.3 g/dL (ref 6.0–8.5)

## 2018-02-11 LAB — LIPASE: Lipase: 26 U/L (ref 14–72)

## 2018-02-13 ENCOUNTER — Telehealth: Payer: Self-pay

## 2018-02-13 NOTE — Telephone Encounter (Signed)
[employer] labs received via fax and placed in [facility] box for review.

## 2018-02-13 NOTE — Progress Notes (Signed)
cc'ed to pcp °

## 2018-02-17 NOTE — Progress Notes (Signed)
Please let patient know her labs are normal. Await CT.

## 2018-02-20 NOTE — Progress Notes (Signed)
LM[facility] labs normal and to call if questions.  

## 2018-02-23 ENCOUNTER — Ambulatory Visit (HOSPITAL_COMMUNITY)
Admission: RE | Admit: 2018-02-23 | Discharge: 2018-02-23 | Disposition: A | Discharge status: Home / Self Care | Payer: Medicare Other | Source: Ambulatory Visit | Attending: Gastroenterology | Admitting: Gastroenterology

## 2018-02-23 ENCOUNTER — Other Ambulatory Visit: Payer: Self-pay | Admitting: Gastroenterology

## 2018-02-23 DIAGNOSIS — R1012 Left upper quadrant pain: Secondary | ICD-10-CM | POA: Diagnosis not present

## 2018-02-23 DIAGNOSIS — M47816 Spondylosis without myelopathy or radiculopathy, lumbar region: Secondary | ICD-10-CM | POA: Insufficient documentation

## 2018-02-23 DIAGNOSIS — K5730 Diverticulosis of large intestine without perforation or abscess without bleeding: Secondary | ICD-10-CM | POA: Insufficient documentation

## 2018-02-23 DIAGNOSIS — R1032 Left lower quadrant pain: Secondary | ICD-10-CM | POA: Diagnosis not present

## 2018-02-23 DIAGNOSIS — R197 Diarrhea, unspecified: Secondary | ICD-10-CM | POA: Diagnosis not present

## 2018-02-23 MED ORDER — METRONIDAZOLE 500 MG PO TABS: 500.0000 mg | ORAL_TABLET | Freq: Three times a day (TID) | ORAL | 0 refills | Status: AC

## 2018-02-23 MED ORDER — CIPROFLOXACIN HCL 500 MG PO TABS: 500.0000 mg | ORAL_TABLET | Freq: Two times a day (BID) | ORAL | 0 refills | Status: AC

## 2018-02-23 MED ORDER — IOPAMIDOL (ISOVUE-300) INJECTION 61%
100.0000 mL | Freq: Once | INTRAVENOUS | Status: AC | PRN
  Administered 2018-02-23: 100 mL via INTRAVENOUS

## 2018-02-23 NOTE — Progress Notes (Signed)
PT is aware and forwarding to [facility] to schedule appt.

## 2018-02-23 NOTE — Progress Notes (Signed)
Please let patient know that CT is suggestive of mild diverticulitis in the left colon. Given her left sided abd pain, we will treat.   Patient needs Cipro and Flagy for 10 days. I will send in RX. Call with progress reports once complete.   Ov with slf in 2 months.

## 2018-02-27 ENCOUNTER — Encounter: Payer: Self-pay | Admitting: Gastroenterology

## 2018-02-27 NOTE — Progress Notes (Signed)
Patient scheduled and letter sent  °

## 2018-03-09 ENCOUNTER — Ambulatory Visit (INDEPENDENT_AMBULATORY_CARE_PROVIDER_SITE_OTHER): Payer: Medicare Other | Admitting: Otolaryngology

## 2018-03-09 DIAGNOSIS — J343 Hypertrophy of nasal turbinates: Secondary | ICD-10-CM | POA: Diagnosis not present

## 2018-03-09 DIAGNOSIS — J310 Chronic rhinitis: Secondary | ICD-10-CM | POA: Diagnosis not present

## 2018-03-09 DIAGNOSIS — M4302 Spondylolysis, cervical region: Secondary | ICD-10-CM | POA: Diagnosis not present

## 2018-03-10 ENCOUNTER — Other Ambulatory Visit: Payer: Self-pay | Admitting: Neurosurgery

## 2018-03-10 DIAGNOSIS — G514 Facial myokymia: Secondary | ICD-10-CM

## 2018-03-13 ENCOUNTER — Other Ambulatory Visit: Payer: Self-pay | Admitting: Cardiovascular Disease

## 2018-03-13 MED ORDER — DILTIAZEM HCL 60 MG PO TABS: 60.0000 mg | ORAL_TABLET | Freq: Two times a day (BID) | ORAL | 3 refills | Status: DC

## 2018-03-13 NOTE — Telephone Encounter (Signed)
escribed diltiazem as requestes

## 2018-03-13 NOTE — Telephone Encounter (Signed)
Patient is requesting new RX for Diltiazem 90 day supply sent to [facility] / tg

## 2018-03-15 DIAGNOSIS — I872 Venous insufficiency (chronic) (peripheral): Secondary | ICD-10-CM | POA: Diagnosis not present

## 2018-03-16 ENCOUNTER — Other Ambulatory Visit (HOSPITAL_COMMUNITY): Payer: Self-pay | Admitting: Physician Assistant

## 2018-03-16 DIAGNOSIS — E2839 Other primary ovarian failure: Secondary | ICD-10-CM

## 2018-03-19 ENCOUNTER — Ambulatory Visit
Admission: RE | Admit: 2018-03-19 | Discharge: 2018-03-19 | Disposition: A | Discharge status: Home / Self Care | Payer: Medicare Other | Source: Ambulatory Visit | Attending: Neurosurgery | Admitting: Neurosurgery

## 2018-03-19 DIAGNOSIS — G514 Facial myokymia: Secondary | ICD-10-CM

## 2018-03-19 DIAGNOSIS — R29810 Facial weakness: Secondary | ICD-10-CM | POA: Diagnosis not present

## 2018-03-20 ENCOUNTER — Other Ambulatory Visit: Payer: Self-pay

## 2018-03-20 MED ORDER — DILTIAZEM HCL 60 MG PO TABS: 60.0000 mg | ORAL_TABLET | Freq: Two times a day (BID) | ORAL | 3 refills | Status: DC

## 2018-03-20 NOTE — Telephone Encounter (Signed)
Refilled diltiazem to [facility] per fax request

## 2018-03-21 ENCOUNTER — Other Ambulatory Visit: Payer: Medicare Other

## 2018-03-21 ENCOUNTER — Encounter: Payer: Self-pay | Admitting: Vascular Surgery

## 2018-03-21 ENCOUNTER — Ambulatory Visit: Payer: Medicare Other | Admitting: Vascular Surgery

## 2018-03-21 VITALS — BP 125/69 | HR 106 | Temp 97.1°F | Resp 18 | Ht 68.0 in | Wt 175.8 lb

## 2018-03-21 DIAGNOSIS — I83891 Varicose veins of right lower extremities with other complications: Secondary | ICD-10-CM | POA: Diagnosis not present

## 2018-03-21 NOTE — Progress Notes (Signed)
Subjective:     Patient ID: Natasha Wright, female   DOB: 1961/03/16, 57 y.o.   MRN: [id number]  HPI This 57 year old female was referred by Dr. Hilma Favors for evaluation of painful varicose veins in the right leg.  Last week the patient noticed a cluster of prominent veins in the lower lateral right thigh became red and tender and this resolved in a few days.  She has had spider and reticular veins with small varicose vein in the lateral aspect of the right thigh for several months.  She also has some spider veins in the lateral thighs and medial thigh areas right worse than left.  She has no history of DVT thrombophlebitis stasis ulcers or bleeding.  She does not wear elastic compression stockings.  She was concerned about this event and wanted evaluation.  Past Medical History:  Diagnosis Date  . Anxiety   . B12 deficiency    h/o  . Family history of heart disease   . GERD (gastroesophageal reflux disease)   . Hypoglycemia   . IBS (irritable bowel syndrome)   . Lumbar disc disease   . OA (osteoarthritis)   . PONV (postoperative nausea and vomiting)    needs scop patch  . Rectocele   . Spinal stenosis in cervical region     Social History   Tobacco Use  . Smoking status: Former Smoker    Packs/day: 0.50    Years: 40.00    Pack years: 20.00    Types: Cigarettes    Last attempt to quit: 12/24/2013    Years since quitting: 4.2  . Smokeless tobacco: Never Used  . Tobacco comment: using vape as aid to sustain from smoking  Substance Use Topics  . Alcohol use: No    Family History  Problem Relation Age of Onset  . Diabetes Mother   . Hypertension Mother   . Hyperlipidemia Mother   . Ulcerative colitis Father   . Breast cancer Maternal Grandmother   . Hypertension Maternal Grandmother   . Diabetes Maternal Grandmother   . Stroke Maternal Grandmother   . Asthma Maternal Grandmother   . Stroke Maternal Grandfather   . Stroke Paternal Grandmother   . Diabetes Paternal  Grandmother   . Heart disease Paternal Grandmother   . Heart disease Paternal Grandfather   . Hyperlipidemia Brother   . Hyperlipidemia Sister   . Hypertension Sister   . Diabetes Sister   . Down syndrome Child   . Heart disease Child   . Colon cancer Neg Hx     Allergies  Allergen Reactions  . Penicillins Shortness Of Breath and Rash    Has patient had a PCN reaction causing immediate rash, facial/tongue/throat swelling, SOB or lightheadedness with hypotension: Yes Has patient had a PCN reaction causing severe rash involving mucus membranes or skin necrosis: No Has patient had a PCN reaction that required hospitalization Yes Has patient had a PCN reaction occurring within the last 10 years: No If all of the above answers are "NO", then may proceed with Cephalosporin use.   . Codeine     REACTION: gi upset, skin crawling  . Fentanyl     REACTION: skin crawling, nausea, passed out  . Morphine     REACTION: GI upset, skin crawling  . Nsaids   . Sulfamethoxazole-Trimethoprim     REACTION: rash, sob  . Influenza Vaccines Rash     Current Outpatient Medications:  .  diazepam (VALIUM) 10 MG tablet, Take 10 mg by mouth every  8 (eight) hours as needed (back pain). , Disp: , Rfl:  .  diltiazem (CARDIZEM) 60 MG tablet, Take 1 tablet (60 mg total) by mouth 2 (two) times daily., Disp: 180 tablet, Rfl: 3 .  estradiol (ESTRACE) 0.5 MG tablet, Take 0.5 mg by mouth daily., Disp: , Rfl:  .  famotidine (PEPCID) 20 MG tablet, One at bedtime (Patient taking differently: Take 20 mg by mouth at bedtime. One at bedtime), Disp: 39 tablet, Rfl: 2 .  fluticasone (FLONASE) 50 MCG/ACT nasal spray, Place 1 spray into both nostrils 2 (two) times daily., Disp: , Rfl:  .  Multiple Vitamin (MULTIVITAMIN WITH MINERALS) TABS tablet, Take 1 tablet by mouth daily., Disp: , Rfl:  .  pantoprazole (PROTONIX) 40 MG tablet, Take 1 tablet (40 mg total) by mouth daily. 30 minutes before breakfast (Patient not taking:  Reported on 03/21/2018), Disp: 90 tablet, Rfl: 3  Vitals:   03/21/18 1424  BP: 125/69  Pulse: (!) 106  Resp: 18  Temp: (!) 97.1 F (36.2 C)  TempSrc: Oral  SpO2: 98%  Weight: 175 lb 12.8 oz (79.7 kg)  Height: 5\' 8"  (1.727 m)    Body mass index is 26.73 kg/m.         Review of Systems Patient has history of rapid heart rate, asymmetrical left facial weakness recently evaluated by Dr. Carloyn Manner, GERD, arthritis, cervical spine disease.    Objective:   Physical Exam BP 125/69 (BP Location: Right Arm, Patient Position: Sitting, Cuff Size: Normal)   Pulse (!) 106   Temp (!) 97.1 F (36.2 C) (Oral)   Resp 18   Ht 5\' 8"  (1.727 m)   Wt 175 lb 12.8 oz (79.7 kg)   SpO2 98%   BMI 26.73 kg/m     Gen.-alert and oriented x3 in no apparent distress HEENT normal for age Lungs no rhonchi or wheezing Cardiovascular regular rhythm no murmurs carotid pulses 3+ palpable no bruits audible Abdomen soft nontender no palpable masses Musculoskeletal free of  major deformities Skin clear -no rashes Neurologic normal Lower extremities 3+ femoral and dorsalis pedis pulses palpable bilaterally with no edema The right leg has a small cluster of reticular veins in the distal lateral thigh.  There is a linear small varicose vein about 7 to 8 cm in length feeding into this area.  There also some diffuse spider veins in the lateral and medial thigh areas with a few spider veins in the contralateral left leg on the lateral thigh.  There is no hyperpigmentation ulceration or evidence of bulging varicosities bilaterally.  Today I performed a bedside SonoSite ultrasound exam of the right leg and the right great saphenous vein is normal in caliber with no evidence of reflux       Assessment:     Spider veins and small varicose vein right lateral distal thigh with no evidence of gross reflux or enlargement of right great saphenous vein system History of tachycardia Cervical spine disease     Plan:      Explained to patient that her great saphenous vein did not need to be treated with the laser ablation and that treatment of her small varicosities and spider veins would be foam sclerotherapy if she chose to do so She discussed this today with Kathlee Nations wert will decide if she would like to proceed

## 2018-03-23 ENCOUNTER — Other Ambulatory Visit: Payer: Self-pay | Admitting: Cardiovascular Disease

## 2018-03-23 DIAGNOSIS — M4302 Spondylolysis, cervical region: Secondary | ICD-10-CM | POA: Diagnosis not present

## 2018-03-23 DIAGNOSIS — G514 Facial myokymia: Secondary | ICD-10-CM | POA: Diagnosis not present

## 2018-03-23 MED ORDER — DILTIAZEM HCL 60 MG PO TABS: 60.0000 mg | ORAL_TABLET | Freq: Two times a day (BID) | ORAL | 3 refills | Status: DC

## 2018-03-23 NOTE — Telephone Encounter (Signed)
Pt called stating [facility] didn't receive her Rx refill for diltiazem (CARDIZEM) 60 MG tablet [[id number]]  Can be called in to [phone]

## 2018-03-23 NOTE — Telephone Encounter (Signed)
Pharmacy changed to [facility], faxed refill for cardizem

## 2018-03-24 ENCOUNTER — Telehealth: Payer: Self-pay | Admitting: Cardiovascular Disease

## 2018-03-24 ENCOUNTER — Other Ambulatory Visit: Payer: Self-pay | Admitting: Cardiovascular Disease

## 2018-03-24 MED ORDER — DILTIAZEM HCL 60 MG PO TABS: 60.0000 mg | ORAL_TABLET | Freq: Two times a day (BID) | ORAL | 3 refills | Status: DC

## 2018-03-24 NOTE — Telephone Encounter (Signed)
Refill complete per request

## 2018-03-24 NOTE — Telephone Encounter (Signed)
Needs Diltizaem sent to [facility] here in [place]. 90 day supply / tg

## 2018-04-21 ENCOUNTER — Ambulatory Visit (HOSPITAL_COMMUNITY)
Admission: RE | Admit: 2018-04-21 | Discharge: 2018-04-21 | Disposition: A | Discharge status: Home / Self Care | Payer: Medicare Other | Source: Ambulatory Visit | Attending: Physician Assistant | Admitting: Physician Assistant

## 2018-04-21 DIAGNOSIS — E2839 Other primary ovarian failure: Secondary | ICD-10-CM | POA: Diagnosis present

## 2018-04-21 DIAGNOSIS — Z780 Asymptomatic menopausal state: Secondary | ICD-10-CM | POA: Diagnosis not present

## 2018-04-21 DIAGNOSIS — M85851 Other specified disorders of bone density and structure, right thigh: Secondary | ICD-10-CM | POA: Insufficient documentation

## 2018-05-09 DIAGNOSIS — M2141 Flat foot [pes planus] (acquired), right foot: Secondary | ICD-10-CM | POA: Diagnosis not present

## 2018-05-09 DIAGNOSIS — M659 Synovitis and tenosynovitis, unspecified: Secondary | ICD-10-CM | POA: Diagnosis not present

## 2018-05-17 ENCOUNTER — Encounter: Payer: Self-pay | Admitting: Family Medicine

## 2018-05-17 ENCOUNTER — Ambulatory Visit: Payer: Medicare Other | Admitting: Family Medicine

## 2018-05-17 DIAGNOSIS — M2142 Flat foot [pes planus] (acquired), left foot: Secondary | ICD-10-CM | POA: Diagnosis not present

## 2018-05-17 DIAGNOSIS — M2141 Flat foot [pes planus] (acquired), right foot: Secondary | ICD-10-CM | POA: Diagnosis not present

## 2018-05-17 NOTE — Progress Notes (Signed)
   HPI  CC: Custom orthotics Patient is here from referral by Dr. Noemi Chapel for custom orthotics.  Patient has an extensive foot pain history as well as a significant musculoskeletal and surgical history in general.  She is reporting right ankle pain and swelling that is relatively subacute.  Dr. Noemi Chapel provided a intra-articular steroid injection which has helped some of the swelling, but he has asked that she get custom orthotics to help with stability of the foot.  Pain is located along the lateral aspect of the right foot.  Patient also has more generalized midfoot pain of the left foot.  No weakness, numbness, or paresthesias.  Medications/Interventions Tried: Steroid injection, relative rest, NSAIDs, OTC inserts.  See HPI and/or previous note for associated ROS.  Objective: BP 125/74   Ht 5\' 7"  (1.702 m)   Wt 175 lb (79.4 kg)   BMI 27.41 kg/m  Gen: NAD, well groomed, a/o x3, normal affect.  CV: Well-perfused. Warm.  Resp: Non-labored.  Neuro: Sensation intact throughout. No gross coordination deficits.  Gait: Nonpathologic posture, unremarkable stride without signs of limp or balance issues. Ankle/Foot, Bilateral: TTP noted at the anterior aspect of the lateral maleolous.  Mild swelling present.  No visible erythema, ecchymosis, or bony deformity. Notable pes planus deformity bilaterally. Right sided tibiotalar subluxation with navicular subluxation noted.  Left-sided midfoot valgus present; Range of motion is full in all directions. Strength is 5/5 in all directions. No peroneal tendon tenderness or subluxation; Stable lateral and medial ligaments; Talar dome nontender; No tenderness over the navicular prominence; No tenderness at the distal metatarsals; Able to walk 4 steps.   Custom Orthotics: - Patient was fitted for a standard, cushioned, semi-rigid orthotic. - Orthotic was heated and afterward the patient stood on the orthotic blank positioned on the orthotic stand. - The patient  was positioned in subtalar neutral position and 10 degrees of ankle dorsiflexion in a weight bearing stance. - After completion of molding, a stable base was applied to the orthotic blank. - The blank was ground to a stable position for weight bearing. - Size: 9 - Base: Blue EVA - Additional Posting and Padding: None - The patient ambulated in these, and they were very comfortable.  Assessment and plan:  Bilateral pes planus Patient is here with bilateral pes planus.  Referred to [place] today by Dr. Noemi Chapel for custom orthotics.  Exam yielded bilateral pes planus with left foot midfoot valgus deviation, and right-sided navicular subluxation with tibiotalar valgus. -Custom orthotics made today.  Adequate correction to patient's pronation and forefoot valgus were demonstrated with patient wearing these. -Patient ambulated in these and endorsed comfort. -Follow-up with Dr. Noemi Chapel as instructed; follow-up here PRN   I spent 30 minutes with this patient. Over 50% of visit was spent in counseling and coordination of care for problems with pes planus.   Elberta Leatherwood, MD,MS [facility] Fellow 05/17/2018 4:53 PM

## 2018-05-17 NOTE — Assessment & Plan Note (Signed)
Patient is here with bilateral pes planus.  Referred to [place] today by Dr. Noemi Chapel for custom orthotics.  Exam yielded bilateral pes planus with left foot midfoot valgus deviation, and right-sided navicular subluxation with tibiotalar valgus. -Custom orthotics made today.  Adequate correction to patient's pronation and forefoot valgus were demonstrated with patient wearing these. -Patient ambulated in these and endorsed comfort. -Follow-up with Dr. Noemi Chapel as instructed; follow-up here PRN

## 2018-05-18 ENCOUNTER — Encounter: Payer: Self-pay | Admitting: Cardiovascular Disease

## 2018-05-18 ENCOUNTER — Ambulatory Visit: Payer: Medicare Other | Admitting: Cardiovascular Disease

## 2018-05-18 VITALS — BP 116/72 | HR 89 | Ht 67.5 in | Wt 176.0 lb

## 2018-05-18 DIAGNOSIS — R079 Chest pain, unspecified: Secondary | ICD-10-CM | POA: Diagnosis not present

## 2018-05-18 DIAGNOSIS — G894 Chronic pain syndrome: Secondary | ICD-10-CM

## 2018-05-18 DIAGNOSIS — R000 Tachycardia, unspecified: Secondary | ICD-10-CM | POA: Diagnosis not present

## 2018-05-18 DIAGNOSIS — F439 Reaction to severe stress, unspecified: Secondary | ICD-10-CM

## 2018-05-18 DIAGNOSIS — R002 Palpitations: Secondary | ICD-10-CM | POA: Diagnosis not present

## 2018-05-18 NOTE — Progress Notes (Signed)
SUBJECTIVE: The patient presents for follow-up of tachycardia and palpitations.  She has a lot of stress at home taking care of her adult son who has Down syndrome.  She has a history of multiple cervical spine surgeries and has markedly diminished range of motion and is on disability.  She also has lower back pain, hip pain, diffuse osteoarthritic pain, and bilateral feet pain and is undergone surgeries in both of her feet.  ECG performed in the office today which I ordered and personally interpreted demonstrates normal sinus rhythm with no ischemic ST segment or T-wave abnormalities, nor any arrhythmias.  She has been having more palpitations awakening her from sleep at night.  Upon further discussion, it appears she is having severe neck pain which triggers tachycardia and palpitations.  She said all the hardware has shifted to the right.  She does not want to undergo another surgery.  Since I last saw her she was diagnosed with shingles in October 2018 and also developed ray nods but sickly in her fingers.  She has monitor her heart rate at home with heart rates between 90-139 bpm.  She also has chest wall pain.  She wants to exercise and lose weight.  Due to difficulties with her feet she has had problems walking.    Family history: She has an adult son who has Down syndrome.  He has atrial flutter and has undergone at least 2 ablations and also underwent ASD repair and mitral valve replacement.  Review of Systems: As per "subjective", otherwise negative.  Allergies  Allergen Reactions  . Penicillins Shortness Of Breath and Rash    Has patient had a PCN reaction causing immediate rash, facial/tongue/throat swelling, SOB or lightheadedness with hypotension: Yes Has patient had a PCN reaction causing severe rash involving mucus membranes or skin necrosis: No Has patient had a PCN reaction that required hospitalization Yes Has patient had a PCN reaction occurring within the  last 10 years: No If all of the above answers are "NO", then may proceed with Cephalosporin use.   . Codeine     REACTION: gi upset, skin crawling  . Fentanyl     REACTION: skin crawling, nausea, passed out  . Morphine     REACTION: GI upset, skin crawling  . Nsaids   . Sulfamethoxazole-Trimethoprim     REACTION: rash, sob  . Influenza Vaccines Rash    Current Outpatient Medications  Medication Sig Dispense Refill  . diazepam (VALIUM) 10 MG tablet Take 10 mg by mouth every 8 (eight) hours as needed (back pain).     [name] diltiazem (CARDIZEM) 60 MG tablet Take 1 tablet (60 mg total) by mouth 2 (two) times daily. 180 tablet 3  . estradiol (ESTRACE) 0.5 MG tablet Take 0.5 mg by mouth daily.    . famotidine (PEPCID) 20 MG tablet One at bedtime (Patient taking differently: Take 20 mg by mouth at bedtime. One at bedtime) 39 tablet 2  . fluticasone (FLONASE) 50 MCG/ACT nasal spray Place 1 spray into both nostrils 2 (two) times daily.    . Multiple Vitamin (MULTIVITAMIN WITH MINERALS) TABS tablet Take 1 tablet by mouth daily.    . pantoprazole (PROTONIX) 40 MG tablet Take 1 tablet (40 mg total) by mouth daily. 30 minutes before breakfast 90 tablet 3   No current facility-administered medications for this visit.     Past Medical History:  Diagnosis Date  . Anxiety   . B12 deficiency    h/o  .  Family history of heart disease   . GERD (gastroesophageal reflux disease)   . Hypoglycemia   . IBS (irritable bowel syndrome)   . Lumbar disc disease   . OA (osteoarthritis)   . PONV (postoperative nausea and vomiting)    needs scop patch  . Rectocele   . Shingles   . Spinal stenosis in cervical region     Past Surgical History:  Procedure Laterality Date  . ABDOMINAL HYSTERECTOMY  1992  . ARTHRODESIS FOOT WITH WEIL OSTEOTOMY Left 05/09/2014   Procedure: LEFT FIRST TARSAL METATARSAL  ARTHRODESIS; LEFT SECOND WEIL OSTEOTOMY;  Surgeon: Wylene Simmer, MD;  Location: [place];   Service: Orthopedics;  Laterality: Left;  . BUNIONECTOMY WITH HAMMERTOE RECONSTRUCTION Left 05/09/2014   Procedure: LEFT MODIFIED MCBRIDE BUNIONECTOMY; LEFT SECOND HAMMER TOE CORRECTION;  Surgeon: Wylene Simmer, MD;  Location: [place];  Service: Orthopedics;  Laterality: Left;  . CERVICAL SPINE SURGERY  2009  . CHOLECYSTECTOMY  2004   lap choli  . COLONOSCOPY  07/08/2003   RMR: Left-sided diverticula, remainder of colonic mucosa and terminal ileum appeared normal.. Minimally friable internal hemorrhoids, otherwise normal rectum  . COLONOSCOPY N/A 04/30/2013   Dr. Oneida Alar: mild diverticulosis in descending and sigmoid colon, internal hemorrhoids.   [name] DILATION AND CURETTAGE OF UTERUS    . NM MYOCAR PERF WALL MOTION  10/2011   bruce myoview - breast attenuation noted in anterior region; EF 65%; no ischemia/infarct/scar; low risk  . TONSILLECTOMY  1980  . TUBAL LIGATION      Social History   Socioeconomic History  . Marital status: Married    Spouse name: Mikeal Hawthorne  . Number of children: 1  . Years of education: 90  . Highest education level: Not on file  Occupational History  . Occupation: Unemployed    Employer: [employer]    Employer: UNEMPLOYED  Social Needs  . Financial resource strain: Not on file  . Food insecurity:    Worry: Not on file    Inability: Not on file  . Transportation needs:    Medical: Not on file    Non-medical: Not on file  Tobacco Use  . Smoking status: Former Smoker    Packs/day: 0.50    Years: 40.00    Pack years: 20.00    Types: Cigarettes    Last attempt to quit: 12/24/2013    Years since quitting: 4.4  . Smokeless tobacco: Never Used  . Tobacco comment: using vape as aid to sustain from smoking  Substance and Sexual Activity  . Alcohol use: No  . Drug use: No  . Sexual activity: Not on file  Lifestyle  . Physical activity:    Days per week: Not on file    Minutes per session: Not on file  . Stress: Not on file    Relationships  . Social connections:    Talks on phone: Not on file    Gets together: Not on file    Attends religious service: Not on file    Active member of club or organization: Not on file    Attends meetings of clubs or organizations: Not on file    Relationship status: Not on file  . Intimate partner violence:    Fear of current or ex partner: Not on file    Emotionally abused: Not on file    Physically abused: Not on file    Forced sexual activity: Not on file  Other Topics Concern  . Not  on file  Social History Narrative   One son, Down syndrome,is right handed ,resides with son and husband     Vitals:   05/18/18 1046  BP: 116/72  Pulse: 89  SpO2: 96%  Weight: 176 lb (79.8 kg)  Height: 5' 7.5" (1.715 m)    Wt Readings from Last 3 Encounters:  05/18/18 176 lb (79.8 kg)  05/17/18 175 lb (79.4 kg)  03/21/18 175 lb 12.8 oz (79.7 kg)     PHYSICAL EXAM General: NAD HEENT: Normal. Neck: No JVD, no thyromegaly. Lungs: Clear to auscultation bilaterally with normal respiratory effort. CV: Regular rate and rhythm, normal S1/S2, no S3/S4, no murmur. No pretibial or periankle edema.  Chest wall tenderness noted.   Abdomen: Soft, nontender, no distention.  Neurologic: Alert and oriented.  Psych: Normal affect. Skin: Normal. Musculoskeletal: No gross deformities.    ECG: Reviewed above under Subjective   Labs: Lab Results  Component Value Date/Time   K 4.8 02/10/2018 10:15 AM   BUN 14 02/10/2018 10:15 AM   CREATININE 0.87 02/10/2018 10:15 AM   ALT 18 02/10/2018 10:15 AM   TSH 1.337 03/30/2017 03:51 PM   TSH 1.382 12/06/2008 12:00 AM   HGB 14.6 02/10/2018 10:15 AM     Lipids: Lab Results  Component Value Date/Time   LDLCALC 87 06/11/2009 11:47 PM   CHOL 195 06/11/2009 11:47 PM   TRIG 238 (H) 06/11/2009 11:47 PM   HDL 60 06/11/2009 11:47 PM       ASSESSMENT AND PLAN:  1.  Tachycardia and palpitations: Sinus tachycardia appears to be provoked by  both PTSD regarding her son as well as neck pain.  She is on diltiazem 60 mg twice daily.  I recommended water aerobics to help lose weight and to decrease pain from diffuse neck and chest wall arthritis.  2.  Chest pain: She underwent a low risk nuclear stress test on 04/29/2017, LVEF 61%.  There was a very small apical defect that appeared to be's most consistent with variable soft tissue attenuation.  Continue diltiazem for tachycardia and palpitations.  Again, I recommended water aerobics to help lose weight and to decrease pain from chest wall arthritis.   Disposition: Follow up 10 weeks as per her request   Kate Sable, M.D., F.A.C.C.

## 2018-05-18 NOTE — Patient Instructions (Addendum)
Your physician wants you to follow-up in: 10 weeks with [facility]    Your physician recommends that you continue on your current medications as directed. Please refer to the Current Medication list given to you today.   If you need a refill on your cardiac medications before your next appointment, please call your pharmacy.    No lab work or tests ordered today.    Please go to water aerobics at the local [facility]      Thank you for choosing [facility] !

## 2018-05-18 NOTE — Addendum Note (Signed)
Addended by: Kate Sable A on: 05/18/2018 11:45 AM   Modules accepted: Level of Service

## 2018-05-28 IMAGING — RF DG FLUORO GUIDE NDL PLC/BX
1 series · 1 of 1 positions shown · non-contrast
Comparison: none

CLINICAL DATA: LEFT hip pain, therapeutic hip injection

[Series 1: cp_standard · 0.19mm/px · 1 of 1 slices shown]
[im 1/1]
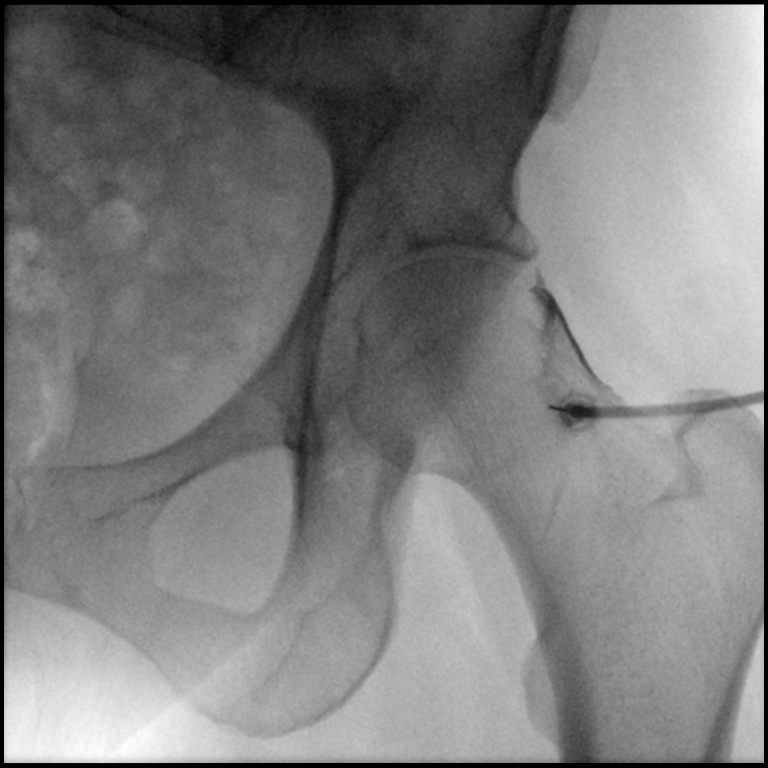

[1 of 1 positions shown; findings below may reference images not displayed]

EXAM:
LEFT HIP INJECTION UNDER FLUOROSCOPY

FLUOROSCOPY TIME:  Fluoroscopy Time:  0 minutes 6 seconds

Radiation Exposure Index (if provided by the fluoroscopic device):
7.7 mGy

Number of Acquired Spot Images: Single screen capture

PROCEDURE:
Overlying skin prepped with Betadine, draped in the usual sterile
fashion, and infiltrated locally with buffered Lidocaine. 22 gauge
spinal needle advanced to the superolateral margin of the LEFT
femoral head. 1 ml of Isovue 300 injected easily. Diagnostic
injection of iodinated contrast demonstrates intra-articular spread
without intravascular component.

80mg Depo-Medrol and lidocaine 2% were then administered, with a
total administered injection volume = 5 mL. No immediate
complication.

Patient reports complete symptomatic relief of her LEFT hip pain
following injection.
IMPRESSION: Technically successful LEFT hip injection under fluoroscopy.

## 2018-05-30 ENCOUNTER — Encounter: Payer: Self-pay | Admitting: Neurology

## 2018-05-30 ENCOUNTER — Ambulatory Visit: Payer: Medicare Other | Admitting: Neurology

## 2018-05-30 ENCOUNTER — Telehealth: Payer: Self-pay | Admitting: Neurology

## 2018-05-30 VITALS — BP 130/80 | HR 84 | Ht 68.0 in | Wt 180.0 lb

## 2018-05-30 DIAGNOSIS — G514 Facial myokymia: Secondary | ICD-10-CM

## 2018-05-30 NOTE — Progress Notes (Signed)
Subjective:    Patient ID: Natasha Wright is a 57 y.o. female.  HPI     Star Age, MD, PhD [facility] [address] [place], [place] [address]  Dear Dr. Carloyn Manner,  I saw your patient, Natasha Wright, upon your kind request in my neurologic clinic today for initial consultation of her facial twitching. The patient is unaccompanied today. As you know, [name], is a 57 year old right-handed woman with an underlying medical history of lumbar disc disease, cervical spinal stenosis, osteoarthritis, irritable bowel syndrome, reflux disease, B12 deficiency, arthritis, anxiety and overweight state, who reports twitching around the left eye and left side of the mouth with numbness on the left side of the mouth for the past few months, at least 5 months. Started gradually, mild improvement with time, but does happen daily. She has noticed a correlation with stress level and difficulty sleeping. She has a long-standing history of sleep onset insomnia, she admits to having increased anxiety but denies depression. She uses Valium infrequently and sparingly. She reports stress concerning the health of her son who has Down syndrome and had to be in the hospital a year and a half ago including at least 2 weeks in the ICU. Her mom has dementia and is an assisted living, she is her guardian. Her father is in his late 58s and lives alone, parents are divorced. She developed palpitations and started seeing cardiology. She is currently on Cardizem. She was advised by her cardiologist to pursue stress management. She is doing tai chi and tries to walk but is limited by her arthritis pain. She gets injections regularly into her neck but has had injections into her hips and ankles as well.  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 I reviewed your office note from  03/23/2018, which you kindly included.  She had a recent brain MRI without contrast on 03/19/2018 and I reviewed the results: IMPRESSION: Negative MRI of the brain.  She is married and lives with her husband and her son. She has 1 child. She quit smoking in 2010. She drinks caffeine in the form of coffee, one cup per day on average.  Previously:   07/12/2014: Natasha Wright is a 57 year old right-handed woman with an underlying medical history of allergic rhinitis, IBS, arthritis, chronic low back pain and neck pain, fibromyalgia, migraines, hypoglycemia, macular degeneration, chest pain, carpal tunnel syndrome, and chronic pain, who presents for followup consultation after her recent baseline sleep study. I first met her on 03/13/2014 at the request of her primary care physician at which time she reported waking up with a gasping sensation and she had had an overnight pulse oximetry on 02/11/2014 which showed abnormal readings. She had recently seen pulmonology as well as cardiology and was diagnosed with reflux disease but no COPD and no ischemic heart disease. She reported pain at night and multiple nighttime awakenings. She was recently diagnosed with B12 deficiency and started on B12 injections. I suggested she return for sleep study. She had a baseline sleep study on 04/18/2014 and I went over her sleep test results with her in detail today. Her sleep efficiency was mildly reduced at 81.2% with a latency to sleep 23 minutes and wake after sleep onset of 24.5 minutes. She is in normal arousal index. She had a markedly increased percentage of stage II sleep at 89.1%, absence  of deep sleep and a markedly decreased percentage of REM sleep at 5.6% with a prolonged REM latency of 254.5 minutes. She had no significant periodic leg movements or EKG changes. She had bruxism. No significant snoring was noted. She slept mostly in the supine position. Total AHI was 1.5 per hour. Baseline oxygen saturation was 95%  with a nadir of 91%. I felt that the increase in fast activity in the beta range and the increase in stage II sleep was primarily secondary to medication effect from benzodiazepine. In the interim on 05/09/2014 she had left knee and and hammertoe surgery. Today, she reports having residual L foot pain. She has hardware and had multiple stiches, and is still in pain. She has been seeing Dr. Ace Gins for her pain. She sometimes naps after lunch for about an hour. She takes valium for muscle spasms per Dr. Ace Gins. She lives with her husband her son, 62.    Her Past Medical History Is Significant For: Past Medical History:  Diagnosis Date  . Anxiety   . Arthritis   . B12 deficiency    h/o  . Family history of heart disease   . GERD (gastroesophageal reflux disease)   . Hypoglycemia   . IBS (irritable bowel syndrome)   . Lumbar disc disease   . OA (osteoarthritis)   . PONV (postoperative nausea and vomiting)    needs scop patch  . Rectocele   . Shingles   . Spinal stenosis in cervical region     Her Past Surgical History Is Significant For: Past Surgical History:  Procedure Laterality Date  . ABDOMINAL HYSTERECTOMY  1992  . ARTHRODESIS FOOT WITH WEIL OSTEOTOMY Left 05/09/2014   Procedure: LEFT FIRST TARSAL METATARSAL  ARTHRODESIS; LEFT SECOND WEIL OSTEOTOMY;  Surgeon: Wylene Simmer, MD;  Location: [facility];  Service: Orthopedics;  Laterality: Left;  . BUNIONECTOMY WITH HAMMERTOE RECONSTRUCTION Left 05/09/2014   Procedure: LEFT MODIFIED MCBRIDE BUNIONECTOMY; LEFT SECOND HAMMER TOE CORRECTION;  Surgeon: Wylene Simmer, MD;  Location: [facility];  Service: Orthopedics;  Laterality: Left;  . CERVICAL SPINE SURGERY  2009  . CHOLECYSTECTOMY  2004   lap choli  . COLONOSCOPY  07/08/2003   RMR: Left-sided diverticula, remainder of colonic mucosa and terminal ileum appeared normal.. Minimally friable internal hemorrhoids, otherwise normal rectum  . COLONOSCOPY N/A  04/30/2013   Dr. Oneida Alar: mild diverticulosis in descending and sigmoid colon, internal hemorrhoids.   [name] DILATION AND CURETTAGE OF UTERUS    . NM MYOCAR PERF WALL MOTION  10/2011   bruce myoview - breast attenuation noted in anterior region; EF 65%; no ischemia/infarct/scar; low risk  . TONSILLECTOMY  1980  . TUBAL LIGATION      Her Family History Is Significant For: Family History  Problem Relation Age of Onset  . Diabetes Mother   . Hypertension Mother   . Hyperlipidemia Mother   . Ulcerative colitis Father   . Breast cancer Maternal Grandmother   . Hypertension Maternal Grandmother   . Diabetes Maternal Grandmother   . Stroke Maternal Grandmother   . Asthma Maternal Grandmother   . Stroke Maternal Grandfather   . Stroke Paternal Grandmother   . Diabetes Paternal Grandmother   . Heart disease Paternal Grandmother   . Heart disease Paternal Grandfather   . Hyperlipidemia Brother   . Hyperlipidemia Sister   . Hypertension Sister   . Diabetes Sister   . Down syndrome Child   . Heart disease Child   . Colon cancer  Neg Hx     Her Social History Is Significant For: Social History   Socioeconomic History  . Marital status: Married    Spouse name: Mikeal Hawthorne  . Number of children: 1  . Years of education: 55  . Highest education level: Not on file  Occupational History  . Occupation: Unemployed    Employer: [employer]    Employer: UNEMPLOYED  Social Needs  . Financial resource strain: Not on file  . Food insecurity:    Worry: Not on file    Inability: Not on file  . Transportation needs:    Medical: Not on file    Non-medical: Not on file  Tobacco Use  . Smoking status: Former Smoker    Packs/day: 0.50    Years: 40.00    Pack years: 20.00    Types: Cigarettes    Last attempt to quit: 12/24/2013    Years since quitting: 4.4  . Smokeless tobacco: Never Used  . Tobacco comment: using vape as aid to sustain from smoking  Substance and Sexual Activity  .  Alcohol use: No  . Drug use: No  . Sexual activity: Not on file  Lifestyle  . Physical activity:    Days per week: Not on file    Minutes per session: Not on file  . Stress: Not on file  Relationships  . Social connections:    Talks on phone: Not on file    Gets together: Not on file    Attends religious service: Not on file    Active member of club or organization: Not on file    Attends meetings of clubs or organizations: Not on file    Relationship status: Not on file  Other Topics Concern  . Not on file  Social History Narrative   One son, Down syndrome,is right handed ,resides with son and husband    Her Allergies Are:  Allergies  Allergen Reactions  . Penicillins Shortness Of Breath and Rash    Has patient had a PCN reaction causing immediate rash, facial/tongue/throat swelling, SOB or lightheadedness with hypotension: Yes Has patient had a PCN reaction causing severe rash involving mucus membranes or skin necrosis: No Has patient had a PCN reaction that required hospitalization Yes Has patient had a PCN reaction occurring within the last 10 years: No If all of the above answers are "NO", then may proceed with Cephalosporin use.   . Codeine     REACTION: gi upset, skin crawling  . Fentanyl     REACTION: skin crawling, nausea, passed out  . Morphine     REACTION: GI upset, skin crawling  . Nsaids   . Sulfamethoxazole-Trimethoprim     REACTION: rash, sob  . Influenza Vaccines Rash  :   Her Current Medications Are:  Outpatient Encounter Medications as of 05/30/2018  Medication Sig  . diazepam (VALIUM) 10 MG tablet Take 10 mg by mouth every 12 (twelve) hours as needed (back pain).   [name] diltiazem (CARDIZEM) 60 MG tablet Take 1 tablet (60 mg total) by mouth 2 (two) times daily.  [name] estradiol (ESTRACE) 0.5 MG tablet Take 0.5 mg by mouth daily.  . famotidine (PEPCID) 20 MG tablet One at bedtime (Patient taking differently: Take 20 mg by mouth at bedtime. One at bedtime)   . fluticasone (FLONASE) 50 MCG/ACT nasal spray Place 1 spray into both nostrils 2 (two) times daily.  . Multiple Vitamin (MULTIVITAMIN WITH MINERALS) TABS tablet Take 1 tablet by mouth daily.  . [  DISCONTINUED] mupirocin ointment (BACTROBAN) 2 % Place 1 application into the nose 2 (two) times daily.  . [DISCONTINUED] pantoprazole (PROTONIX) 40 MG tablet Take 1 tablet (40 mg total) by mouth daily. 30 minutes before breakfast   No facility-administered encounter medications on file as of 05/30/2018.   :   Review of Systems:  Out of a complete 14 point review of systems, all are reviewed and negative with the exception of these symptoms as listed below:  Review of Systems  Neurological:       Pt presents today to discuss her left sided facial twitching. She notices her eyelids twitching. Pt also reports drooling out of the left side of her mouth.    Objective:  Neurological Exam  Physical Exam Physical Examination:   Vitals:   05/30/18 0949  BP: 130/80  Pulse: 84   General Examination: The patient is a very pleasant 57 y.o. female in no acute distress. She appears well-developed and well-nourished and very well groomed. Mildly anxious appearing.  HEENT: Normocephalic, atraumatic, pupils are equal, round and reactive to light and accommodation. Extraocular tracking is good without limitation to gaze excursion or nystagmus noted. Normal smooth pursuit is noted. Hearing is grossly intact. Face is symmetric with normal facial animation and normal facial sensation. No obvious facial twitching or blepharospasm is noted. Speech is clear with no dysarthria noted. There is no hypophonia. There is no lip, neck/head, jaw or voice tremor. Neck shows limitation in range of motion, she has hypertrophied posterior neck muscles. Airway examination is overall benign, mild mouth dryness is noted, tongue movements are normal. No abnormal involuntary movements of the mouth or face are noted.  Chest: Clear to  auscultation without wheezing, rhonchi or crackles noted.  Heart: S1+S2+0, regular and normal without murmurs, rubs or gallops noted.   Abdomen: Soft, non-tender and non-distended with normal bowel sounds appreciated on auscultation.  Extremities: There is no pitting edema in the distal lower extremities bilaterally. Pedal pulses are intact.  Skin: Warm and dry without trophic changes noted.  Musculoskeletal: exam reveals no obvious joint deformities, tenderness or joint swelling or erythema.   Neurologically:  Mental status: The patient is awake, alert and oriented in all 4 spheres. Her immediate and remote memory, attention, language skills and fund of knowledge are appropriate. There is no evidence of aphasia, agnosia, apraxia or anomia. Speech is clear with normal prosody and enunciation. Thought process is linear. Mood is normal and affect is normal.  Cranial nerves II - XII are as described above under HEENT exam. In addition: shoulder shrug is normal with equal shoulder height noted. Motor exam: Normal bulk, strength and tone is noted. There is no drift, tremor or rebound. Romberg is negative. Reflexes are 1+ throughout. Fine motor skills and coordination: intact with normal finger taps, normal hand movements, normal rapid alternating patting, normal foot taps and normal foot agility.  Cerebellar testing: No dysmetria or intention tremor on finger to nose testing. Heel to shin is difficult.  Sensory exam: intact to light touch, vibration, temperature sense in the upper and lower extremities.  Gait, station and balance: She stands slowly and with mild difficulty, posture is fairly normal, slight increase in neck kyphosis is noted. She walks without difficulty, tandem walk is difficult for her.   Assessment and Plan:    In summary, Evamaria Detore is a very pleasant 57 y.o.-year old female with an underlying medical history of lumbar disc disease, cervical spinal stenosis, osteoarthritis,  irritable bowel syndrome, reflux disease,  B12 deficiency, arthritis, anxiety and overweight state, who presents for initial consultation of her left sided facial twitching around her eyes and left side of the mouth. Her exam is benign in that regard at this time. Differential diagnosis primarily includes benign fasciculations versus hemifacial spasm, but there is currently on examination no evidence of hemifacial spasm. She is reassured for the most part. She does have quite a few stressors in her life and is strongly encouraged to work on stress reduction, and consider anxiety management with the help of her primary care physician, perhaps a small dose of an antidepressant. We will continue to monitor her symptoms. I would like to proceed with an MRI brain with and without contrast for completion and to rule out a structural cause of her symptoms. We talked about the importance of stress management, healthy lifestyle, continuing physical activity within her limitation, good hydration, good sleep habits at length today.  We will call her with her brain MRI results. I plan to see her back in about 6 months for a recheck on her symptoms and exam. I answered all her questions today and she was in agreement.  Thank you very much for allowing me to participate in the care of this nice patient. If I can be of any further assistance to you please do not hesitate to call me at [phone].  Sincerely,   Star Age, MD, PhD

## 2018-05-30 NOTE — Patient Instructions (Addendum)
Your facial twitching may be due to stress, sleep deprivation, dehydration. Rarely, the cause is what's called hemifacial spasm.  For your higher anxiety level, please talk to Dr. Gerarda Fraction about stress and anxiety management, maybe a small dose of an antidepressant.  We will recheck things in about 6 months.  We will do a brain scan, called MRI with and without contrast, and call you with the test results. We will have to schedule you for this on a separate date. This test requires authorization from your insurance, and we will take care of the insurance process.

## 2018-05-30 NOTE — Telephone Encounter (Signed)
[employer] order sent to GI. No auth they will reach out to the pt to schedule.  °

## 2018-06-01 ENCOUNTER — Other Ambulatory Visit: Payer: Self-pay

## 2018-06-01 ENCOUNTER — Ambulatory Visit (INDEPENDENT_AMBULATORY_CARE_PROVIDER_SITE_OTHER): Payer: Medicare Other | Admitting: Otolaryngology

## 2018-06-01 DIAGNOSIS — R0982 Postnasal drip: Secondary | ICD-10-CM | POA: Diagnosis not present

## 2018-06-01 DIAGNOSIS — J310 Chronic rhinitis: Secondary | ICD-10-CM | POA: Diagnosis not present

## 2018-06-01 MED ORDER — DILTIAZEM HCL 60 MG PO TABS: 60.0000 mg | ORAL_TABLET | Freq: Two times a day (BID) | ORAL | 3 refills | Status: DC

## 2018-06-08 ENCOUNTER — Ambulatory Visit: Payer: Medicare Other | Admitting: Gastroenterology

## 2018-06-08 ENCOUNTER — Other Ambulatory Visit: Payer: Self-pay | Admitting: *Deleted

## 2018-06-08 ENCOUNTER — Encounter: Payer: Self-pay | Admitting: *Deleted

## 2018-06-08 ENCOUNTER — Encounter: Payer: Self-pay | Admitting: Gastroenterology

## 2018-06-08 ENCOUNTER — Telehealth: Payer: Self-pay | Admitting: *Deleted

## 2018-06-08 DIAGNOSIS — R1013 Epigastric pain: Secondary | ICD-10-CM | POA: Diagnosis not present

## 2018-06-08 DIAGNOSIS — R140 Abdominal distension (gaseous): Secondary | ICD-10-CM

## 2018-06-08 DIAGNOSIS — R1310 Dysphagia, unspecified: Secondary | ICD-10-CM

## 2018-06-08 MED ORDER — PANTOPRAZOLE SODIUM 40 MG PO TBEC
DELAYED_RELEASE_TABLET | ORAL | 11 refills | Status: DC
Start: 2018-06-08 — End: 2019-03-05

## 2018-06-08 NOTE — Patient Instructions (Addendum)
DRINK WATER TO KEEP YOUR URINE LIGHT YELLOW.  AVOID REFLUX TRIGGERS. SEE INFO BELOW.  AVOID ITEMS THAT CAUSE BLOATING & GAS. SEE INFO BELOW.  START PROTONIX. TAKE 30 MINUTES PRIOR TO MEALS TWICE DAILY.  USE PEPCID IF NEEDED TO CONTROL HEARTBURN/INDIGESTION.  CHEW ONE TUMS AFTER A MEAL THREE TIMES A DAY TO PREVENT BONE LOSS AND TO AVOID CONSTIPATION.  CONSIDER LOW DOSE CELEXA, PROZAC, ZOLOFT, OR PAMELOR.   SCHEDULE UPPER ENDOSCOPY TO POSSIBLY STRETCH YOUR ESOPHAGUS IN 2-3 WEEKS  FOLLOW UP IN 4 MOS.        Lifestyle and home remedies TO MANAGE REFLUX/CHEST PAIN  You may eliminate or reduce the frequency of heartburn by making the following lifestyle changes:  . Control your weight. Being overweight is a major risk factor for heartburn and GERD. Excess pounds put pressure on your abdomen, pushing up your stomach and causing acid to back up into your esophagus.   . Eat smaller meals. 4 TO 6 MEALS A DAY. This reduces pressure on the lower esophageal sphincter, helping to prevent the valve from opening and acid from washing back into your esophagus.   Dolphus Jenny your belt. Clothes that fit tightly around your waist put pressure on your abdomen and the lower esophageal sphincter.   . Eliminate heartburn triggers. Everyone has specific triggers. Common triggers such as fatty or fried foods, spicy food, tomato sauce, carbonated beverages, alcohol, chocolate, mint, garlic, onion, caffeine and nicotine may make heartburn worse.   [name] Avoid stooping or bending. Tying your shoes is OK. Bending over for longer periods to weed your garden isn't, especially soon after eating.   . Don't lie down after a meal. Wait at least three to four hours after eating before going to bed, and don't lie down right after eating.   [name] PUT THE HEAD OF YOUR BED ON 6 INCH BLOCKS.   Alternative medicine . Several home remedies exist for treating GERD, but they provide only temporary relief. They include drinking  baking soda (sodium bicarbonate) added to water or drinking other fluids such as baking soda mixed with cream of tartar and water.  . Although these liquids create temporary relief by neutralizing, washing away or buffering acids, eventually they aggravate the situation by adding gas and fluid to your stomach, increasing pressure and causing more acid reflux. Further, adding more sodium to your diet may increase your blood pressure and add stress to your heart, and excessive bicarbonate ingestion can alter the acid-base balance in your body.    BLOATING AND GAS PREVENTION  Although gas may be uncomfortable and embarrassing, it is not life-threatening. Understanding causes, ways to reduce symptoms, and treatment will help most people find some relief.  Points to remember . Everyone has gas in the digestive tract. [name] People often believe normal passage of gas to be excessive. . Gas comes from two main sources: swallowed air and normal breakdown of certain foods by harmless bacteria naturally present in the large intestine. . Many foods with carbohydrates can cause gas. Fats and proteins cause little gas.  . Foods that may cause gas include o beans  o vegetables, such as broccoli, cabbage, brussels sprouts, onions, artichokes, and asparagus  o fruits, such as pears, apples, and peaches  o whole grains, such as whole wheat and bran  o soft drinks and fruit drinks  o milk and milk products, such as cheese and ice cream, and packaged foods prepared with lactose, such as bread, cereal, and salad dressing  o foods containing sorbitol, such as dietetic foods and sugar free candies and gums. o  . The most common symptoms of gas are belching, flatulence, bloating, and abdominal pain. However, some of these symptoms are often caused by an intestinal disorder, such as irritable bowel syndrome, rather than too much gas. .  . The most common ways to reduce the discomfort of gas are changing diet, taking  nonprescription medicines, and reducing the amount of air swallowed. .  . Digestive enzymes, such as lactase supplements, actually help digest carbohydrates and may allow people to eat foods that normally cause gas.

## 2018-06-08 NOTE — Progress Notes (Signed)
cc'ed to pcp °

## 2018-06-08 NOTE — Assessment & Plan Note (Signed)
LIKELY DUE TO DIETARY CHOICES, DOUBT SMALL INTESTINE BACTERIAL OVERGROWTH.   DRINK WATER TO KEEP YOUR URINE LIGHT YELLOW. AVOID ITEMS THAT CAUSE BLOATING & GAS.  HANDOUT GIVEN. AVOID DAIRY. CHEW ONE TUMS AFTER A MEAL THREE TIMES A DAY TO PREVENT BONE LOSS AND TO AVOID CONSTIPATION. CONSIDER TRIAL CIP/FLAGYL FOR SIBO.  FOLLOW UP IN 4 MOS.

## 2018-06-08 NOTE — Progress Notes (Signed)
Subjective:    Patient ID: Natasha Wright, female    DOB: 02-03-1961, 56 y.o.   MRN: [id number]  [employer], MD  HPI ANGRY STOMACH TOP AND BOTTOM. INDIGESTION( TOP OF THROAT, FEEL LIKE SOMETHING SUCK,BURNING, HAPPENS EVERY DAY FOR MINS TO HOURS, TROUBLE WITH SWALLOWING LIQUIDS/SOLIDS)) MORE THAN USUAL. PAIN BLOATING/GAS JUST ABOUT EVERY DAY. NOT ALWAYS PRESENT. HAS MORE LEFT LOWER SIDE. WON'T GO UP OR DOWN AND WON'T COME OUT. MAY TAKE IT TWICE A DAY. PEANUTS KILLS HER. GAS, RUMBLING, AN DIBS. OCCASIONAL CONSTIPATION(SKIP A DAY AND WHEN SHE GOES IT'S VERY HARD AND MAY SEE BLOOD). HAS HEMORRHOIDS.  HAS A RECTOCELE THAT THEY DON'T WANT TO FIX BY SURGERY. THINKS IT'S CAUSING HER A LOT OF PROBLEMS. PAIN LIKE SHE NEEDS TO PASS A BOWLING BALL. SAW ENT([facility]) AND ?LPR THAT IMPROVED WITH NASAL SPRAYS  TROUBLE SLEEPING DUE TO C SPINE PAIN AND SCOLIOSIS. GETS UP IN MIDDLE OF NIGHT DUE TO DOG, NECK PAN, AND BELLY. BMs: NORMAL AND MAY HAVE A LOT OF DIARRHEA AFTER LUNCH. MAY EAT YOGURT. CAN'T TAKE VITAMIN D AND CALCIUM. CAN GET FROM [place] AND [place]. TRYING TO LOSE WEIGHT. DOING TAI [facility]. HAVING RAPID HEARTBEAT DUE TO STRESS OF TAKING CARE OF PEOPLE(SON HAS DOWN'S SYNDROME AND SHE'S ALMOST LOST HIM 3-4 TIMES. MOTHER WITH ALZHEIMER'S AND SHE'S POA). CHEST PAIN DUE TO ARTHRITIS AND RACING HEART.    WAKES UP SICK AFTER SURGERIES AND DID WAKE UP DURING LAST COLONOSCOPY.  PT DENIES FEVER, CHILLS, HEMATEMESIS, nausea, vomiting, melena, SHORTNESS OF BREATH, OR CHANGE IN BOWEL IN HABITS. QUIT SMOKING 5-6 YRS AGO.  Past Medical History:  Diagnosis Date  . Anxiety   . Arthritis   . B12 deficiency    h/o  . Family history of heart disease   . GERD (gastroesophageal reflux disease)   . Hypoglycemia   . IBS (irritable bowel syndrome)   . Lumbar disc disease   . OA (osteoarthritis)   . PONV (postoperative nausea and vomiting)    needs scop patch  . Rectocele   . Shingles   . Spinal stenosis in  cervical region     Past Surgical History:  Procedure Laterality Date  . ABDOMINAL HYSTERECTOMY  1992  . ARTHRODESIS FOOT WITH WEIL OSTEOTOMY Left 05/09/2014   Procedure: LEFT FIRST TARSAL METATARSAL  ARTHRODESIS; LEFT SECOND WEIL OSTEOTOMY;  Surgeon: Wylene Simmer, MD;  Location: [place];  Service: Orthopedics;  Laterality: Left;  . BUNIONECTOMY WITH HAMMERTOE RECONSTRUCTION Left 05/09/2014   Procedure: LEFT MODIFIED MCBRIDE BUNIONECTOMY; LEFT SECOND HAMMER TOE CORRECTION;  Surgeon: Wylene Simmer, MD;  Location: [place];  Service: Orthopedics;  Laterality: Left;  . CERVICAL SPINE SURGERY  2009  . CHOLECYSTECTOMY  2004   lap choli  . COLONOSCOPY  07/08/2003   RMR: Left-sided diverticula, remainder of colonic mucosa and terminal ileum appeared normal.. Minimally friable internal hemorrhoids, otherwise normal rectum  . COLONOSCOPY N/A 04/30/2013   Dr. Oneida Alar: mild diverticulosis in descending and sigmoid colon, internal hemorrhoids.   [name] DILATION AND CURETTAGE OF UTERUS    . NM MYOCAR PERF WALL MOTION  10/2011   bruce myoview - breast attenuation noted in anterior region; EF 65%; no ischemia/infarct/scar; low risk  . TONSILLECTOMY  1980  . TUBAL LIGATION     Allergies  Allergen Reactions  . Penicillins Shortness Of Breath and Rash      . Codeine     REACTION: gi upset, skin crawling  . Fentanyl  REACTION: skin crawling, nausea, passed out  . Morphine     REACTION: GI upset, skin crawling  . Nsaids   . Sulfamethoxazole-Trimethoprim     REACTION: rash, sob  . Influenza Vaccines Rash    Current Outpatient Medications  Medication Sig    . diazepam (VALIUM) 10 MG tablet Take 10 mg by mouth every 12 (twelve) hours as needed (back pain).     [name] diltiazem (CARDIZEM) 60 MG tablet Take 1 tablet (60 mg total) by mouth 2 (two) times daily.    [name] estradiol (ESTRACE) 0.5 MG tablet Take 0.5 mg by mouth daily.    . famotidine (PEPCID) 20 MG tablet One at bedtime  (Patient taking differently: Take 20 mg by mouth at bedtime. One at bedtime)    . fluticasone (FLONASE) 50 MCG/ACT nasal spray Place 1 spray into both nostrils 2 (two) times daily.    [name] ipratropium (ATROVENT) 0.06 % nasal spray Place 2 sprays into both nostrils daily.    . Multiple Vitamin (MULTIVITAMIN WITH MINERALS) TABS tablet Take 1 tablet by mouth daily.     Review of Systems PER HPI OTHERWISE ALL SYSTEMS ARE NEGATIVE.    Objective:   Physical Exam  Constitutional: She is oriented to person, place, and time. She appears well-developed and well-nourished. No distress.  HENT:  Head: Normocephalic and atraumatic.  Mouth/Throat: Oropharynx is clear and moist. No oropharyngeal exudate.  Eyes: Pupils are equal, round, and reactive to light. No scleral icterus.  Neck: Normal range of motion. Neck supple.  Cardiovascular: Normal rate, regular rhythm and normal heart sounds.  Pulmonary/Chest: Effort normal and breath sounds normal. No respiratory distress.  Abdominal: Soft. Bowel sounds are normal. She exhibits no distension. There is tenderness. There is no rebound and no guarding.  MILD TTP IN THE LLQ.  Musculoskeletal: She exhibits no edema.  Lymphadenopathy:    She has no cervical adenopathy.  Neurological: She is alert and oriented to person, place, and time.  NO NEW FOCAL DEFICITS. HAS RESTLESS LEGS.  Psychiatric:  ANXIOUS MOOD, NORMAL AFFECT  Vitals reviewed.     Assessment & Plan:

## 2018-06-08 NOTE — Assessment & Plan Note (Addendum)
SYMPTOMS NOT IDEALLY CONTROLLED. DIFFERENTIAL DIAGNOSIS INCLUDES: H PYLORI GASTRITIS, UNCONTROLLED GERD, IBS-MIXED EXACERBATED BY DIETARY CHOICES, LESS LIKELY GE JUNCTION TUMOR, GASTRIC OR PANCREATIC CA OR CHRONIC MESENTERIC ISCHEMIA. LAST CT APR 2019: NAIAP.  DRINK WATER TO KEEP YOUR URINE LIGHT YELLOW. AVOID REFLUX TRIGGERS.  HANDOUT GIVEN. AVOID ITEMS THAT CAUSE BLOATING & GAS.  HANDOUT GIVEN. START PROTONIX. TAKE 30 MINUTES PRIOR TO MEALS TWICE DAILY. USE PEPCID IF NEEDED TO CONTROL HEARTBURN/INDIGESTION. CONSIDER LOW DOSE CELEXA, PROZAC, ZOLOFT, OR PAMELOR TO MANAGE ANXIETY AND TO BETTER CONTROL FIBROMYALGIA, CHRONIC NECK PAIN, IBS, AND POSSIBLE NON-ULCER DYSPEPSIA. UPPER ENDOSCOPY W/ MAC(FAILED CONSCIOUS SEDATION) TO POSSIBLY STRETCH YOUR ESOPHAGUS IN 2-3 WEEKS. NEEDS GASTRIC AND DUODENAL BIOPSIES. DISCUSSED PROCEDURE, BENEFITS, & RISKS: < 1% chance of medication reaction, bleeding, OR perforation. NO INDICATION FOR COLONOSCOPY AT THIS TIME.   FOLLOW UP IN 4 MOS.   GREATER THAN 50% WAS SPENT IN COUNSELING & COORDINATION OF CARE WITH THE PATIENT: DISCUSSED DIFFERENTIAL DIAGNOSIS, PROCEDURE, BENEFITS, RISKS, AND MANAGEMENT OF DYSPEPSIA/DYSPHAGIA. TOTAL ENCOUNTER TIME: 40 MINS.

## 2018-06-08 NOTE — Telephone Encounter (Signed)
Pre-op scheduled for 07/06/18 at 10:00am. Letter mailed. LMOVM

## 2018-06-09 ENCOUNTER — Other Ambulatory Visit: Payer: Self-pay

## 2018-06-09 MED ORDER — DILTIAZEM HCL 60 MG PO TABS: 60.0000 mg | ORAL_TABLET | Freq: Two times a day (BID) | ORAL | 3 refills | Status: DC

## 2018-06-09 NOTE — Telephone Encounter (Signed)
Refilled cardizem per fax request

## 2018-06-13 ENCOUNTER — Other Ambulatory Visit: Payer: Self-pay | Admitting: *Deleted

## 2018-06-13 MED ORDER — DILTIAZEM HCL 60 MG PO TABS: 60.0000 mg | ORAL_TABLET | Freq: Two times a day (BID) | ORAL | 3 refills | Status: DC

## 2018-06-14 DIAGNOSIS — M1991 Primary osteoarthritis, unspecified site: Secondary | ICD-10-CM | POA: Diagnosis not present

## 2018-06-14 DIAGNOSIS — L0202 Furuncle of face: Secondary | ICD-10-CM | POA: Diagnosis not present

## 2018-06-14 DIAGNOSIS — Z0001 Encounter for general adult medical examination with abnormal findings: Secondary | ICD-10-CM | POA: Diagnosis not present

## 2018-06-15 DIAGNOSIS — E782 Mixed hyperlipidemia: Secondary | ICD-10-CM | POA: Diagnosis not present

## 2018-06-15 DIAGNOSIS — Z79899 Other long term (current) drug therapy: Secondary | ICD-10-CM | POA: Diagnosis not present

## 2018-06-15 DIAGNOSIS — R946 Abnormal results of thyroid function studies: Secondary | ICD-10-CM | POA: Diagnosis not present

## 2018-06-15 DIAGNOSIS — E785 Hyperlipidemia, unspecified: Secondary | ICD-10-CM | POA: Diagnosis not present

## 2018-07-03 NOTE — Patient Instructions (Signed)
Natasha Wright  07/03/2018     @PREFPERIOPPHARMACY @   Your procedure is scheduled on  07/11/2018 .  Report to [facility] at  800   A.M.  Call this number if you have problems the morning of surgery:  [phone]   Remember:  Do not eat or drink after midnight.  You may drink clear liquids until  (follow the instructions given to you) .  Clear liquids allowed are:                    Water, Juice (non-citric and without pulp), Carbonated beverages, Clear Tea, Black Coffee only, Plain Jell-O only, Gatorade and Plain Popsicles only    Take these medicines the morning of surgery with A SIP OF WATER  Celexa, valium( if needed), diltiazem, protonix.    Do not wear jewelry, make-up or nail polish.  Do not wear lotions, powders, or perfumes, or deodorant.  Do not shave 48 hours prior to surgery.  Men may shave face and neck.  Do not bring valuables to the hospital.  [facility] is not responsible for any belongings or valuables.  Contacts, dentures or bridgework may not be worn into surgery.  Leave your suitcase in the car.  After surgery it may be brought to your room.  For patients admitted to the hospital, discharge time will be determined by your treatment team.  Patients discharged the day of surgery will not be allowed to drive home.   Name and phone number of your driver:   family Special instructions:  Follow the diet and prep instructions given to you by Dr Nona Dell office.  Please read over the following fact sheets that you were given. Anesthesia Post-op Instructions and Care and Recovery After Surgery       Esophagogastroduodenoscopy Esophagogastroduodenoscopy (EGD) is a procedure to examine the lining of the esophagus, stomach, and first part of the small intestine (duodenum). This procedure is done to check for problems such as inflammation, bleeding, ulcers, or growths. During this procedure, a long, flexible, lighted tube with a camera attached  (endoscope) is inserted down the throat. Tell a health care provider about:  Any allergies you have.  All medicines you are taking, including vitamins, herbs, eye drops, creams, and over-the-counter medicines.  Any problems you or family members have had with anesthetic medicines.  Any blood disorders you have.  Any surgeries you have had.  Any medical conditions you have.  Whether you are pregnant or may be pregnant. What are the risks? Generally, this is a safe procedure. However, problems may occur, including:  Infection.  Bleeding.  A tear (perforation) in the esophagus, stomach, or duodenum.  Trouble breathing.  Excessive sweating.  Spasms of the larynx.  A slowed heartbeat.  Low blood pressure.  What happens before the procedure?  Follow instructions from your health care provider about eating or drinking restrictions.  Ask your health care provider about: ? Changing or stopping your regular medicines. This is especially important if you are taking diabetes medicines or blood thinners. ? Taking medicines such as aspirin and ibuprofen. These medicines can thin your blood. Do not take these medicines before your procedure if your health care provider instructs you not to.  Plan to have someone take you home after the procedure.  If you wear dentures, be ready to remove them before the procedure. What happens during the procedure?  To reduce your risk of infection,  your health care team will wash or sanitize their hands.  An IV tube will be put in a vein in your hand or arm. You will get medicines and fluids through this tube.  You will be given one or more of the following: ? A medicine to help you relax (sedative). ? A medicine to numb the area (local anesthetic). This medicine may be sprayed into your throat. It will make you feel more comfortable and keep you from gagging or coughing during the procedure. ? A medicine for pain.  A mouth guard may be  placed in your mouth to protect your teeth and to keep you from biting on the endoscope.  You will be asked to lie on your left side.  The endoscope will be lowered down your throat into your esophagus, stomach, and duodenum.  Air will be put into the endoscope. This will help your health care provider see better.  The lining of your esophagus, stomach, and duodenum will be examined.  Your health care provider may: ? Take a tissue sample so it can be looked at in a lab (biopsy). ? Remove growths. ? Remove objects (foreign bodies) that are stuck. ? Treat any bleeding with medicines or other devices that stop tissue from bleeding. ? Widen (dilate) or stretch narrowed areas of your esophagus and stomach.  The endoscope will be taken out. The procedure may vary among health care providers and hospitals. What happens after the procedure?  Your blood pressure, heart rate, breathing rate, and blood oxygen level will be monitored often until the medicines you were given have worn off.  Do not eat or drink anything until the numbing medicine has worn off and your gag reflex has returned. This information is not intended to replace advice given to you by your health care provider. Make sure you discuss any questions you have with your health care provider. Document Released: 03/11/2005 Document Revised: 04/15/2016 Document Reviewed: 10/02/2015 Elsevier Interactive Patient Education  2018 [employer]. Esophagogastroduodenoscopy, Care After Refer to this sheet in the next few weeks. These instructions provide you with information about caring for yourself after your procedure. Your health care provider may also give you more specific instructions. Your treatment has been planned according to current medical practices, but problems sometimes occur. Call your health care provider if you have any problems or questions after your procedure. What can I expect after the procedure? After the procedure,  it is common to have:  A sore throat.  Nausea.  Bloating.  Dizziness.  Fatigue.  Follow these instructions at home:  Do not eat or drink anything until the numbing medicine (local anesthetic) has worn off and your gag reflex has returned. You will know that the local anesthetic has worn off when you can swallow comfortably.  Do not drive for 24 hours if you received a medicine to help you relax (sedative).  If your health care provider took a tissue sample for testing during the procedure, make sure to get your test results. This is your responsibility. Ask your health care provider or the department performing the test when your results will be ready.  Keep all follow-up visits as told by your health care provider. This is important. Contact a health care provider if:  You cannot stop coughing.  You are not urinating.  You are urinating less than usual. Get help right away if:  You have trouble swallowing.  You cannot eat or drink.  You have throat or chest pain that  gets worse.  You are dizzy or light-headed.  You faint.  You have nausea or vomiting.  You have chills.  You have a fever.  You have severe abdominal pain.  You have black, tarry, or bloody stools. This information is not intended to replace advice given to you by your health care provider. Make sure you discuss any questions you have with your health care provider. Document Released: 10/25/2012 Document Revised: 04/15/2016 Document Reviewed: 10/02/2015 Elsevier Interactive Patient Education  2018 [employer].  Esophageal Dilatation Esophageal dilatation is a procedure to open a blocked or narrowed part of the esophagus. The esophagus is the long tube in your throat that carries food and liquid from your mouth to your stomach. The procedure is also called esophageal dilation. You may need this procedure if you have a buildup of scar tissue in your esophagus that makes it difficult, painful, or  even impossible to swallow. This can be caused by gastroesophageal reflux disease (GERD). In rare cases, people need this procedure because they have cancer of the esophagus or a problem with the way food moves through the esophagus. Sometimes you may need to have another dilatation to enlarge the opening of the esophagus gradually. Tell a health care provider about:  Any allergies you have.  All medicines you are taking, including vitamins, herbs, eye drops, creams, and over-the-counter medicines.  Any problems you or family members have had with anesthetic medicines.  Any blood disorders you have.  Any surgeries you have had.  Any medical conditions you have.  Any antibiotic medicines you are required to take before dental procedures. What are the risks? Generally, this is a safe procedure. However, problems can occur and include:  Bleeding from a tear in the lining of the esophagus.  A hole (perforation) in the esophagus.  What happens before the procedure?  Do not eat or drink anything after midnight on the night before the procedure or as directed by your health care provider.  Ask your health care provider about changing or stopping your regular medicines. This is especially important if you are taking diabetes medicines or blood thinners.  Plan to have someone take you home after the procedure. What happens during the procedure?  You will be given a medicine that makes you relaxed and sleepy (sedative).  A medicine may be sprayed or gargled to numb the back of the throat.  Your health care provider can use various instruments to do an esophageal dilatation. During the procedure, the instrument used will be placed in your mouth and passed down into your esophagus. Options include: ? Simple dilators. This instrument is carefully placed in the esophagus to stretch it. ? Guided wire bougies. In this method, a flexible tube (endoscope) is used to insert a wire into the  esophagus. The dilator is passed over this wire to enlarge the esophagus. Then the wire is removed. ? Balloon dilators. An endoscope with a small balloon at the end is passed down into the esophagus. Inflating the balloon gently stretches the esophagus and opens it up. What happens after the procedure?  Your blood pressure, heart rate, breathing rate, and blood oxygen level will be monitored often until the medicines you were given have worn off.  Your throat may feel slightly sore and will probably still feel numb. This will improve slowly over time.  You will not be allowed to eat or drink until the throat numbness has resolved.  If this is a same-day procedure, you may be allowed  to go home once you have been able to drink, urinate, and sit on the edge of the bed without nausea or dizziness.  If this is a same-day procedure, you should have a friend or family member with you for the next 24 hours after the procedure. This information is not intended to replace advice given to you by your health care provider. Make sure you discuss any questions you have with your health care provider. Document Released: 12/30/2005 Document Revised: 04/15/2016 Document Reviewed: 03/20/2014 Elsevier Interactive Patient Education  2018 [place] Anesthesia is a term that refers to techniques, procedures, and medicines that help a person stay safe and comfortable during a medical procedure. Monitored anesthesia care, or sedation, is one type of anesthesia. Your anesthesia specialist may recommend sedation if you will be having a procedure that does not require you to be unconscious, such as:  Cataract surgery.  A dental procedure.  A biopsy.  A colonoscopy.  During the procedure, you may receive a medicine to help you relax (sedative). There are three levels of sedation:  Mild sedation. At this level, you may feel awake and relaxed. You will be able to follow  directions.  Moderate sedation. At this level, you will be sleepy. You may not remember the procedure.  Deep sedation. At this level, you will be asleep. You will not remember the procedure.  The more medicine you are given, the deeper your level of sedation will be. Depending on how you respond to the procedure, the anesthesia specialist may change your level of sedation or the type of anesthesia to fit your needs. An anesthesia specialist will monitor you closely during the procedure. Let your health care provider know about:  Any allergies you have.  All medicines you are taking, including vitamins, herbs, eye drops, creams, and over-the-counter medicines.  Any use of steroids (by mouth or as a cream).  Any problems you or family members have had with sedatives and anesthetic medicines.  Any blood disorders you have.  Any surgeries you have had.  Any medical conditions you have, such as sleep apnea.  Whether you are pregnant or may be pregnant.  Any use of cigarettes, alcohol, or street drugs. What are the risks? Generally, this is a safe procedure. However, problems may occur, including:  Getting too much medicine (oversedation).  Nausea.  Allergic reaction to medicines.  Trouble breathing. If this happens, a breathing tube may be used to help with breathing. It will be removed when you are awake and breathing on your own.  Heart trouble.  Lung trouble.  Before the procedure Staying hydrated Follow instructions from your health care provider about hydration, which may include:  Up to 2 hours before the procedure - you may continue to drink clear liquids, such as water, clear fruit juice, black coffee, and plain tea.  Eating and drinking restrictions Follow instructions from your health care provider about eating and drinking, which may include:  8 hours before the procedure - stop eating heavy meals or foods such as meat, fried foods, or fatty foods.  6 hours  before the procedure - stop eating light meals or foods, such as toast or cereal.  6 hours before the procedure - stop drinking milk or drinks that contain milk.  2 hours before the procedure - stop drinking clear liquids.  Medicines Ask your health care provider about:  Changing or stopping your regular medicines. This is especially important if you are taking diabetes medicines  or blood thinners.  Taking medicines such as aspirin and ibuprofen. These medicines can thin your blood. Do not take these medicines before your procedure if your health care provider instructs you not to.  Tests and exams  You will have a physical exam.  You may have blood tests done to show: ? How well your kidneys and liver are working. ? How well your blood can clot.  General instructions  Plan to have someone take you home from the hospital or clinic.  If you will be going home right after the procedure, plan to have someone with you for 24 hours.  What happens during the procedure?  Your blood pressure, heart rate, breathing, level of pain and overall condition will be monitored.  An IV tube will be inserted into one of your veins.  Your anesthesia specialist will give you medicines as needed to keep you comfortable during the procedure. This may mean changing the level of sedation.  The procedure will be performed. After the procedure  Your blood pressure, heart rate, breathing rate, and blood oxygen level will be monitored until the medicines you were given have worn off.  Do not drive for 24 hours if you received a sedative.  You may: ? Feel sleepy, clumsy, or nauseous. ? Feel forgetful about what happened after the procedure. ? Have a sore throat if you had a breathing tube during the procedure. ? Vomit. This information is not intended to replace advice given to you by your health care provider. Make sure you discuss any questions you have with your health care provider. Document  Released: 08/04/2005 Document Revised: 04/16/2016 Document Reviewed: 02/29/2016 Elsevier Interactive Patient Education  2018 [place], Care After These instructions provide you with information about caring for yourself after your procedure. Your health care provider may also give you more specific instructions. Your treatment has been planned according to current medical practices, but problems sometimes occur. Call your health care provider if you have any problems or questions after your procedure. What can I expect after the procedure? After your procedure, it is common to:  Feel sleepy for several hours.  Feel clumsy and have poor balance for several hours.  Feel forgetful about what happened after the procedure.  Have poor judgment for several hours.  Feel nauseous or vomit.  Have a sore throat if you had a breathing tube during the procedure.  Follow these instructions at home: For at least 24 hours after the procedure:   Do not: ? Participate in activities in which you could fall or become injured. ? Drive. ? Use heavy machinery. ? Drink alcohol. ? Take sleeping pills or medicines that cause drowsiness. ? Make important decisions or sign legal documents. ? Take care of children on your own.  Rest. Eating and drinking  Follow the diet that is recommended by your health care provider.  If you vomit, drink water, juice, or soup when you can drink without vomiting.  Make sure you have little or no nausea before eating solid foods. General instructions  Have a responsible adult stay with you until you are awake and alert.  Take over-the-counter and prescription medicines only as told by your health care provider.  If you smoke, do not smoke without supervision.  Keep all follow-up visits as told by your health care provider. This is important. Contact a health care provider if:  You keep feeling nauseous or you keep  vomiting.  You feel light-headed.  You develop  a rash.  You have a fever. Get help right away if:  You have trouble breathing. This information is not intended to replace advice given to you by your health care provider. Make sure you discuss any questions you have with your health care provider. Document Released: 02/29/2016 Document Revised: 06/30/2016 Document Reviewed: 02/29/2016 Elsevier Interactive Patient Education  [employer].

## 2018-07-06 ENCOUNTER — Encounter (HOSPITAL_COMMUNITY)
Admission: RE | Admit: 2018-07-06 | Discharge: 2018-07-06 | Disposition: A | Discharge status: Home / Self Care | Payer: Medicare Other | Source: Ambulatory Visit | Attending: Gastroenterology | Admitting: Gastroenterology

## 2018-07-06 ENCOUNTER — Encounter (HOSPITAL_COMMUNITY): Payer: Self-pay

## 2018-07-11 ENCOUNTER — Ambulatory Visit (HOSPITAL_COMMUNITY): Payer: Medicare Other | Admitting: Anesthesiology

## 2018-07-11 ENCOUNTER — Encounter (HOSPITAL_COMMUNITY): Payer: Self-pay

## 2018-07-11 ENCOUNTER — Ambulatory Visit (HOSPITAL_COMMUNITY)
Admission: RE | Admit: 2018-07-11 | Discharge: 2018-07-11 | Disposition: A | Discharge status: Home / Self Care | Payer: Medicare Other | Source: Ambulatory Visit | Attending: Gastroenterology | Admitting: Gastroenterology

## 2018-07-11 ENCOUNTER — Encounter (HOSPITAL_COMMUNITY)
Admission: RE | Disposition: A | Discharge status: Home / Self Care | Payer: Self-pay | Source: Ambulatory Visit | Attending: Gastroenterology

## 2018-07-11 ENCOUNTER — Other Ambulatory Visit: Payer: Self-pay

## 2018-07-11 DIAGNOSIS — K2970 Gastritis, unspecified, without bleeding: Secondary | ICD-10-CM | POA: Diagnosis not present

## 2018-07-11 DIAGNOSIS — I739 Peripheral vascular disease, unspecified: Secondary | ICD-10-CM | POA: Diagnosis not present

## 2018-07-11 DIAGNOSIS — K319 Disease of stomach and duodenum, unspecified: Secondary | ICD-10-CM | POA: Diagnosis not present

## 2018-07-11 DIAGNOSIS — Z79899 Other long term (current) drug therapy: Secondary | ICD-10-CM | POA: Insufficient documentation

## 2018-07-11 DIAGNOSIS — R197 Diarrhea, unspecified: Secondary | ICD-10-CM

## 2018-07-11 DIAGNOSIS — Z79890 Hormone replacement therapy (postmenopausal): Secondary | ICD-10-CM | POA: Insufficient documentation

## 2018-07-11 DIAGNOSIS — R1310 Dysphagia, unspecified: Secondary | ICD-10-CM

## 2018-07-11 DIAGNOSIS — R1013 Epigastric pain: Secondary | ICD-10-CM | POA: Insufficient documentation

## 2018-07-11 DIAGNOSIS — I10 Essential (primary) hypertension: Secondary | ICD-10-CM | POA: Diagnosis not present

## 2018-07-11 DIAGNOSIS — K219 Gastro-esophageal reflux disease without esophagitis: Secondary | ICD-10-CM | POA: Diagnosis not present

## 2018-07-11 DIAGNOSIS — Z87891 Personal history of nicotine dependence: Secondary | ICD-10-CM | POA: Diagnosis not present

## 2018-07-11 DIAGNOSIS — Z7951 Long term (current) use of inhaled steroids: Secondary | ICD-10-CM | POA: Insufficient documentation

## 2018-07-11 DIAGNOSIS — K3189 Other diseases of stomach and duodenum: Secondary | ICD-10-CM | POA: Diagnosis not present

## 2018-07-11 HISTORY — PX: SAVORY DILATION: SHX5439

## 2018-07-11 HISTORY — PX: BIOPSY: SHX5522

## 2018-07-11 HISTORY — PX: ESOPHAGOGASTRODUODENOSCOPY (EGD) WITH PROPOFOL: SHX5813

## 2018-07-11 SURGERY — ESOPHAGOGASTRODUODENOSCOPY (EGD) WITH PROPOFOL
Anesthesia: Monitor Anesthesia Care

## 2018-07-11 MED ORDER — CHLORHEXIDINE GLUCONATE CLOTH 2 % EX PADS: 6.0000 | MEDICATED_PAD | Freq: Once | CUTANEOUS | Status: DC

## 2018-07-11 MED ORDER — GLYCOPYRROLATE 0.2 MG/ML IJ SOLN
INTRAMUSCULAR | Status: DC | PRN
  Administered 2018-07-11: 0.2 mg via INTRAVENOUS

## 2018-07-11 MED ORDER — GLYCOPYRROLATE 0.2 MG/ML IJ SOLN
INTRAMUSCULAR | Status: AC
  Filled 2018-07-11: qty 1

## 2018-07-11 MED ORDER — PROPOFOL 10 MG/ML IV BOLUS
INTRAVENOUS | Status: DC | PRN
  Administered 2018-07-11: 40 mg via INTRAVENOUS

## 2018-07-11 MED ORDER — MINERAL OIL PO OIL
TOPICAL_OIL | ORAL | Status: AC
  Filled 2018-07-11: qty 30

## 2018-07-11 MED ORDER — LIDOCAINE HCL (CARDIAC) PF 50 MG/5ML IV SOSY
PREFILLED_SYRINGE | INTRAVENOUS | Status: DC | PRN
  Administered 2018-07-11: 40 mg via INTRAVENOUS

## 2018-07-11 MED ORDER — LACTATED RINGERS IV SOLN: INTRAVENOUS | Status: DC

## 2018-07-11 MED ORDER — LACTATED RINGERS IV SOLN
INTRAVENOUS | Status: DC
  Administered 2018-07-11: 08:00:00 via INTRAVENOUS

## 2018-07-11 MED ORDER — LIDOCAINE HCL (PF) 1 % IJ SOLN
INTRAMUSCULAR | Status: AC
  Filled 2018-07-11: qty 5

## 2018-07-11 MED ORDER — PROPOFOL 500 MG/50ML IV EMUL
INTRAVENOUS | Status: DC | PRN
  Administered 2018-07-11: 150 ug/kg/min via INTRAVENOUS

## 2018-07-11 MED ORDER — PROPOFOL 10 MG/ML IV BOLUS
INTRAVENOUS | Status: AC
  Filled 2018-07-11: qty 60

## 2018-07-11 MED ORDER — PROMETHAZINE HCL 25 MG/ML IJ SOLN: 6.2500 mg | INTRAMUSCULAR | Status: DC | PRN

## 2018-07-11 MED ORDER — LIDOCAINE VISCOUS HCL 2 % MT SOLN
OROMUCOSAL | Status: AC
  Filled 2018-07-11: qty 45

## 2018-07-11 MED ORDER — ONDANSETRON HCL 4 MG/2ML IJ SOLN
INTRAMUSCULAR | Status: DC | PRN
  Administered 2018-07-11: 30 mg via INTRAVENOUS

## 2018-07-11 NOTE — Anesthesia Postprocedure Evaluation (Signed)
Anesthesia Post Note  Patient: Natasha Wright  Procedure(s) Performed: ESOPHAGOGASTRODUODENOSCOPY (EGD) WITH PROPOFOL (N/A ) SAVORY DILATION (N/A ) BIOPSY  Patient location during evaluation: PACU Anesthesia Type: MAC Level of consciousness: awake and alert and oriented Pain management: pain level controlled Vital Signs Assessment: post-procedure vital signs reviewed and stable Respiratory status: spontaneous breathing Cardiovascular status: stable Postop Assessment: no apparent nausea or vomiting Anesthetic complications: no     Last Vitals:  Vitals:   07/11/18 0815 07/11/18 0820  BP: 128/77 133/75  Pulse:    Resp: 20 20  Temp:    SpO2:  98%    Last Pain:  Vitals:   07/11/18 0808  TempSrc: Oral  PainSc: 6                  Shaquel Josephson A

## 2018-07-11 NOTE — Discharge Instructions (Signed)
I dilated your esophagus. I STRETCHED YOUR ESOPHAGUS DUE TO YOUR PROBLEM SWALLOWING.  You have mild gastritis. I biopsied your stomach AND SMALL BOWEL.   DRINK WATER TO KEEP YOUR URINE LIGHT YELLOW.  AVOID REFLUX TRIGGERS. SEE INFO BELOW.  STRICTLY FOLLOW A LOW FAT DIET. SEE INFO BELOW.   CONTINUE PROTONIX. TAKE 30 MINUTES PRIOR TO MEALS TWICE DAILY.  YOUR BIOPSY WILL BE BACK IN 7 DAYS.   FOLLOW UP IN NOV 2019.  UPPER ENDOSCOPY AFTER CARE Read the instructions outlined below and refer to this sheet in the next week. These discharge instructions provide you with general information on caring for yourself after you leave the hospital. While your treatment has been planned according to the most current medical practices available, unavoidable complications occasionally occur. If you have any problems or questions after discharge, call DR. Janeya Deyo, [phone].  ACTIVITY  You may resume your regular activity, but move at a slower pace for the next 24 hours.   Take frequent rest periods for the next 24 hours.   Walking will help get rid of the air and reduce the bloated feeling in your belly (abdomen).   No driving for 24 hours (because of the medicine (anesthesia) used during the test).   You may shower.   Do not sign any important legal documents or operate any machinery for 24 hours (because of the anesthesia used during the test).    NUTRITION  Drink plenty of fluids.   You may resume your normal diet as instructed by your doctor.   Begin with a light meal and progress to your normal diet. Heavy or fried foods are harder to digest and may make you feel sick to your stomach (nauseated).   Avoid alcoholic beverages for 24 hours or as instructed.    MEDICATIONS  You may resume your normal medications.   WHAT YOU CAN EXPECT TODAY  Some feelings of bloating in the abdomen.   Passage of more gas than usual.    IF YOU HAD A BIOPSY TAKEN DURING THE UPPER  ENDOSCOPY:  Eat a soft diet IF YOU HAVE NAUSEA, BLOATING, ABDOMINAL PAIN, OR VOMITING.    FINDING OUT THE RESULTS OF YOUR TEST Not all test results are available during your visit. DR. Oneida Alar WILL CALL YOU WITHIN 14 DAYS OF YOUR PROCEDUE WITH YOUR RESULTS. Do not assume everything is normal if you have not heard from DR. Mikael Debell, CALL HER OFFICE AT [phone].  SEEK IMMEDIATE MEDICAL ATTENTION AND CALL THE OFFICE: [phone] IF:  You have more than a spotting of blood in your stool.   Your belly is swollen (abdominal distention).   You are nauseated or vomiting.   You have a temperature over 101F.   You have abdominal pain or discomfort that is severe or gets worse throughout the day.  Gastritis  Gastritis is an inflammation (the body's way of reacting to injury and/or infection) of the stomach. It is often caused by viral or bacterial (germ) infections. It can also be caused BY ASPIRIN, BC/GOODY POWDER'S, (IBUPROFEN) MOTRIN, OR ALEVE (NAPROXEN), chemicals (including alcohol), SPICY FOODS, and medications. This illness may be associated with generalized malaise (feeling tired, not well), UPPER ABDOMINAL STOMACH cramps, and fever. One common bacterial cause of gastritis is an organism known as H. Pylori. This can be treated with antibiotics.   ESOPHAGEAL STRICTURE  Esophageal strictures can be caused by stomach acid backing up into the tube that carries food from the mouth down to the stomach (lower esophagus).  TREATMENT There are a number of  medicines used to treat reflux/stricture, including: Antacids.  Proton-pump inhibitors: OMEPRAZOLE PEPCID OR ZANTAC  TAKE YOUR ASPIRIN DAILY.  CONTINUE OMEPRAZOLE TWICE DAILY. TAKE 30 MINUTES TO 1 HOUR PRIOR TO MEALS.  CONTINUE TENUATE. TAKE  HOUR BEFORE MEALS UP TO 3 TIMES A DAY.  DRINK WATER. AVOID KOOL-AID, SWEET TEA, JUICE, & SODA.  FOLLOW UP IN 2 MOS.     Lifestyle and home remedies TO CONTROL REFLUX/HEARTBURN You may  eliminate or reduce the frequency of heartburn by making the following lifestyle changes:   Control your weight. Being overweight is a major risk factor for heartburn and GERD. Excess pounds put pressure on your abdomen, pushing up your stomach and causing acid to back up into your esophagus.    Eat smaller meals. 4 TO 6 MEALS A DAY. This reduces pressure on the lower esophageal sphincter, helping to prevent the valve from opening and acid from washing back into your esophagus.    Loosen your belt. Clothes that fit tightly around your waist put pressure on your abdomen and the lower esophageal sphincter.    Eliminate heartburn triggers. Everyone has specific triggers. Common triggers such as fatty or fried foods, spicy food, tomato sauce, carbonated beverages, alcohol, chocolate, mint, garlic, onion, caffeine and nicotine may make heartburn worse.    Avoid stooping or bending. Tying your shoes is OK. Bending over for longer periods to weed your garden isn't, especially soon after eating.    Don't lie down after a meal. Wait at least three to four hours after eating before going to bed, and don't lie down right after eating.    Alternative medicine  Several home remedies exist for treating GERD, but they provide only temporary relief. They include drinking baking soda (sodium bicarbonate) added to water or drinking other fluids such as baking soda mixed with cream of tartar and water.   Although these liquids create temporary relief by neutralizing, washing away or buffering acids, eventually they aggravate the situation by adding gas and fluid to your stomach, increasing pressure and causing more acid reflux. Further, adding more sodium to your diet may increase your blood pressure and add stress to your heart, and excessive bicarbonate ingestion can alter the acid-base balance in your body.   Low-Fat Diet  BREADS, CEREALS, PASTA, RICE, DRIED PEAS, AND BEANS  These products are  high in carbohydrates and most are low in fat. Therefore, they can be increased in the diet as substitutes for fatty foods. They too, however, contain calories and should not be eaten in excess. Cereals can be eaten for snacks as well as for breakfast.   Include foods that contain fiber (fruits, vegetables, whole grains, and legumes). Research shows that fiber may lower blood cholesterol levels, especially the water-soluble fiber found in fruits, vegetables, oat products, and legumes.  FRUITS AND VEGETABLES  It is good to eat fruits and vegetables. Besides being sources of fiber, both are rich in vitamins and some minerals. They help you get the daily allowances of these nutrients. Fruits and vegetables can be used for snacks and desserts.  MEATS  Limit lean meat, chicken, [place], and fish to no more than 6 ounces per day.  Beef, Pork, and Lamb  Use lean cuts of beef, pork, and lamb. Lean cuts include:   Extra-lean ground beef.   Arm roast.   Sirloin tip.   Center-cut ham.   Round steak.   Loin chops.   Rump roast.  Tenderloin.   Trim all fat off the outside of meats before cooking. It is not necessary to severely decrease the intake of red meat, but lean choices should be made. Lean meat is rich in protein and contains a highly absorbable form of iron. Premenopausal women, in particular, should avoid reducing lean red meat because this could increase the risk for low red blood cells (iron-deficiency anemia).  Processed Meats  Processed meats, such as bacon, bologna, salami, sausage, and hot dogs contain large quantities of fat, are not rich in valuable nutrients, and should not be eaten very often.  Organ Meats  The organ meats, such as liver, sweetbreads, kidneys, and brain are very rich in cholesterol. They should be limited.  Chicken and [place]  These are good sources of protein. The fat of poultry can be reduced by removing the skin and underlying fat layers before  cooking. Chicken and [place] can be substituted for lean red meat in the diet. Poultry should not be fried or covered with high-fat sauces.  Fish and Shellfish  Fish is a good source of protein. Shellfish contain cholesterol, but they usually are low in saturated fatty acids. The preparation of fish is important. Like chicken and [place], they should not be fried or covered with high-fat sauces.  EGGS  Egg yolks often are hidden in cooked and processed foods. Egg whites contain no fat or cholesterol. They can be eaten often. Try 1 to 2 egg whites instead of whole eggs in recipes or use egg substitutes that do not contain yolk.  MILK AND DAIRY PRODUCTS  Use skim or 1% milk instead of 2% or whole milk. Decrease whole milk, natural, and processed cheeses. Use nonfat or low-fat (2%) cottage cheese or low-fat cheeses made from vegetable oils. Choose nonfat or low-fat (1 to 2%) yogurt. Experiment with evaporated skim milk in recipes that call for heavy cream. Substitute low-fat yogurt or low-fat cottage cheese for sour cream in dips and salad dressings. Have at least 2 servings of low-fat dairy products, such as 2 glasses of skim (or 1%) milk each day to help get your daily calcium intake.  FATS AND OILS  Reduce the total intake of fats, especially saturated fat. Butterfat, lard, and beef fats are high in saturated fat and cholesterol. These should be avoided as much as possible. Vegetable fats do not contain cholesterol, but certain vegetable fats, such as coconut oil, palm oil, and palm kernel oil are very high in saturated fats. These should be limited. These fats are often used in bakery goods, processed foods, popcorn, oils, and nondairy creamers. Vegetable shortenings and some peanut butters contain hydrogenated oils, which are also saturated fats. Read the labels on these foods and check for saturated vegetable oils.  Unsaturated vegetable oils and fats do not raise blood cholesterol. However, they  should be limited because they are fats and are high in calories. Total fat should still be limited to 30% of your daily caloric intake. Desirable liquid vegetable oils are corn oil, cottonseed oil, olive oil, canola oil, safflower oil, soybean oil, and sunflower oil. Peanut oil is not as good, but small amounts are acceptable. Buy a heart-healthy tub margarine that has no partially hydrogenated oils in the ingredients. Mayonnaise and salad dressings often are made from unsaturated fats, but they should also be limited because of their high calorie and fat content.  Seeds, nuts, peanut butter, olives, and avocados are high in fat, but the fat is mainly the unsaturated type. These  foods should be limited mainly to avoid excess calories and fat.  OTHER EATING TIPS  Snacks   Most sweets should be limited as snacks. They tend to be rich in calories and fats, and their caloric content outweighs their nutritional value. Some good choices in snacks are graham crackers, melba toast, soda crackers, bagels (no egg), English muffins, fruits, and vegetables. These snacks are preferable to snack crackers, [place] fries, and chips. Popcorn should be air-popped or cooked in small amounts of liquid vegetable oil.  Desserts  Eat fruit, low-fat yogurt, and fruit ices instead of pastries, cake, and cookies. Sherbet, angel food cake, gelatin dessert, frozen low-fat yogurt, or other frozen products that do not contain saturated fat (pure fruit juice bars, frozen ice pops) are also acceptable.   COOKING METHODS  Choose those methods that use little or no fat. They include:  Poaching.   Braising.   Steaming.   Grilling.   Baking.   Stir-frying.   Broiling.   Microwaving.   Foods can be cooked in a nonstick pan without added fat, or use a nonfat cooking spray in regular cookware. Limit fried foods and avoid frying in saturated fat. Add moisture to lean meats by using water, broth, cooking wines, and other  nonfat or low-fat sauces along with the cooking methods mentioned above.  Soups and stews should be chilled after cooking. The fat that forms on top after a few hours in the refrigerator should be skimmed off. When preparing meals, avoid using excess salt. Salt can contribute to raising blood pressure in some people.  EATING AWAY FROM HOME  Order entres, potatoes, and vegetables without sauces or butter. When meat exceeds the size of a deck of cards (3 to 4 ounces), the rest can be taken home for another meal.  Choose vegetable or fruit salads and ask for low-calorie salad dressings to be served on the side. Use dressings sparingly. Limit high-fat toppings, such as bacon, crumbled eggs, cheese, sunflower seeds, and olives. Ask for heart-healthy tub margarine instead of butter.

## 2018-07-11 NOTE — Transfer of Care (Signed)
Immediate Anesthesia Transfer of Care Note  Patient: Natasha Wright  Procedure(s) Performed: ESOPHAGOGASTRODUODENOSCOPY (EGD) WITH PROPOFOL (N/A ) SAVORY DILATION (N/A ) BIOPSY  Patient Location: PACU  Anesthesia Type:MAC  Level of Consciousness: awake, alert , oriented and patient cooperative  Airway & Oxygen Therapy: Patient Spontanous Breathing  Post-op Assessment: Report given to RN and Post -op Vital signs reviewed and stable  Post vital signs: Reviewed and stable  Last Vitals:  Vitals Value Taken Time  BP    Temp    Pulse 89 07/11/2018  8:55 AM  Resp 13 07/11/2018  8:55 AM  SpO2 98 % 07/11/2018  8:55 AM  Vitals shown include unvalidated device data.  Last Pain:  Vitals:   07/11/18 0808  TempSrc: Oral  PainSc: 6       Patients Stated Pain Goal: 8 (84/53/64 6803)  Complications: No apparent anesthesia complications

## 2018-07-11 NOTE — H&P (Signed)
Primary Care Physician:  Redmond School, MD Primary Gastroenterologist:  Dr. Oneida Alar  Pre-Procedure History & Physical: HPI:  Natasha Wright is a 57 y.o. female here for DYSPHAGIA/dyspepsia/INTERMITTENT DIARRHEA.  Past Medical History:  Diagnosis Date  . Anxiety   . Arthritis   . B12 deficiency    h/o  . Family history of heart disease   . GERD (gastroesophageal reflux disease)   . Hypoglycemia   . IBS (irritable bowel syndrome)   . Lumbar disc disease   . OA (osteoarthritis)   . PONV (postoperative nausea and vomiting)    needs scop patch  . Rectocele   . Shingles   . Spinal stenosis in cervical region     Past Surgical History:  Procedure Laterality Date  . ABDOMINAL HYSTERECTOMY  1992  . ARTHRODESIS FOOT WITH WEIL OSTEOTOMY Left 05/09/2014   Procedure: LEFT FIRST TARSAL METATARSAL  ARTHRODESIS; LEFT SECOND WEIL OSTEOTOMY;  Surgeon: Wylene Simmer, MD;  Location: [facility];  Service: Orthopedics;  Laterality: Left;  . BUNIONECTOMY WITH HAMMERTOE RECONSTRUCTION Left 05/09/2014   Procedure: LEFT MODIFIED MCBRIDE BUNIONECTOMY; LEFT SECOND HAMMER TOE CORRECTION;  Surgeon: Wylene Simmer, MD;  Location: [facility];  Service: Orthopedics;  Laterality: Left;  . CERVICAL SPINE SURGERY  2009  . CHOLECYSTECTOMY  2004   lap choli  . COLONOSCOPY  07/08/2003   RMR: Left-sided diverticula, remainder of colonic mucosa and terminal ileum appeared normal.. Minimally friable internal hemorrhoids, otherwise normal rectum  . COLONOSCOPY N/A 04/30/2013   Dr. Oneida Alar: mild diverticulosis in descending and sigmoid colon, internal hemorrhoids.   [name] DILATION AND CURETTAGE OF UTERUS    . NM MYOCAR PERF WALL MOTION  10/2011   bruce myoview - breast attenuation noted in anterior region; EF 65%; no ischemia/infarct/scar; low risk  . TONSILLECTOMY  1980  . TUBAL LIGATION      Prior to Admission medications   Medication Sig Start Date End Date Taking? Authorizing Provider   acetaminophen (TYLENOL) 650 MG CR tablet Take 1,300 mg by mouth 2 (two) times daily as needed for pain.   Yes [provider]  citalopram (CELEXA) 10 MG tablet Take 10 mg by mouth daily. 06/14/18  Yes [provider]  diazepam (VALIUM) 10 MG tablet Take 10 mg by mouth every 8 (eight) hours as needed (muscle spasms).    Yes [provider]  dicyclomine (BENTYL) 20 MG tablet Take 20 mg by mouth 4 (four) times daily as needed for spasms.   Yes [provider]  diltiazem (CARDIZEM) 60 MG tablet Take 1 tablet (60 mg total) by mouth 2 (two) times daily. 06/13/18  Yes Herminio Commons, MD  estradiol (ESTRACE) 0.5 MG tablet Take 0.5 mg by mouth at bedtime.    Yes [provider]  fluticasone (FLONASE) 50 MCG/ACT nasal spray Place 1 spray into both nostrils 2 (two) times daily.   Yes [provider]  ipratropium (ATROVENT) 0.06 % nasal spray Place 2 sprays into both nostrils daily.   Yes [provider]  Multiple Vitamin (MULTIVITAMIN WITH MINERALS) TABS tablet Take 1 tablet by mouth daily.   Yes [provider]  pantoprazole (PROTONIX) 40 MG tablet 1 PO 30 MINUTES PRIOR TO MEALS BID. Patient taking differently: Take 40 mg by mouth 2 (two) times daily before a meal.  06/08/18  Yes Danie Binder, MD    Allergies as of 06/08/2018 - Review Complete 06/08/2018  Allergen Reaction Noted  . Penicillins Shortness Of Breath and Rash   .  Codeine    . Fentanyl    . Morphine    . Nsaids  04/23/2013  . Sulfamethoxazole-trimethoprim    . Influenza vaccines Rash 03/06/2014    Family History  Problem Relation Age of Onset  . Diabetes Mother   . Hypertension Mother   . Hyperlipidemia Mother   . Ulcerative colitis Father   . Breast cancer Maternal Grandmother   . Hypertension Maternal Grandmother   . Diabetes Maternal Grandmother   . Stroke Maternal Grandmother   . Asthma Maternal Grandmother   . Stroke Maternal Grandfather   .  Stroke Paternal Grandmother   . Diabetes Paternal Grandmother   . Heart disease Paternal Grandmother   . Heart disease Paternal Grandfather   . Hyperlipidemia Brother   . Hyperlipidemia Sister   . Hypertension Sister   . Diabetes Sister   . Down syndrome Child   . Heart disease Child   . Colon cancer Neg Hx     Social History   Socioeconomic History  . Marital status: Married    Spouse name: Mikeal Hawthorne  . Number of children: 1  . Years of education: 78  . Highest education level: Not on file  Occupational History  . Occupation: Unemployed    Employer: [employer]    Employer: UNEMPLOYED  Social Needs  . Financial resource strain: Not on file  . Food insecurity:    Worry: Not on file    Inability: Not on file  . Transportation needs:    Medical: Not on file    Non-medical: Not on file  Tobacco Use  . Smoking status: Former Smoker    Packs/day: 0.50    Years: 40.00    Pack years: 20.00    Types: Cigarettes    Last attempt to quit: 12/24/2013    Years since quitting: 4.5  . Smokeless tobacco: Never Used  . Tobacco comment: using vape as aid to sustain from smoking  Substance and Sexual Activity  . Alcohol use: No  . Drug use: No  . Sexual activity: Not on file  Lifestyle  . Physical activity:    Days per week: Not on file    Minutes per session: Not on file  . Stress: Not on file  Relationships  . Social connections:    Talks on phone: Not on file    Gets together: Not on file    Attends religious service: Not on file    Active member of club or organization: Not on file    Attends meetings of clubs or organizations: Not on file    Relationship status: Not on file  . Intimate partner violence:    Fear of current or ex partner: Not on file    Emotionally abused: Not on file    Physically abused: Not on file    Forced sexual activity: Not on file  Other Topics Concern  . Not on file  Social History Narrative   One son, Down syndrome,is right handed  ,resides with son and husband    Review of Systems: See HPI, otherwise negative ROS   Physical Exam: There were no vitals taken for this visit. General:   Alert,  pleasant and cooperative in NAD Head:  Normocephalic and atraumatic. Neck:  Supple; Lungs:  Clear throughout to auscultation.    Heart:  Regular rate and rhythm. Abdomen:  Soft, nontender and nondistended. Normal bowel sounds, without guarding, and without rebound.   Neurologic:  Alert and  oriented x4;  grossly normal  neurologically.  Impression/Plan:    DYSPHAGIA/dyspepsia/INTERMITTENT DIARRHEA  PLAN:  EGD/DIL TODAY WITH GASTRIC AND DUODENAL BIOPSIES. DISCUSSED PROCEDURE, BENEFITS, & RISKS: < 1% chance of medication reaction, bleeding, OR perforation.

## 2018-07-11 NOTE — Anesthesia Procedure Notes (Signed)
Procedure Name: [place] Performed by: Andree Elk Janaysha Depaulo A, CRNA Pre-anesthesia Checklist: Patient identified, Emergency Drugs available, Suction available, Patient being monitored and Timeout performed Oxygen Delivery Method: Simple face mask

## 2018-07-11 NOTE — Op Note (Addendum)
[facility] Patient Name: Natasha Wright Procedure Date: 07/11/2018 8:17 AM MRN: [id number] Date of Birth: 05-Jul-1961 Attending MD: Barney Drain MD, MD CSN: [id number] Age: 57 Admit Type: Outpatient Procedure:                Upper GI endoscopy WITH COLD FORCEPS                            BIOPSY/ESOPHAGEAL DILATION Indications:              Dyspepsia, Dysphagia, Diarrhea Providers:                Barney Drain MD, MD, Janeece Riggers, RN, Randa Spike, Technician Referring MD:             Redmond School, MD Medicines:                Propofol per Anesthesia Complications:            No immediate complications. Estimated Blood Loss:     Estimated blood loss was minimal. Procedure:                Pre-Anesthesia Assessment:                           - Prior to the procedure, a History and Physical                            was performed, and patient medications and                            allergies were reviewed. The patient's tolerance of                            previous anesthesia was also reviewed. The risks                            and benefits of the procedure and the sedation                            options and risks were discussed with the patient.                            All questions were answered, and informed consent                            was obtained. Prior Anticoagulants: The patient has                            taken no previous anticoagulant or antiplatelet                            agents. ASA Grade Assessment: II - A patient with  mild systemic disease. After reviewing the risks                            and benefits, the patient was deemed in                            satisfactory condition to undergo the procedure.                            After obtaining informed consent, the endoscope was                            passed under direct vision. Throughout the   procedure, the patient's blood pressure, pulse, and                            oxygen saturations were monitored continuously. The                            [id number]-[id number] ([id number]) scope was introduced through the                            mouth, and advanced to the second part of duodenum.                            The upper GI endoscopy was accomplished without                            difficulty. The patient tolerated the procedure                            well. Scope In: 8:35:49 AM Scope Out: 8:47:54 AM Total Procedure Duration: 0 hours 12 minutes 5 seconds  Findings:      No endoscopic abnormality was evident in the esophagus to explain the       patient's complaint of dysphagia. It was decided, however, to proceed       with dilation of the entire esophagus DUE TO POSSIBLE PROXIMAL       ESOPHAGEAL WEB. A guidewire was placed and the scope was withdrawn.       Dilation was performed with a Savary dilator with mild resistance at 16       mm and 17 mm. Estimated blood loss was minimal.      Patchy mild inflammation characterized by congestion (edema) and       erythema was found in the gastric antrum. Biopsies(2: BODY, 3: ANTRUM)       were taken with a cold forceps for Helicobacter pylori testing.      The examined duodenum was normal. Biopsies for histology were taken with       a cold forceps for evaluation of celiac disease(2: BULB, 4: 2ND PORTION). Impression:               - No endoscopic esophageal abnormality to explain                            patient's dysphagia. Esophagus dilated. DYPSHAGIA  MOST LIKELY DUE TO PROXIMAL ESOPHAGEAL WEB OR                            UNCONTROLLED GERD                           - MILD Gastritis. Biopsied. Moderate Sedation:      Per Anesthesia Care Recommendation:           - Patient has a contact number available for                            emergencies. The signs and symptoms of potential                             delayed complications were discussed with the                            patient. Return to normal activities tomorrow.                            Written discharge instructions were provided to the                            patient.                           - Low fat diet.                           - Continue present medications.                           - Await pathology results.                           - Return to my office in 3 months. Procedure Code(s):        --- Professional ---                           [phone], Esophagogastroduodenoscopy, flexible,                            transoral; with insertion of guide wire followed by                            passage of dilator(s) through esophagus over guide                            wire                           43239, Esophagogastroduodenoscopy, flexible,                            transoral; with biopsy, single or multiple Diagnosis Code(s):        --- Professional ---  R13.10, Dysphagia, unspecified                           K29.70, Gastritis, unspecified, without bleeding                           R10.13, Epigastric pain                           R19.7, Diarrhea, unspecified CPT copyright 2017 American Medical Association. All rights reserved. The codes documented in this report are preliminary and upon coder review may  be revised to meet current compliance requirements. Barney Drain, MD Barney Drain MD, MD 07/11/2018 8:58:19 AM This report has been signed electronically. Number of Addenda: 0

## 2018-07-11 NOTE — Anesthesia Preprocedure Evaluation (Signed)
Anesthesia Evaluation  Patient identified by MRN, date of birth, ID band Patient awake    Reviewed: Allergy & Precautions, H&P , NPO status , Patient's Chart, lab work & pertinent test results, reviewed documented beta blocker date and time   History of Anesthesia Complications (+) PONV and history of anesthetic complications  Airway Mallampati: II  TM Distance: >3 FB Neck ROM: full    Dental no notable dental hx. (+) Teeth Intact, Dental Advidsory Given   Pulmonary neg pulmonary ROS, former smoker,    Pulmonary exam normal breath sounds clear to auscultation       Cardiovascular Exercise Tolerance: Good hypertension, + Peripheral Vascular Disease and + DOE  negative cardio ROS  Supra Ventricular Tachycardia  Rhythm:regular Rate:Normal     Neuro/Psych Anxiety  Neuromuscular disease negative neurological ROS  negative psych ROS   GI/Hepatic negative GI ROS, Neg liver ROS, GERD  ,  Endo/Other  negative endocrine ROS  Renal/GU negative Renal ROS  negative genitourinary   Musculoskeletal   Abdominal   Peds  Hematology negative hematology ROS (+)   Anesthesia Other Findings Limited extension/flexion is Neck- s/p two separate c-spine surgeries, fusions Reports awareness under endoscopy 13 yrs ago- reported as "traumatic"  Disacussed we utilize propofol sedation- not general anesthesia so some awareness is possible.  Pt states understanding and consents. Dysphagia, worsening, likely dilitation today  Reproductive/Obstetrics negative OB ROS                             Anesthesia Physical Anesthesia Plan  ASA: III  Anesthesia Plan: MAC   Post-op Pain Management:    Induction:   PONV Risk Score and Plan:   Airway Management Planned:   Additional Equipment:   Intra-op Plan:   Post-operative Plan:   Informed Consent: I have reviewed the patients History and Physical, chart, labs and  discussed the procedure including the risks, benefits and alternatives for the proposed anesthesia with the patient or authorized representative who has indicated his/her understanding and acceptance.   Dental Advisory Given  Plan Discussed with: CRNA and Anesthesiologist  Anesthesia Plan Comments:         Anesthesia Quick Evaluation

## 2018-07-13 NOTE — Progress Notes (Signed)
Pt is aware and said to tell Dr. Oneida Alar "THANK YOU SO MUCH". Said she can already swallow better.

## 2018-07-14 ENCOUNTER — Encounter (HOSPITAL_COMMUNITY): Payer: Self-pay | Admitting: Gastroenterology

## 2018-07-14 NOTE — Progress Notes (Signed)
Patient scheduled.

## 2018-08-04 ENCOUNTER — Ambulatory Visit: Payer: Medicare Other | Admitting: Cardiovascular Disease

## 2018-08-04 ENCOUNTER — Encounter: Payer: Self-pay | Admitting: Cardiovascular Disease

## 2018-08-04 VITALS — BP 108/73 | HR 92 | Ht 67.5 in | Wt 178.8 lb

## 2018-08-04 DIAGNOSIS — R079 Chest pain, unspecified: Secondary | ICD-10-CM | POA: Diagnosis not present

## 2018-08-04 DIAGNOSIS — F439 Reaction to severe stress, unspecified: Secondary | ICD-10-CM

## 2018-08-04 DIAGNOSIS — R000 Tachycardia, unspecified: Secondary | ICD-10-CM

## 2018-08-04 DIAGNOSIS — R002 Palpitations: Secondary | ICD-10-CM | POA: Diagnosis not present

## 2018-08-04 DIAGNOSIS — F419 Anxiety disorder, unspecified: Secondary | ICD-10-CM

## 2018-08-04 MED ORDER — CITALOPRAM HYDROBROMIDE 20 MG PO TABS: 20.0000 mg | ORAL_TABLET | Freq: Two times a day (BID) | ORAL | 3 refills | Status: AC
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
## 2018-08-04 NOTE — Patient Instructions (Addendum)
Your physician wants you to follow-up in: 3 months with Dr.Koneswaran 11/08/18 at 3:40 pm   INCREASE Celexa to 20 mg twice a day, the second dose take just before bedtime    If you need a refill on your cardiac medications before your next appointment, please call your pharmacy.        No labs or tests ordered today.         Thank you for choosing [facility] !

## 2018-08-15 DIAGNOSIS — M2141 Flat foot [pes planus] (acquired), right foot: Secondary | ICD-10-CM | POA: Diagnosis not present

## 2018-08-15 DIAGNOSIS — M659 Synovitis and tenosynovitis, unspecified: Secondary | ICD-10-CM | POA: Diagnosis not present

## 2018-08-17 DIAGNOSIS — J069 Acute upper respiratory infection, unspecified: Secondary | ICD-10-CM | POA: Diagnosis not present

## 2018-08-30 DIAGNOSIS — M7671 Peroneal tendinitis, right leg: Secondary | ICD-10-CM | POA: Diagnosis not present

## 2018-09-04 ENCOUNTER — Ambulatory Visit (INDEPENDENT_AMBULATORY_CARE_PROVIDER_SITE_OTHER): Payer: Medicare Other | Admitting: Otolaryngology

## 2018-09-04 DIAGNOSIS — J0101 Acute recurrent maxillary sinusitis: Secondary | ICD-10-CM

## 2018-09-04 DIAGNOSIS — J310 Chronic rhinitis: Secondary | ICD-10-CM

## 2018-09-20 DIAGNOSIS — M7671 Peroneal tendinitis, right leg: Secondary | ICD-10-CM | POA: Diagnosis not present

## 2018-10-02 ENCOUNTER — Ambulatory Visit (INDEPENDENT_AMBULATORY_CARE_PROVIDER_SITE_OTHER): Payer: Medicare Other | Admitting: Otolaryngology

## 2018-10-11 ENCOUNTER — Telehealth: Payer: Self-pay | Admitting: *Deleted

## 2018-10-11 ENCOUNTER — Other Ambulatory Visit: Payer: Self-pay

## 2018-10-11 ENCOUNTER — Ambulatory Visit: Payer: Medicare Other | Admitting: Gastroenterology

## 2018-10-11 ENCOUNTER — Encounter: Payer: Self-pay | Admitting: Gastroenterology

## 2018-10-11 DIAGNOSIS — R1032 Left lower quadrant pain: Secondary | ICD-10-CM

## 2018-10-11 DIAGNOSIS — K582 Mixed irritable bowel syndrome: Secondary | ICD-10-CM | POA: Diagnosis not present

## 2018-10-11 DIAGNOSIS — M76821 Posterior tibial tendinitis, right leg: Secondary | ICD-10-CM | POA: Diagnosis not present

## 2018-10-11 DIAGNOSIS — K219 Gastro-esophageal reflux disease without esophagitis: Secondary | ICD-10-CM

## 2018-10-11 DIAGNOSIS — R1310 Dysphagia, unspecified: Secondary | ICD-10-CM | POA: Diagnosis not present

## 2018-10-11 DIAGNOSIS — R197 Diarrhea, unspecified: Secondary | ICD-10-CM

## 2018-10-11 DIAGNOSIS — R1319 Other dysphagia: Secondary | ICD-10-CM
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
## 2018-10-11 NOTE — Assessment & Plan Note (Addendum)
SYMPTOMS NOT IDEALLY CONTROLLED. DIFFERENTIAL DIAGNOSIS INCLUDES: IBS-D, diverticulitis, C DIFF COLITIS, OR LESS LIKELY IBD. LAST TCS 2014 FOR DIARRHEA-NL COLON Bx. NOW HAS SIGNIFICANT STRESSORS ND CONSUMES DAIRY REGULARLY. ON ABX IN PAST 4 MOS.  FOLLOW A LOW FODMAP DIET.  HANDOUT GIVEN. COMPLETE STOOL STUDIES. COMPLETE CT SCAN TODAY. DRINK WATER TO KEEP YOUR URINE LIGHT YELLOW. BENTYL 10 MG 2-3 TIMES A DAY TO CONTROL DIARRHEA/ABDOMINAL PAIN. IF CONSUMES DAIRY, ADD LACTASE 3 PILLS WITH MEALS UP TO THREE TIMES A DAY.  FOLLOW UP IN 6 MOS.

## 2018-10-11 NOTE — Telephone Encounter (Signed)
Patient left prior to CT being scheduled. CT scheduled for 11/22 at 9:30am, arrival time 9:15am, npo 4 hrs prior. P/u oral contrast today/tomorrow with instructions.   Called patient and she is aware of appt details.

## 2018-10-11 NOTE — Telephone Encounter (Signed)
[place] website and no PA is required for CT abd/pelvis.

## 2018-10-11 NOTE — Progress Notes (Signed)
ON RECALL  °

## 2018-10-11 NOTE — Assessment & Plan Note (Signed)
SYMPTOMS CONTROLLED/RESOLVED.  CONTINUE TO MONITOR SYMPTOMS. 

## 2018-10-11 NOTE — Progress Notes (Signed)
CC'D TO PCP °

## 2018-10-11 NOTE — Patient Instructions (Addendum)
COMPLETE STOOL STUDIES.  COMPLETE CT SCAN TODAY.  TO REDUCE ABDOMINAL PAIN AND HAVE LESS DIARRHEA:    1. DRINK WATER TO KEEP YOUR URINE LIGHT YELLOW.    2. FOLLOW A LOW FODMAP DIET. SEE HANDOUT.    3. CUT BENTYL IN HALF AND USE 2-3 TIMES A DAY BEFORE MEALS WHEN YOUR ABDOMINAL PAIN/DAIRRHEA ARE WORSE.    4. IF YOU CONSUME DAIRY, ADD LACTASE 3 PILLS WITH MEALS UP TO THREE TIMES A DAY.  CONTINUE PROTONIX. TAKE 30 MINUTES PRIOR TO MEALS TWICE DAILY.  FOLLOW UP IN 6 MOS.

## 2018-10-11 NOTE — Assessment & Plan Note (Signed)
SYMPTOMS CONTROLLED/RESOLVED.  CONTINUE PROTONIX. TAKE 30 MINUTES PRIOR TO MEALS TWICE DAILY. FOLLOW UP IN 6 MOS.  

## 2018-10-12 ENCOUNTER — Other Ambulatory Visit: Payer: Self-pay | Admitting: Gastroenterology

## 2018-10-12 DIAGNOSIS — R197 Diarrhea, unspecified: Secondary | ICD-10-CM | POA: Diagnosis not present

## 2018-10-12 DIAGNOSIS — R1032 Left lower quadrant pain: Secondary | ICD-10-CM | POA: Diagnosis not present

## 2018-10-13 ENCOUNTER — Ambulatory Visit (HOSPITAL_COMMUNITY)
Admission: RE | Admit: 2018-10-13 | Discharge: 2018-10-13 | Disposition: A | Discharge status: Home / Self Care | Payer: Medicare Other | Source: Ambulatory Visit | Attending: Gastroenterology | Admitting: Gastroenterology

## 2018-10-13 ENCOUNTER — Telehealth: Payer: Self-pay

## 2018-10-13 DIAGNOSIS — R197 Diarrhea, unspecified: Secondary | ICD-10-CM | POA: Diagnosis not present

## 2018-10-13 DIAGNOSIS — K5730 Diverticulosis of large intestine without perforation or abscess without bleeding: Secondary | ICD-10-CM | POA: Diagnosis not present

## 2018-10-13 DIAGNOSIS — R1032 Left lower quadrant pain: Secondary | ICD-10-CM | POA: Diagnosis not present

## 2018-10-13 LAB — CREATININE, SERUM
CREATININE: 0.75 mg/dL (ref 0.57–1.00)
GFR calc non Af Amer: 88 mL/min/{1.73_m2} (ref >59)
GFR, EST AFRICAN AMERICAN: 101 mL/min/{1.73_m2} (ref >59)

## 2018-10-13 LAB — POCT I-STAT CREATININE: Creatinine, Ser: 0.8 mg/dL (ref 0.44–1.00)

## 2018-10-13 MED ORDER — IOPAMIDOL (ISOVUE-300) INJECTION 61%
100.0000 mL | Freq: Once | INTRAVENOUS | Status: AC | PRN
  Administered 2018-10-13: 100 mL via INTRAVENOUS

## 2018-10-13 NOTE — Telephone Encounter (Signed)
REVIEWED-NO ADDITIONAL RECOMMENDATIONS. 

## 2018-10-13 NOTE — Telephone Encounter (Signed)
Received fax from [employer], not in chart yet. Creatinine is normal @ 0.75.

## 2018-10-16 NOTE — Progress Notes (Signed)
PT is aware.

## 2018-10-16 NOTE — Progress Notes (Signed)
LMOM to call.

## 2018-10-24 LAB — C DIFFICILE, CYTOTOXIN B

## 2018-10-24 LAB — C DIFFICILE TOXINS A+B W/RFLX: C difficile Toxins A+B, EIA: NEGATIVE

## 2018-11-08 ENCOUNTER — Encounter: Payer: Self-pay | Admitting: Cardiovascular Disease

## 2018-11-08 ENCOUNTER — Ambulatory Visit: Payer: Medicare Other | Admitting: Cardiovascular Disease

## 2018-11-08 VITALS — BP 110/68 | HR 95 | Ht 67.0 in | Wt 175.0 lb

## 2018-11-08 DIAGNOSIS — F419 Anxiety disorder, unspecified: Secondary | ICD-10-CM

## 2018-11-08 DIAGNOSIS — F439 Reaction to severe stress, unspecified: Secondary | ICD-10-CM | POA: Diagnosis not present

## 2018-11-08 DIAGNOSIS — R079 Chest pain, unspecified: Secondary | ICD-10-CM

## 2018-11-08 DIAGNOSIS — R000 Tachycardia, unspecified: Secondary | ICD-10-CM | POA: Diagnosis not present

## 2018-11-08 DIAGNOSIS — R002 Palpitations: Secondary | ICD-10-CM | POA: Diagnosis not present

## 2018-11-08 NOTE — Patient Instructions (Signed)

## 2018-11-08 NOTE — Progress Notes (Signed)
SUBJECTIVE: The patient presents for follow-up of chest pain, tachycardia, and palpitations.  She underwent a low risk nuclear stress test on 04/29/2017, LVEF 61%. There was a very small apical defect that appeared to be most consistent with variable soft tissue attenuation.  At her last visit, I increased citalopram to 10 mg twice daily due to elevated levels of anxiety and stress.  She follows with gastroenterology.  CT of the abdomen on 10/13/2018 showed colonic diverticulosis without acute inflammation.  She is doing well today.  She denies chest pain.  She said her heart rate sometimes gets up to 120 bpm but she thinks both the diltiazem and the citalopram have really helped her.  She has been very busy decorating and getting ready for Christmas.  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      Review of Systems: As per "subjective", otherwise negative.  Allergies  Allergen Reactions  . Penicillins Shortness Of Breath and Rash    Has patient had a PCN reaction causing immediate rash, facial/tongue/throat swelling, SOB or lightheadedness with hypotension: Yes Has patient had a PCN reaction causing severe rash involving mucus membranes or skin necrosis: No Has patient had a PCN reaction that required hospitalization Yes Has patient had a PCN reaction occurring within the last 10 years: No If all of the above answers are "NO", then may proceed with Cephalosporin use.   . Sulfamethoxazole-Trimethoprim Shortness Of Breath and Rash  . Adhesive [Tape]     Rips off skin, paper tape is ok  . Codeine     REACTION: gi upset, skin crawling  . Fentanyl     REACTION: skin crawling, nausea, passed out  . Morphine     REACTION: GI upset, skin crawling  . Nsaids     Upset stomach  . Influenza Vaccines Rash  . Latex  Rash    Current Outpatient Medications  Medication Sig Dispense Refill  . acetaminophen (TYLENOL) 650 MG CR tablet Take 1,300 mg by mouth 2 (two) times daily as needed for pain.    . citalopram (CELEXA) 20 MG tablet Take 1 tablet (20 mg total) by mouth 2 (two) times daily. (Patient taking differently: Take 10-20 mg by mouth 2 (two) times daily. Takes 10mg  in am and 20mg  in pm) 180 tablet 3  . diazepam (VALIUM) 10 MG tablet Take 10 mg by mouth every 8 (eight) hours as needed (muscle spasms).     . dicyclomine (BENTYL) 20 MG tablet Take 20 mg by mouth 4 (four) times daily as needed for spasms.    [name] diltiazem (CARDIZEM) 60 MG tablet Take 1 tablet (60 mg total) by mouth 2 (two) times daily. 180 tablet 3  . estradiol (ESTRACE) 0.5 MG tablet Take 0.5 mg by mouth at bedtime.     . fluticasone (FLONASE) 50 MCG/ACT nasal spray Place 1 spray into both nostrils 2 (two) times daily.    [name] ipratropium (ATROVENT) 0.06 % nasal spray Place 2 sprays into both nostrils daily.    . Multiple Vitamin (MULTIVITAMIN WITH MINERALS) TABS tablet Take 1 tablet by mouth daily.    . pantoprazole (PROTONIX) 40 MG tablet 1 PO 30 MINUTES PRIOR TO MEALS BID. (Patient taking differently: Take 40 mg by mouth 2 (two) times daily before a meal. ) 60  tablet 11   No current facility-administered medications for this visit.     Past Medical History:  Diagnosis Date  . Anxiety   . Arthritis   . B12 deficiency    h/o  . Family history of heart disease   . GERD (gastroesophageal reflux disease)   . Hypoglycemia   . IBS (irritable bowel syndrome)   . Lumbar disc disease   . OA (osteoarthritis)   . PONV (postoperative nausea and vomiting)    needs scop patch  . Rectocele   . Shingles   . Spinal stenosis in cervical region     Past Surgical History:  Procedure Laterality Date  . ABDOMINAL HYSTERECTOMY  1992  . ARTHRODESIS FOOT WITH WEIL OSTEOTOMY Left 05/09/2014   Procedure: LEFT FIRST TARSAL METATARSAL  ARTHRODESIS; LEFT  SECOND WEIL OSTEOTOMY;  Surgeon: Wylene Simmer, MD;  Location: [place];  Service: Orthopedics;  Laterality: Left;  . BIOPSY  07/11/2018   Procedure: BIOPSY;  Surgeon: Danie Binder, MD;  [facility];  Service: Endoscopy;;  duodenal biopsy , gastric biopsy   . BUNIONECTOMY WITH HAMMERTOE RECONSTRUCTION Left 05/09/2014   Procedure: LEFT MODIFIED MCBRIDE BUNIONECTOMY; LEFT SECOND HAMMER TOE CORRECTION;  Surgeon: Wylene Simmer, MD;  Location: [facility] [facility];  Service: Orthopedics;  Laterality: Left;  . CERVICAL SPINE SURGERY  2009  . CHOLECYSTECTOMY  2004   lap choli  . COLONOSCOPY  07/08/2003   RMR: Left-sided diverticula, remainder of colonic mucosa and terminal ileum appeared normal.. Minimally friable internal hemorrhoids, otherwise normal rectum  . COLONOSCOPY N/A 04/30/2013   Dr. Oneida Alar: mild diverticulosis in descending and sigmoid colon, internal hemorrhoids.   [name] DILATION AND CURETTAGE OF UTERUS    . ESOPHAGOGASTRODUODENOSCOPY (EGD) WITH PROPOFOL N/A 07/11/2018   Procedure: ESOPHAGOGASTRODUODENOSCOPY (EGD) WITH PROPOFOL;  Surgeon: Danie Binder, MD;  [facility];  Service: Endoscopy;  Laterality: N/A;  9:30am  . NM MYOCAR PERF WALL MOTION  10/2011   bruce myoview - breast attenuation noted in anterior region; EF 65%; no ischemia/infarct/scar; low risk  . SAVORY DILATION N/A 07/11/2018   Procedure: SAVORY DILATION;  Surgeon: Danie Binder, MD;  [facility];  Service: Endoscopy;  Laterality: N/A;  . TONSILLECTOMY  1980  . TUBAL LIGATION      Social History   Socioeconomic History  . Marital status: Married    Spouse name: Mikeal Hawthorne  . Number of children: 1  . Years of education: 49  . Highest education level: Not on file  Occupational History  . Occupation: Unemployed    Employer: [employer]    Employer: UNEMPLOYED  Social Needs  . Financial resource strain: Not on file  . Food insecurity:    Worry: Not on  file    Inability: Not on file  . Transportation needs:    Medical: Not on file    Non-medical: Not on file  Tobacco Use  . Smoking status: Former Smoker    Packs/day: 0.50    Years: 40.00    Pack years: 20.00    Types: Cigarettes    Last attempt to quit: 12/24/2013    Years since quitting: 4.8  . Smokeless tobacco: Never Used  . Tobacco comment: using vape as aid to sustain from smoking  Substance and Sexual Activity  . Alcohol use: No  . Drug use: No  . Sexual activity: Not on file  Lifestyle  . Physical activity:    Days per week: Not  on file    Minutes per session: Not on file  . Stress: Not on file  Relationships  . Social connections:    Talks on phone: Not on file    Gets together: Not on file    Attends religious service: Not on file    Active member of club or organization: Not on file    Attends meetings of clubs or organizations: Not on file    Relationship status: Not on file  . Intimate partner violence:    Fear of current or ex partner: Not on file    Emotionally abused: Not on file    Physically abused: Not on file    Forced sexual activity: Not on file  Other Topics Concern  . Not on file  Social History Narrative   One son, Down syndrome,is right handed ,resides with son and husband     Vitals:   11/08/18 1532  BP: 110/68  Pulse: 95  SpO2: 98%  Weight: 175 lb (79.4 kg)  Height: 5\' 7"  (1.702 m)    Wt Readings from Last 3 Encounters:  11/08/18 175 lb (79.4 kg)  10/11/18 172 lb (78 kg)  08/04/18 178 lb 12.8 oz (81.1 kg)     PHYSICAL EXAM General: NAD HEENT: Normal. Neck: No JVD, no thyromegaly. Lungs: Clear to auscultation bilaterally with normal respiratory effort. CV: Regular rate and rhythm, normal S1/S2, no S3/S4, no murmur. No pretibial or periankle edema.  No carotid bruit.   Abdomen: Soft, nontender, no distention.  Neurologic: Alert and oriented.  Psych: Normal affect. Skin: Normal. Musculoskeletal: No gross  deformities.    ECG: Reviewed above under Subjective   Labs: Lab Results  Component Value Date/Time   K 4.8 02/10/2018 10:15 AM   BUN 14 02/10/2018 10:15 AM   CREATININE 0.80 10/13/2018 09:47 AM   ALT 18 02/10/2018 10:15 AM   TSH 1.337 03/30/2017 03:51 PM   TSH 1.382 12/06/2008 12:00 AM   HGB 14.6 02/10/2018 10:15 AM     Lipids: Lab Results  Component Value Date/Time   LDLCALC 87 06/11/2009 11:47 PM   CHOL 195 06/11/2009 11:47 PM   TRIG 238 (H) 06/11/2009 11:47 PM   HDL 60 06/11/2009 11:47 PM       ASSESSMENT AND PLAN: 1.  Tachycardia and palpitations: Symptomatically improved.  I will continue diltiazem 60 mg twice daily and citalopram 10 mg twice daily.  2.  Chest pain: Symptomatically stable.  She underwent a low risk nuclear stress test on 04/29/2017, LVEF 61%. There was a very small apical defect that appeared to be's most consistent with variable soft tissue attenuation.  Some of her chest wall pains are musculoskeletal in etiology.  I will continue to monitor her symptoms.  3.  Anxiety and stress: Controlled on citalopram 10 mg twice daily.    Disposition: Follow up 6 months   Kate Sable, M.D., F.A.C.C.

## 2018-11-23 DIAGNOSIS — M7671 Peroneal tendinitis, right leg: Secondary | ICD-10-CM | POA: Diagnosis not present

## 2018-11-30 ENCOUNTER — Ambulatory Visit: Payer: Medicare Other | Admitting: Neurology

## 2018-12-19 DIAGNOSIS — M47816 Spondylosis without myelopathy or radiculopathy, lumbar region: Secondary | ICD-10-CM | POA: Diagnosis not present

## 2018-12-19 DIAGNOSIS — M47815 Spondylosis without myelopathy or radiculopathy, thoracolumbar region: Secondary | ICD-10-CM | POA: Diagnosis not present

## 2018-12-19 DIAGNOSIS — M4185 Other forms of scoliosis, thoracolumbar region: Secondary | ICD-10-CM | POA: Diagnosis not present

## 2019-01-05 DIAGNOSIS — M5416 Radiculopathy, lumbar region: Secondary | ICD-10-CM | POA: Diagnosis not present

## 2019-01-08 ENCOUNTER — Other Ambulatory Visit: Payer: Self-pay | Admitting: Internal Medicine

## 2019-01-08 DIAGNOSIS — Z1231 Encounter for screening mammogram for malignant neoplasm of breast: Secondary | ICD-10-CM

## 2019-01-11 ENCOUNTER — Other Ambulatory Visit: Payer: Self-pay | Admitting: Orthopedic Surgery

## 2019-01-11 DIAGNOSIS — M5416 Radiculopathy, lumbar region: Secondary | ICD-10-CM

## 2019-01-18 DIAGNOSIS — H02822 Cysts of right lower eyelid: Secondary | ICD-10-CM | POA: Diagnosis not present

## 2019-01-22 ENCOUNTER — Ambulatory Visit
Admission: RE | Admit: 2019-01-22 | Discharge: 2019-01-22 | Disposition: A | Discharge status: Home / Self Care | Payer: Medicare Other | Source: Ambulatory Visit | Attending: Orthopedic Surgery | Admitting: Orthopedic Surgery

## 2019-01-22 DIAGNOSIS — M545 Low back pain: Secondary | ICD-10-CM | POA: Diagnosis not present

## 2019-01-22 DIAGNOSIS — M5416 Radiculopathy, lumbar region: Secondary | ICD-10-CM

## 2019-01-31 ENCOUNTER — Ambulatory Visit
Admission: RE | Admit: 2019-01-31 | Discharge: 2019-01-31 | Disposition: A | Discharge status: Home / Self Care | Payer: Medicare Other | Source: Ambulatory Visit | Attending: Internal Medicine | Admitting: Internal Medicine

## 2019-01-31 ENCOUNTER — Other Ambulatory Visit: Payer: Self-pay

## 2019-01-31 DIAGNOSIS — Z1231 Encounter for screening mammogram for malignant neoplasm of breast: Secondary | ICD-10-CM | POA: Diagnosis not present

## 2019-02-24 ENCOUNTER — Other Ambulatory Visit: Payer: Self-pay | Admitting: Cardiovascular Disease

## 2019-03-03 ENCOUNTER — Other Ambulatory Visit: Payer: Self-pay | Admitting: Gastroenterology

## 2019-03-16 DIAGNOSIS — Z1389 Encounter for screening for other disorder: Secondary | ICD-10-CM | POA: Diagnosis not present

## 2019-03-16 DIAGNOSIS — K219 Gastro-esophageal reflux disease without esophagitis: Secondary | ICD-10-CM | POA: Diagnosis not present

## 2019-03-16 DIAGNOSIS — M1991 Primary osteoarthritis, unspecified site: Secondary | ICD-10-CM | POA: Diagnosis not present

## 2019-03-16 DIAGNOSIS — Z0001 Encounter for general adult medical examination with abnormal findings: Secondary | ICD-10-CM | POA: Diagnosis not present

## 2019-03-20 DIAGNOSIS — E039 Hypothyroidism, unspecified: Secondary | ICD-10-CM | POA: Diagnosis not present

## 2019-03-20 DIAGNOSIS — Z0001 Encounter for general adult medical examination with abnormal findings: Secondary | ICD-10-CM | POA: Diagnosis not present

## 2019-03-20 DIAGNOSIS — Z1389 Encounter for screening for other disorder: Secondary | ICD-10-CM | POA: Diagnosis not present

## 2019-03-27 ENCOUNTER — Encounter: Payer: Self-pay | Admitting: Gastroenterology

## 2019-04-26 ENCOUNTER — Ambulatory Visit: Payer: Medicare Other | Admitting: Gastroenterology

## 2019-04-26 ENCOUNTER — Other Ambulatory Visit: Payer: Self-pay

## 2019-04-26 ENCOUNTER — Encounter: Payer: Self-pay | Admitting: Gastroenterology

## 2019-04-26 DIAGNOSIS — R1310 Dysphagia, unspecified: Secondary | ICD-10-CM | POA: Diagnosis not present

## 2019-04-26 DIAGNOSIS — K219 Gastro-esophageal reflux disease without esophagitis: Secondary | ICD-10-CM | POA: Diagnosis not present

## 2019-04-26 DIAGNOSIS — R1319 Other dysphagia: Secondary | ICD-10-CM

## 2019-04-26 DIAGNOSIS — K582 Mixed irritable bowel syndrome: Secondary | ICD-10-CM

## 2019-04-26 NOTE — Patient Instructions (Addendum)
DRINK WATER TO KEEP YOUR URINE LIGHT YELLOW.  FOLLOW LOW FODMAP DIET. SEE HANDOUT.  COMPLETE BARIUM SWALLOW IF YOU'RE SWALLOWING CONTINUES TO BE A PROBLEM.  CONTINUE PROTONIX. TAKE 30 MINUTES PRIOR TO MEALS TWICE DAILY.  TAKE DICYCLOMINE TABLETS 30 MINUTES PRIOR MEALS UP TO THREE TIMES A DAY WHEN NEEDED TO CONTROL DIARRHEA/ABDOMINAL PAIN. IT MAY CAUSE DROWSINESS, DRY EYES/MOUTH, BLURRY VISION, OR DIFFICULTY URINATING.  FOLLOW UP IN 6 MOS.

## 2019-04-26 NOTE — Assessment & Plan Note (Signed)
SYMPTOMS CONTROLLED/RESOLVED.  CONTINUE PROTONIX. TAKE 30 MINUTES PRIOR TO MEALS TWICE DAILY. FOLLOW UP IN 6 MOS.  

## 2019-04-26 NOTE — Assessment & Plan Note (Signed)
SYMPTOMS FAIRLY WELL CONTROLLED ON PRN BENTYL.  DRINK WATER TO KEEP YOUR URINE LIGHT YELLOW. FOLLOW LOW FODMAP DIET. SEE HANDOUT. TAKE DICYCLOMINE TABLETS 30 MINUTES PRIOR MEALS UP TO THREE TIMES A DAY WHEN NEEDED TO CONTROL DIARRHEA/ABDOMINAL PAIN. IT MAY CAUSE DROWSINESS, DRY EYES/MOUTH, BLURRY VISION, OR DIFFICULTY URINATING. FOLLOW UP IN 6 MOS.

## 2019-04-26 NOTE — Assessment & Plan Note (Signed)
SYMPTOMS FAIRLY WELL CONTROLLED.  COMPLETE BARIUM SWALLOW IF YOU'RE SWALLOWING CONTINUES TO BE A PROBLEM. FOLLOW UP IN 6 MOS.

## 2019-04-26 NOTE — Progress Notes (Signed)
ON RECALL  °

## 2019-04-26 NOTE — Progress Notes (Signed)
Subjective:    Patient ID: Natasha Wright, female    DOB: [birth year], 58 y.o.   MRN: [id number]  Redmond School, MD  HPI SON HOSPITALIZED FOR 48 DAYS-ON VENT x3. 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 NOW HE'S GOOD BUT HAS A TRACH COMING OUT NEXT WEEK. WEIGHT DOWN 172 LBS AND NOW 169 LBS. HAVING LOTS OF GAS: BLOATING. TAKES GAS-X. NO SODAS OR FRUIT JUICES. MADE STIR FRY WITH BROCCOLI. BMs: BOTH CONSTIPATION/DIARRHEA, LAST NL BM COUPLE DAYS AGO. ABDOMINAL PAIN WITH GRUMBLING AND RUMBLING-5-7/10 AND MOSTLY 1-2/10. FEELS LIKE SHE MAY HAVE TO GO AND DOESN'T AND FEELS LIKE HAS TO PASS A BOWLING BALL. SWALLOWING ON AND OFF WITH SORE THROAT/SLEEPS WITH HER MOUTH OPEN.Natasha Wright DR. Benjamine Mola. HEARTBURN CONTROLLED. PROTONIX. PROBLEMS SWALLOWING WITH STEAK. LAST EGD/DIL AUG 2019. NECK BOTHERS HER FROM PREVIOUS NECK SURGERY. LAST BENTYL: YESTERDAY-COUPLE TIMES A MO. WALKING HELPS GAS PASS. ALSO HAS HICCUPS.   PT DENIES FEVER, CHILLS, HEMATOCHEZIA, HEMATEMESIS, nausea, vomiting, melena, CHEST PAIN, SHORTNESS OF BREATH, CHANGE IN BOWEL IN HABITS, OR heartburn or indigestion.  Past Medical History:  Diagnosis Date  . Anxiety   . Arthritis   . B12 deficiency    h/o  . Family history of heart disease   . GERD (gastroesophageal reflux disease)   . Hypoglycemia   . IBS (irritable bowel syndrome)   . Lumbar disc disease   . OA (osteoarthritis)   . PONV (postoperative nausea and vomiting)    needs scop patch  . Rectocele   . Shingles   . Spinal stenosis in cervical region    Past Surgical History:  Procedure Laterality Date  . ABDOMINAL HYSTERECTOMY  1992  . ARTHRODESIS FOOT WITH WEIL OSTEOTOMY Left 05/09/2014   Procedure: LEFT FIRST TARSAL METATARSAL  ARTHRODESIS; LEFT SECOND WEIL OSTEOTOMY;  Surgeon: Wylene Simmer, MD;  Location: [facility] [place];  Service: Orthopedics;  Laterality: Left;  . BIOPSY  07/11/2018   Procedure: BIOPSY;  Surgeon: Danie Binder, MD;  [facility];   Service: Endoscopy;;  duodenal biopsy , gastric biopsy   . BUNIONECTOMY WITH HAMMERTOE RECONSTRUCTION Left 05/09/2014   Procedure: LEFT MODIFIED MCBRIDE BUNIONECTOMY; LEFT SECOND HAMMER TOE CORRECTION;  Surgeon: Wylene Simmer, MD;  Location: [place];  Service: Orthopedics;  Laterality: Left;  . CERVICAL SPINE SURGERY  2009  . CHOLECYSTECTOMY  2004   lap choli  . COLONOSCOPY  07/08/2003   RMR: Left-sided diverticula, remainder of colonic mucosa and terminal ileum appeared normal.. Minimally friable internal hemorrhoids, otherwise normal rectum  . COLONOSCOPY N/A 04/30/2013   Dr. Oneida Alar: mild diverticulosis in descending and sigmoid colon, internal hemorrhoids.   [name] DILATION AND CURETTAGE OF UTERUS    . ESOPHAGOGASTRODUODENOSCOPY (EGD) WITH PROPOFOL N/A 07/11/2018   Procedure: ESOPHAGOGASTRODUODENOSCOPY (EGD) WITH PROPOFOL;  Surgeon: Danie Binder, MD;  [facility];  Service: Endoscopy;  Laterality: N/A;  9:30am  . NM MYOCAR PERF WALL MOTION  10/2011   bruce myoview - breast attenuation noted in anterior region; EF 65%; no ischemia/infarct/scar; low risk  . SAVORY DILATION N/A 07/11/2018   Procedure: SAVORY DILATION;  Surgeon: Danie Binder, MD;  [facility];  Service: Endoscopy;  Laterality: N/A;  . TONSILLECTOMY  1980  . TUBAL LIGATION     Allergies  Allergen Reactions  . Penicillins Shortness Of Breath and Rash      . Sulfamethoxazole-Trimethoprim Shortness Of Breath and Rash  . Adhesive [Tape]     Rips off skin,  paper tape is ok  . Codeine     REACTION: gi upset, skin crawling  . Fentanyl     REACTION: skin crawling, nausea, passed out  . Morphine     REACTION: GI upset, skin crawling  . Nsaids     Upset stomach  . Influenza Vaccines Rash  . Latex Rash    Current Outpatient Medications  Medication Sig    . acetaminophen (TYLENOL) 650 MG CR tablet Take 1,300 mg by mouth 2 (two) times daily as needed for pain.    . citalopram (CELEXA) 20  MG tablet Takes 10mg  in am and 20mg  in pm)    . diazepam (VALIUM) 10 MG tablet Take 10 mg by mouth every 8 (eight) hours as needed (muscle spasms).     . dicyclomine (BENTYL) 20 MG tablet Take 20 mg by mouth QID PRN for spasms.    [name] diltiazem (CARDIZEM) 60 MG tablet TAKE 1 TABLET BY MOUTH TWO  TIMES DAILY    . estradiol (ESTRACE) 0.5 MG tablet Take 0.5 mg by mouth at bedtime.     . fluticasone (FLONASE) 50 MCG/ACT nasal spray Place 1 spray into both nostrils 2 (two) times daily.    [name] ipratropium (ATROVENT) 0.06 % nasal spray Place 2 sprays into both nostrils daily.    . Multiple Vitamin (MULTIVITAMIN WITH MINERALS) TABS tablet Take 1 tablet by mouth daily.    . pantoprazole (PROTONIX) 40 MG tablet TAKE 1 TABLET QAC MEALS TWICE DAILY EVERY DAY    Review of Systems PER HPI OTHERWISE ALL SYSTEMS ARE NEGATIVE.    Objective:   Physical Exam Vitals signs reviewed.  Constitutional:      General: She is not in acute distress.    Appearance: She is well-developed.  HENT:     Head: Normocephalic and atraumatic.     Mouth/Throat:     Pharynx: No oropharyngeal exudate.     Comments: MASK IN PLACE Eyes:     General: No scleral icterus.    Pupils: Pupils are equal, round, and reactive to light.  Neck:     Musculoskeletal: Normal range of motion and neck supple.  Cardiovascular:     Rate and Rhythm: Normal rate and regular rhythm.     Heart sounds: Normal heart sounds.  Pulmonary:     Effort: Pulmonary effort is normal. No respiratory distress.     Breath sounds: Normal breath sounds.  Abdominal:     General: Bowel sounds are normal. There is no distension.     Palpations: Abdomen is soft.     Tenderness: There is no abdominal tenderness.  Lymphadenopathy:     Cervical: No cervical adenopathy.  Neurological:     Mental Status: She is alert and oriented to person, place, and time. Mental status is at baseline.  Psychiatric:        Behavior: Behavior normal.        Thought Content:  Thought content normal.     Comments: NORMAL AFFECT AND MOOD       Assessment & Plan:

## 2019-04-26 NOTE — Progress Notes (Signed)
CC'D TO PCP °

## 2019-05-10 DIAGNOSIS — H02822 Cysts of right lower eyelid: Secondary | ICD-10-CM | POA: Diagnosis not present

## 2019-05-10 DIAGNOSIS — H16223 Keratoconjunctivitis sicca, not specified as Sjogren's, bilateral: Secondary | ICD-10-CM | POA: Diagnosis not present

## 2019-05-10 DIAGNOSIS — H25093 Other age-related incipient cataract, bilateral: Secondary | ICD-10-CM | POA: Diagnosis not present

## 2019-05-17 ENCOUNTER — Ambulatory Visit (INDEPENDENT_AMBULATORY_CARE_PROVIDER_SITE_OTHER): Payer: Medicare Other | Admitting: Otolaryngology

## 2019-05-17 DIAGNOSIS — J310 Chronic rhinitis: Secondary | ICD-10-CM

## 2019-05-17 DIAGNOSIS — J343 Hypertrophy of nasal turbinates: Secondary | ICD-10-CM

## 2019-06-15 ENCOUNTER — Emergency Department (HOSPITAL_COMMUNITY): Payer: Medicare Other

## 2019-06-15 ENCOUNTER — Emergency Department (HOSPITAL_COMMUNITY)
Admission: EM | Admit: 2019-06-15 | Discharge: 2019-06-15 | Disposition: A | Discharge status: Home / Self Care | Payer: Medicare Other | Attending: Emergency Medicine | Admitting: Emergency Medicine

## 2019-06-15 ENCOUNTER — Other Ambulatory Visit: Payer: Self-pay

## 2019-06-15 ENCOUNTER — Encounter (HOSPITAL_COMMUNITY): Payer: Self-pay | Admitting: Emergency Medicine

## 2019-06-15 DIAGNOSIS — Z91040 Latex allergy status: Secondary | ICD-10-CM | POA: Insufficient documentation

## 2019-06-15 DIAGNOSIS — S01111A Laceration without foreign body of right eyelid and periocular area, initial encounter: Secondary | ICD-10-CM | POA: Diagnosis not present

## 2019-06-15 DIAGNOSIS — S0990XA Unspecified injury of head, initial encounter: Secondary | ICD-10-CM

## 2019-06-15 DIAGNOSIS — Y999 Unspecified external cause status: Secondary | ICD-10-CM | POA: Diagnosis not present

## 2019-06-15 DIAGNOSIS — W010XXA Fall on same level from slipping, tripping and stumbling without subsequent striking against object, initial encounter: Secondary | ICD-10-CM | POA: Insufficient documentation

## 2019-06-15 DIAGNOSIS — S0181XA Laceration without foreign body of other part of head, initial encounter: Secondary | ICD-10-CM

## 2019-06-15 DIAGNOSIS — Y9389 Activity, other specified: Secondary | ICD-10-CM | POA: Diagnosis not present

## 2019-06-15 DIAGNOSIS — Z79899 Other long term (current) drug therapy: Secondary | ICD-10-CM | POA: Insufficient documentation

## 2019-06-15 DIAGNOSIS — Y92094 Garage of other non-institutional residence as the place of occurrence of the external cause: Secondary | ICD-10-CM | POA: Insufficient documentation

## 2019-06-15 DIAGNOSIS — Z87891 Personal history of nicotine dependence: Secondary | ICD-10-CM | POA: Insufficient documentation

## 2019-06-15 MED ORDER — LIDOCAINE-EPINEPHRINE (PF) 2 %-1:200000 IJ SOLN
INTRAMUSCULAR | Status: AC
  Administered 2019-06-15: 20:00:00
  Filled 2019-06-15: qty 20

## 2019-06-15 NOTE — Discharge Instructions (Addendum)
Keep your wound clean and dry for at least the next several days.  The dermabond should flake away on its own.  Get rechecked for problems or concerns with this wound and refer to the minor head injury instructions as well.

## 2019-06-15 NOTE — ED Triage Notes (Addendum)
Pt tripped and fell and hit head on concrete. Pt unsure of LOC. Pt denies n/v or vision changes. Pt noted to have small laceration above RT eyebrow. Bleeding controlled at this time. Pt also c/o cramping to back of RT leg since fall.

## 2019-06-16 NOTE — ED Provider Notes (Signed)
[facility] EMERGENCY DEPARTMENT Provider Note   CSN: [id number] Arrival date & time: 06/15/19  1707     History   Chief Complaint Chief Complaint  Patient presents with  . Fall    HPI Natasha Wright is a 58 y.o. female with past medical history as outlined below presenting for evaluation of fall occurring at 4:30 today.  She describes slipping in her garage when trying to reach for a paint can, fell and struck her forehead on the floor.  She reports briefly feeling dazed, denies LOC and has had no dizziness, n/v, confusion or other complaints since the event.  She has sustained a small laceration above her right eyebrow.  She denies any new neck pain (has chronic cervical spinal stenosis). She initially had a cramp in her right hamstring area after the fall but has improved since arriving here.  She has had no tx prior to arrival. She is not on any anticoagulants. She is current with her tetanus vaccine.     The history is provided by the patient.    Past Medical History:  Diagnosis Date  . Anxiety   . Arthritis   . B12 deficiency    h/o  . Family history of heart disease   . GERD (gastroesophageal reflux disease)   . Hypoglycemia   . IBS (irritable bowel syndrome)   . Lumbar disc disease   . OA (osteoarthritis)   . PONV (postoperative nausea and vomiting)    needs scop patch  . Rectocele   . Shingles   . Spinal stenosis in cervical region     Patient Active Problem List   Diagnosis Date Noted  . Dysphagia   . Diarrhea   . Dyspepsia 06/08/2018  . Bilateral pes planus 05/17/2018  . Abdominal pain, left lateral 02/10/2018  . GERD (gastroesophageal reflux disease) 11/29/2016  . Nocturnal hypoxemia 03/01/2014  . Chest pain 03/01/2014  . DOE (dyspnea on exertion) 03/01/2014  . Abdominal bloating 04/18/2013  . UNSPECIFIED PERIPHERAL VASCULAR DISEASE 06/11/2009  . CARPAL TUNNEL SYNDROME, BILATERAL 12/20/2008  . NECK PAIN, CHRONIC 12/06/2008  . FATIGUE 12/06/2008   . VIRAL URI 08/09/2008  . HYPOGLYCEMIA 07/15/2008  . MACULAR DEGENERATION 07/15/2008  . Varicose veins of right lower extremity with complications 77/82/4235  . ALLERGIC RHINITIS 07/15/2008  . Constipation 07/15/2008  . IBS 07/15/2008  . OVERACTIVE BLADDER 07/15/2008  . ARTHRITIS 07/15/2008  . LOW BACK PAIN 07/15/2008  . FIBROMYALGIA 07/15/2008  . INSOMNIA 07/15/2008  . MIGRAINES, HX OF 07/15/2008    Past Surgical History:  Procedure Laterality Date  . ABDOMINAL HYSTERECTOMY  1992  . ARTHRODESIS FOOT WITH WEIL OSTEOTOMY Left 05/09/2014   Procedure: LEFT FIRST TARSAL METATARSAL  ARTHRODESIS; LEFT SECOND WEIL OSTEOTOMY;  Surgeon: Wylene Simmer, MD;  Location: [facility];  Service: Orthopedics;  Laterality: Left;  . BIOPSY  07/11/2018   Procedure: BIOPSY;  Surgeon: Danie Binder, MD;  [facility];  Service: Endoscopy;;  duodenal biopsy , gastric biopsy   . BUNIONECTOMY WITH HAMMERTOE RECONSTRUCTION Left 05/09/2014   Procedure: LEFT MODIFIED MCBRIDE BUNIONECTOMY; LEFT SECOND HAMMER TOE CORRECTION;  Surgeon: Wylene Simmer, MD;  Location: [facility];  Service: Orthopedics;  Laterality: Left;  . CERVICAL SPINE SURGERY  2009  . CHOLECYSTECTOMY  2004   lap choli  . COLONOSCOPY  07/08/2003   RMR: Left-sided diverticula, remainder of colonic mucosa and terminal ileum appeared normal.. Minimally friable internal hemorrhoids, otherwise normal rectum  . COLONOSCOPY N/A 04/30/2013  Dr. Oneida Alar: mild diverticulosis in descending and sigmoid colon, internal hemorrhoids.   [name] DILATION AND CURETTAGE OF UTERUS    . ESOPHAGOGASTRODUODENOSCOPY (EGD) WITH PROPOFOL N/A 07/11/2018   Procedure: ESOPHAGOGASTRODUODENOSCOPY (EGD) WITH PROPOFOL;  Surgeon: Danie Binder, MD;  [facility];  Service: Endoscopy;  Laterality: N/A;  9:30am  . NM MYOCAR PERF WALL MOTION  10/2011   bruce myoview - breast attenuation noted in anterior region; EF 65%; no  ischemia/infarct/scar; low risk  . SAVORY DILATION N/A 07/11/2018   Procedure: SAVORY DILATION;  Surgeon: Danie Binder, MD;  [facility];  Service: Endoscopy;  Laterality: N/A;  . TONSILLECTOMY  1980  . TUBAL LIGATION       OB History   No obstetric history on file.      Home Medications    Prior to Admission medications   Medication Sig Start Date End Date Taking? Authorizing Provider  acetaminophen (TYLENOL) 650 MG CR tablet Take 1,300 mg by mouth 2 (two) times daily as needed for pain.    [provider]  citalopram (CELEXA) 20 MG tablet Take 1 tablet (20 mg total) by mouth 2 (two) times daily. Patient taking differently: Take 10-20 mg by mouth 2 (two) times daily. Takes 10mg  in am and 20mg  in pm 08/04/18   Herminio Commons, MD  diazepam (VALIUM) 10 MG tablet Take 10 mg by mouth every 8 (eight) hours as needed (muscle spasms).     [provider]  dicyclomine (BENTYL) 20 MG tablet Take 20 mg by mouth 4 (four) times daily as needed for spasms.    [provider]  diltiazem (CARDIZEM) 60 MG tablet TAKE 1 TABLET BY MOUTH TWO  TIMES DAILY 02/26/19   Herminio Commons, MD  estradiol (ESTRACE) 0.5 MG tablet Take 0.5 mg by mouth at bedtime.     [provider]  fluticasone (FLONASE) 50 MCG/ACT nasal spray Place 1 spray into both nostrils 2 (two) times daily.    [provider]  ipratropium (ATROVENT) 0.06 % nasal spray Place 2 sprays into both nostrils daily.    [provider]  Multiple Vitamin (MULTIVITAMIN WITH MINERALS) TABS tablet Take 1 tablet by mouth daily.    [provider]  pantoprazole (PROTONIX) 40 MG tablet TAKE 1 TABLET BY MOUTH 30 MINUTES PRIOR TO MEALS TWICE DAILY 03/05/19   Mahala Menghini, PA-C    Family History Family History  Problem Relation Age of Onset  . Diabetes Mother   . Hypertension Mother   . Hyperlipidemia Mother   . Ulcerative colitis Father   . Breast cancer Maternal  Grandmother   . Hypertension Maternal Grandmother   . Diabetes Maternal Grandmother   . Stroke Maternal Grandmother   . Asthma Maternal Grandmother   . Stroke Maternal Grandfather   . Stroke Paternal Grandmother   . Diabetes Paternal Grandmother   . Heart disease Paternal Grandmother   . Heart disease Paternal Grandfather   . Hyperlipidemia Brother   . Hyperlipidemia Sister   . Hypertension Sister   . Diabetes Sister   . Down syndrome Child   . Heart disease Child   . Colon cancer Neg Hx     Social History Social History   Tobacco Use  . Smoking status: Former Smoker    Packs/day: 0.50    Years: 40.00    Pack years: 20.00    Types: Cigarettes    Quit date: 12/24/2013    Years since quitting: 5.4  .  Smokeless tobacco: Never Used  . Tobacco comment: using vape as aid to sustain from smoking  Substance Use Topics  . Alcohol use: No  . Drug use: No     Allergies   Penicillins, Sulfamethoxazole-trimethoprim, Adhesive [tape], Codeine, Fentanyl, Morphine, Nsaids, Influenza vaccines, and Latex   Review of Systems Review of Systems  Constitutional: Negative for fever.  HENT: Negative for congestion and sore throat.   Eyes: Negative.  Negative for visual disturbance.  Respiratory: Negative for chest tightness and shortness of breath.   Cardiovascular: Negative for chest pain.  Gastrointestinal: Negative for abdominal pain, nausea and vomiting.  Genitourinary: Negative.   Musculoskeletal: Positive for neck pain. Negative for arthralgias and joint swelling.       Neck pain chronic.  Skin: Positive for wound. Negative for rash.  Neurological: Negative for dizziness, weakness, light-headedness, numbness and headaches.  Psychiatric/Behavioral: Negative.      Physical Exam Updated Vital Signs BP 125/82   Pulse 83   Temp (!) 97.4 F (36.3 C) (Temporal)   Resp 16   Ht 5\' 8"  (1.727 m)   Wt 72.6 kg   SpO2 100%   BMI 24.33 kg/m   Physical Exam Vitals signs and  nursing note reviewed.  Constitutional:      Appearance: She is well-developed.  HENT:     Head: Normocephalic. No raccoon eyes or Battle's sign.     Jaw: There is normal jaw occlusion.     Comments: 1 cm subcutaneous laceration right forehead just superior to lateral brow edge, gaping.  Bleeding. No hematoma or palpable deformity.    Right Ear: No hemotympanum.     Left Ear: No hemotympanum.  Eyes:     Extraocular Movements: Extraocular movements intact.     Conjunctiva/sclera: Conjunctivae normal.     Pupils: Pupils are equal, round, and reactive to light.  Neck:     Musculoskeletal: Normal range of motion. No injury, pain with movement or spinous process tenderness.  Cardiovascular:     Rate and Rhythm: Normal rate and regular rhythm.  Pulmonary:     Effort: Pulmonary effort is normal.     Breath sounds: Normal breath sounds.  Musculoskeletal: Normal range of motion.        General: No swelling, tenderness or deformity.  Skin:    General: Skin is warm and dry.     Findings: Laceration present.  Neurological:     General: No focal deficit present.     Mental Status: She is alert and oriented to person, place, and time.     Cranial Nerves: No cranial nerve deficit.     Sensory: No sensory deficit.     Motor: No weakness.     Gait: Gait normal.      ED Treatments / Results  Labs (all labs ordered are listed, but only abnormal results are displayed) Labs Reviewed - No data to display  EKG None  Radiology No results found.  Procedures Procedures (including critical care time)  LACERATION REPAIR Performed by: Evalee Jefferson Authorized by: Evalee Jefferson Consent: Verbal consent obtained. Risks and benefits: risks, benefits and alternatives were discussed Consent given by: patient Patient identity confirmed: provided demographic data Prepped and Draped in normal sterile fashion Wound explored  Laceration Location: right brow  Laceration Length: 1cm  No Foreign  Bodies seen or palpated  Anesthesia: local infiltration  Local anesthetic: lidocaine 2% with epinephrine  Anesthetic total: 1 ml  Irrigation method: syringe Amount of cleaning: standard  Skin closure: subcutaneous  stitch x 1 using vicryl rapide 6-0, followed by dermabond for superficial closure  Number of sutures: 1  Technique: single deep cutaneous layer stitch  Patient tolerance: Patient tolerated the procedure well with no immediate complications.   Medications Ordered in ED Medications  lidocaine-EPINEPHrine (XYLOCAINE W/EPI) 2 %-1:200000 (PF) injection (  Given by Other 06/15/19 1943)     Initial Impression / Assessment and Plan / ED Course  I have reviewed the triage vital signs and the nursing notes.  Pertinent labs & imaging results that were available during my care of the patient were reviewed by me and considered in my medical decision making (see chart for details).        Discussed imaging given given nature of fall, although pt is without neuro signs or sx suggesting intracranial injury. Injury occurred 4 hours prior to dc with no symptom development.  Pt defers imaging, which is reasonable.  She was given strict return precautions and given info regarding minor head injury.  dermabond instructions also given. Prn f/u anticipated.  Final Clinical Impressions(s) / ED Diagnoses   Final diagnoses:  Minor head injury, initial encounter  Laceration of forehead, initial encounter    ED Discharge Orders    None       Landis Martins 06/16/19 1350    Julianne Rice, MD 06/16/19 2129

## 2019-06-18 ENCOUNTER — Ambulatory Visit
Admission: RE | Admit: 2019-06-18 | Discharge: 2019-06-18 | Disposition: A | Discharge status: Home / Self Care | Payer: Medicare Other | Source: Ambulatory Visit | Attending: Orthopedic Surgery | Admitting: Orthopedic Surgery

## 2019-06-18 ENCOUNTER — Other Ambulatory Visit: Payer: Self-pay | Admitting: Orthopedic Surgery

## 2019-06-18 DIAGNOSIS — M25552 Pain in left hip: Secondary | ICD-10-CM | POA: Diagnosis not present

## 2019-06-18 DIAGNOSIS — R519 Headache, unspecified: Secondary | ICD-10-CM

## 2019-06-18 DIAGNOSIS — M25551 Pain in right hip: Secondary | ICD-10-CM | POA: Diagnosis not present

## 2019-06-18 DIAGNOSIS — S0990XA Unspecified injury of head, initial encounter: Secondary | ICD-10-CM | POA: Diagnosis not present

## 2019-06-21 DIAGNOSIS — K5730 Diverticulosis of large intestine without perforation or abscess without bleeding: Secondary | ICD-10-CM | POA: Diagnosis not present

## 2019-06-21 DIAGNOSIS — S76311A Strain of muscle, fascia and tendon of the posterior muscle group at thigh level, right thigh, initial encounter: Secondary | ICD-10-CM | POA: Diagnosis not present

## 2019-06-21 DIAGNOSIS — S76811A Strain of other specified muscles, fascia and tendons at thigh level, right thigh, initial encounter: Secondary | ICD-10-CM | POA: Diagnosis not present

## 2019-06-21 DIAGNOSIS — M25551 Pain in right hip: Secondary | ICD-10-CM | POA: Diagnosis not present

## 2019-06-25 ENCOUNTER — Ambulatory Visit (INDEPENDENT_AMBULATORY_CARE_PROVIDER_SITE_OTHER): Payer: Medicare Other | Admitting: Otolaryngology

## 2019-06-25 DIAGNOSIS — S76319A Strain of muscle, fascia and tendon of the posterior muscle group at thigh level, unspecified thigh, initial encounter: Secondary | ICD-10-CM | POA: Diagnosis not present

## 2019-06-27 DIAGNOSIS — M6281 Muscle weakness (generalized): Secondary | ICD-10-CM | POA: Diagnosis not present

## 2019-06-27 DIAGNOSIS — S76311D Strain of muscle, fascia and tendon of the posterior muscle group at thigh level, right thigh, subsequent encounter: Secondary | ICD-10-CM | POA: Diagnosis not present

## 2019-06-27 DIAGNOSIS — M79604 Pain in right leg: Secondary | ICD-10-CM | POA: Diagnosis not present

## 2019-07-03 DIAGNOSIS — M79604 Pain in right leg: Secondary | ICD-10-CM | POA: Diagnosis not present

## 2019-07-03 DIAGNOSIS — S76311D Strain of muscle, fascia and tendon of the posterior muscle group at thigh level, right thigh, subsequent encounter: Secondary | ICD-10-CM | POA: Diagnosis not present

## 2019-07-03 DIAGNOSIS — M6281 Muscle weakness (generalized): Secondary | ICD-10-CM | POA: Diagnosis not present

## 2019-07-05 DIAGNOSIS — M79604 Pain in right leg: Secondary | ICD-10-CM | POA: Diagnosis not present

## 2019-07-05 DIAGNOSIS — S76311D Strain of muscle, fascia and tendon of the posterior muscle group at thigh level, right thigh, subsequent encounter: Secondary | ICD-10-CM | POA: Diagnosis not present

## 2019-07-05 DIAGNOSIS — M6281 Muscle weakness (generalized): Secondary | ICD-10-CM | POA: Diagnosis not present

## 2019-07-09 DIAGNOSIS — S76319A Strain of muscle, fascia and tendon of the posterior muscle group at thigh level, unspecified thigh, initial encounter: Secondary | ICD-10-CM | POA: Diagnosis not present

## 2019-07-10 DIAGNOSIS — S76311D Strain of muscle, fascia and tendon of the posterior muscle group at thigh level, right thigh, subsequent encounter: Secondary | ICD-10-CM | POA: Diagnosis not present

## 2019-07-10 DIAGNOSIS — M6281 Muscle weakness (generalized): Secondary | ICD-10-CM | POA: Diagnosis not present

## 2019-07-10 DIAGNOSIS — M79604 Pain in right leg: Secondary | ICD-10-CM | POA: Diagnosis not present

## 2019-07-12 DIAGNOSIS — M6281 Muscle weakness (generalized): Secondary | ICD-10-CM | POA: Diagnosis not present

## 2019-07-12 DIAGNOSIS — S76311D Strain of muscle, fascia and tendon of the posterior muscle group at thigh level, right thigh, subsequent encounter: Secondary | ICD-10-CM | POA: Diagnosis not present

## 2019-07-12 DIAGNOSIS — M79604 Pain in right leg: Secondary | ICD-10-CM | POA: Diagnosis not present

## 2019-07-16 DIAGNOSIS — M6281 Muscle weakness (generalized): Secondary | ICD-10-CM | POA: Diagnosis not present

## 2019-07-16 DIAGNOSIS — M79604 Pain in right leg: Secondary | ICD-10-CM | POA: Diagnosis not present

## 2019-07-16 DIAGNOSIS — S76311D Strain of muscle, fascia and tendon of the posterior muscle group at thigh level, right thigh, subsequent encounter: Secondary | ICD-10-CM | POA: Diagnosis not present

## 2019-07-17 ENCOUNTER — Other Ambulatory Visit: Payer: Self-pay | Admitting: Orthopaedic Surgery

## 2019-07-17 DIAGNOSIS — M79661 Pain in right lower leg: Secondary | ICD-10-CM

## 2019-07-18 DIAGNOSIS — S76311D Strain of muscle, fascia and tendon of the posterior muscle group at thigh level, right thigh, subsequent encounter: Secondary | ICD-10-CM | POA: Diagnosis not present

## 2019-07-18 DIAGNOSIS — M79604 Pain in right leg: Secondary | ICD-10-CM | POA: Diagnosis not present

## 2019-07-18 DIAGNOSIS — M6281 Muscle weakness (generalized): Secondary | ICD-10-CM | POA: Diagnosis not present

## 2019-07-23 DIAGNOSIS — S76311D Strain of muscle, fascia and tendon of the posterior muscle group at thigh level, right thigh, subsequent encounter: Secondary | ICD-10-CM | POA: Diagnosis not present

## 2019-07-23 DIAGNOSIS — M6281 Muscle weakness (generalized): Secondary | ICD-10-CM | POA: Diagnosis not present

## 2019-07-23 DIAGNOSIS — M79604 Pain in right leg: Secondary | ICD-10-CM | POA: Diagnosis not present

## 2019-07-25 DIAGNOSIS — J343 Hypertrophy of nasal turbinates: Secondary | ICD-10-CM | POA: Diagnosis not present

## 2019-07-25 DIAGNOSIS — M79604 Pain in right leg: Secondary | ICD-10-CM | POA: Diagnosis not present

## 2019-07-25 DIAGNOSIS — M6281 Muscle weakness (generalized): Secondary | ICD-10-CM | POA: Diagnosis not present

## 2019-07-25 DIAGNOSIS — S76311D Strain of muscle, fascia and tendon of the posterior muscle group at thigh level, right thigh, subsequent encounter: Secondary | ICD-10-CM | POA: Diagnosis not present

## 2019-07-25 DIAGNOSIS — J310 Chronic rhinitis: Secondary | ICD-10-CM | POA: Diagnosis not present

## 2019-07-26 ENCOUNTER — Other Ambulatory Visit: Payer: Self-pay

## 2019-07-26 ENCOUNTER — Ambulatory Visit
Admission: RE | Admit: 2019-07-26 | Discharge: 2019-07-26 | Disposition: A | Discharge status: Home / Self Care | Payer: Medicare Other | Source: Ambulatory Visit | Attending: Orthopaedic Surgery | Admitting: Orthopaedic Surgery

## 2019-07-26 DIAGNOSIS — M79661 Pain in right lower leg: Secondary | ICD-10-CM

## 2019-07-26 DIAGNOSIS — M25471 Effusion, right ankle: Secondary | ICD-10-CM | POA: Diagnosis not present

## 2019-07-26 DIAGNOSIS — R600 Localized edema: Secondary | ICD-10-CM | POA: Diagnosis not present

## 2019-08-01 DIAGNOSIS — S76319A Strain of muscle, fascia and tendon of the posterior muscle group at thigh level, unspecified thigh, initial encounter: Secondary | ICD-10-CM | POA: Diagnosis not present

## 2019-08-01 DIAGNOSIS — M7671 Peroneal tendinitis, right leg: Secondary | ICD-10-CM | POA: Diagnosis not present

## 2019-08-01 DIAGNOSIS — M25561 Pain in right knee: Secondary | ICD-10-CM | POA: Diagnosis not present

## 2019-08-08 ENCOUNTER — Telehealth: Payer: Self-pay | Admitting: Cardiovascular Disease

## 2019-08-08 DIAGNOSIS — M25571 Pain in right ankle and joints of right foot: Secondary | ICD-10-CM | POA: Diagnosis not present

## 2019-08-08 DIAGNOSIS — M79672 Pain in left foot: Secondary | ICD-10-CM | POA: Diagnosis not present

## 2019-08-08 DIAGNOSIS — M2011 Hallux valgus (acquired), right foot: Secondary | ICD-10-CM | POA: Diagnosis not present

## 2019-08-08 DIAGNOSIS — M79671 Pain in right foot: Secondary | ICD-10-CM | POA: Diagnosis not present

## 2019-08-08 NOTE — Telephone Encounter (Signed)
Virtual Visit Pre-Appointment Phone Call  "(Name), I am calling you today to discuss your upcoming appointment. We are currently trying to limit exposure to the virus that causes COVID-19 by seeing patients at home rather than in the office."  1. "What is the BEST phone number to call the day of the visit?" - include this in appointment notes  2. Do you have or have access to (through a family member/friend) a smartphone with video capability that we can use for your visit?" a. If yes - list this number in appt notes as cell (if different from BEST phone #) and list the appointment type as a VIDEO visit in appointment notes b. If no - list the appointment type as a PHONE visit in appointment notes  3. Confirm consent - "In the setting of the current Covid19 crisis, you are scheduled for a (phone or video) visit with your provider on (date) at (time).  Just as we do with many in-office visits, in order for you to participate in this visit, we must obtain consent.  If you'd like, I can send this to your mychart (if signed up) or email for you to review.  Otherwise, I can obtain your verbal consent now.  All virtual visits are billed to your insurance company just like a normal visit would be.  By agreeing to a virtual visit, we'd like you to understand that the technology does not allow for your provider to perform an examination, and thus may limit your provider's ability to fully assess your condition. If your provider identifies any concerns that need to be evaluated in person, we will make arrangements to do so.  Finally, though the technology is pretty good, we cannot assure that it will always work on either your or our end, and in the setting of a video visit, we may have to convert it to a phone-only visit.  In either situation, we cannot ensure that we have a secure connection.  Are you willing to proceed?" STAFF: Did the patient verbally acknowledge consent to telehealth visit? Document  YES/NO here: Yes  4. Advise patient to be prepared - "Two hours prior to your appointment, go ahead and check your blood pressure, pulse, oxygen saturation, and your weight (if you have the equipment to check those) and write them all down. When your visit starts, your provider will ask you for this information. If you have an Apple Watch or Kardia device, please plan to have heart rate information ready on the day of your appointment. Please have a pen and paper handy nearby the day of the visit as well."  5. Give patient instructions for MyChart download to smartphone OR [email or web address] as below if video visit (depending on what platform provider is using)  6. Inform patient they will receive a phone call 15 minutes prior to their appointment time (may be from unknown caller ID) so they should be prepared to answer    TELEPHONE CALL NOTE  Natasha Wright has been deemed a candidate for a follow-up tele-health visit to limit community exposure during the Covid-19 pandemic. I spoke with the patient via phone to ensure availability of phone/video source, confirm preferred email & phone number, and discuss instructions and expectations.  I reminded Natasha Wright to be prepared with any vital sign and/or heart rhythm information that could potentially be obtained via home monitoring, at the time of her visit. I reminded Natasha Wright to expect a phone call prior to her visit.  [name] 08/08/2019 3:29 PM

## 2019-08-09 ENCOUNTER — Encounter: Payer: Self-pay | Admitting: Cardiovascular Disease

## 2019-08-09 ENCOUNTER — Telehealth (INDEPENDENT_AMBULATORY_CARE_PROVIDER_SITE_OTHER): Payer: Medicare Other | Admitting: Cardiovascular Disease

## 2019-08-09 ENCOUNTER — Other Ambulatory Visit: Payer: Self-pay

## 2019-08-09 VITALS — BP 100/70 | HR 110 | Ht 68.0 in | Wt 155.0 lb

## 2019-08-09 DIAGNOSIS — R002 Palpitations: Secondary | ICD-10-CM | POA: Diagnosis not present

## 2019-08-09 DIAGNOSIS — G894 Chronic pain syndrome: Secondary | ICD-10-CM

## 2019-08-09 DIAGNOSIS — R000 Tachycardia, unspecified: Secondary | ICD-10-CM

## 2019-08-09 DIAGNOSIS — F439 Reaction to severe stress, unspecified: Secondary | ICD-10-CM

## 2019-08-09 DIAGNOSIS — F419 Anxiety disorder, unspecified: Secondary | ICD-10-CM

## 2019-08-09 DIAGNOSIS — R079 Chest pain, unspecified: Secondary | ICD-10-CM

## 2019-08-09 NOTE — Patient Instructions (Signed)
Medication Instructions:   Your physician recommends that you continue on your current medications as directed. Please refer to the Current Medication list given to you today.  Labwork:  NONE  Testing/Procedures:  NONE  Follow-Up:  Your physician recommends that you schedule a follow-up appointment in: 3 months.  Any Other Special Instructions Will Be Listed Below (If Applicable).  If you need a refill on your cardiac medications before your next appointment, please call your pharmacy. 

## 2019-08-09 NOTE — Progress Notes (Signed)
Virtual Visit via Telephone Note   This visit type was conducted due to national recommendations for restrictions regarding the COVID-19 Pandemic (e.g. social distancing) in an effort to limit this patient's exposure and mitigate transmission in our community.  Due to her co-morbid illnesses, this patient is at least at moderate risk for complications without adequate follow up.  This format is felt to be most appropriate for this patient at this time.  The patient did not have access to video technology/had technical difficulties with video requiring transitioning to audio format only (telephone).  All issues noted in this document were discussed and addressed.  No physical exam could be performed with this format.  Please refer to the patient's chart for her  consent to telehealth for [facility].   Date:  08/09/2019   ID:  Natasha Wright, DOB 01-May-1961, MRN [id number]  Patient Location: Home Provider Location: Office  PCP:  Redmond School, MD  Cardiologist:  Kate Sable, MD  Electrophysiologist:  None   Evaluation Performed:  Follow-Up Visit  Chief Complaint:  Palpitations  History of Present Illness:    Natasha Wright is a 58 y.o. female with a history of chest pain, tachycardia, and palpitations.  She underwent a low risk nuclear stress test on 04/29/2017, LVEF 61%. There was a very small apical defect that appeared to be most consistent with variable soft tissue attenuation.  Her son was hospitalized with ARDS/pneumonia in February due to a viral infection (not COVID). He was hospitalized for 48 days. He received a tracheostomy. He is doing well now.  She lost 40 lbs and is now up to 155 lbs.  Her HR has been erratic due to this. Her HR ranges from 90-130 bpm. This leads to chest tightness. She attributes this to anxiety.   Her PCP tried Xanax but she didn't like it.  She slipped on some paint and fell and hit her head/face in the garage and sustained  lacerations. She then had leg cramps.  Head CT was normal on 06/18/19.  The patient does not have symptoms concerning for COVID-19 infection (fever, chills, cough, or new shortness of breath).   Soc Hx: Her husband is retired from the [employer]. This is her third marriage. They have been married x 16 yrs.   Past Medical History:  Diagnosis Date  . Anxiety   . Arthritis   . B12 deficiency    h/o  . Family history of heart disease   . GERD (gastroesophageal reflux disease)   . Hypoglycemia   . IBS (irritable bowel syndrome)   . Lumbar disc disease   . OA (osteoarthritis)   . PONV (postoperative nausea and vomiting)    needs scop patch  . Rectocele   . Shingles   . Spinal stenosis in cervical region    Past Surgical History:  Procedure Laterality Date  . ABDOMINAL HYSTERECTOMY  1992  . ARTHRODESIS FOOT WITH WEIL OSTEOTOMY Left 05/09/2014   Procedure: LEFT FIRST TARSAL METATARSAL  ARTHRODESIS; LEFT SECOND WEIL OSTEOTOMY;  Surgeon: Wylene Simmer, MD;  Location: [facility];  Service: Orthopedics;  Laterality: Left;  . BIOPSY  07/11/2018   Procedure: BIOPSY;  Surgeon: Danie Binder, MD;  [facility];  Service: Endoscopy;;  duodenal biopsy , gastric biopsy   . BUNIONECTOMY WITH HAMMERTOE RECONSTRUCTION Left 05/09/2014   Procedure: LEFT MODIFIED MCBRIDE BUNIONECTOMY; LEFT SECOND HAMMER TOE CORRECTION;  Surgeon: Wylene Simmer, MD;  Location: [facility];  Service:  Orthopedics;  Laterality: Left;  . CERVICAL SPINE SURGERY  2009  . CHOLECYSTECTOMY  2004   lap choli  . COLONOSCOPY  07/08/2003   RMR: Left-sided diverticula, remainder of colonic mucosa and terminal ileum appeared normal.. Minimally friable internal hemorrhoids, otherwise normal rectum  . COLONOSCOPY N/A 04/30/2013   Dr. Oneida Alar: mild diverticulosis in descending and sigmoid colon, internal hemorrhoids.   [name] DILATION AND CURETTAGE OF UTERUS    . ESOPHAGOGASTRODUODENOSCOPY (EGD)  WITH PROPOFOL N/A 07/11/2018   Procedure: ESOPHAGOGASTRODUODENOSCOPY (EGD) WITH PROPOFOL;  Surgeon: Danie Binder, MD;  [facility];  Service: Endoscopy;  Laterality: N/A;  9:30am  . NM MYOCAR PERF WALL MOTION  10/2011   bruce myoview - breast attenuation noted in anterior region; EF 65%; no ischemia/infarct/scar; low risk  . SAVORY DILATION N/A 07/11/2018   Procedure: SAVORY DILATION;  Surgeon: Danie Binder, MD;  [facility];  Service: Endoscopy;  Laterality: N/A;  . TONSILLECTOMY  1980  . TUBAL LIGATION       Current Meds  Medication Sig  . acetaminophen (TYLENOL) 650 MG CR tablet Take 1,300 mg by mouth 2 (two) times daily as needed for pain.  . citalopram (CELEXA) 20 MG tablet Take 1 tablet (20 mg total) by mouth 2 (two) times daily. (Patient taking differently: Take 10-20 mg by mouth 2 (two) times daily. Takes 10mg  in am and 20mg  in pm)  . diazepam (VALIUM) 10 MG tablet Take 10 mg by mouth every 8 (eight) hours as needed (muscle spasms).   . dicyclomine (BENTYL) 20 MG tablet Take 20 mg by mouth 4 (four) times daily as needed for spasms.  [name] diltiazem (CARDIZEM) 60 MG tablet TAKE 1 TABLET BY MOUTH TWO  TIMES DAILY  . estradiol (ESTRACE) 0.5 MG tablet Take 0.5 mg by mouth at bedtime.   . fluticasone (FLONASE) 50 MCG/ACT nasal spray Place 1 spray into both nostrils 2 (two) times daily.  [name] ipratropium (ATROVENT) 0.06 % nasal spray Place 2 sprays into both nostrils daily.  . Multiple Vitamin (MULTIVITAMIN WITH MINERALS) TABS tablet Take 1 tablet by mouth daily.  . pantoprazole (PROTONIX) 40 MG tablet TAKE 1 TABLET BY MOUTH 30 MINUTES PRIOR TO MEALS TWICE DAILY     Allergies:   Penicillins, Sulfamethoxazole-trimethoprim, Adhesive [tape], Codeine, Fentanyl, Morphine, Nsaids, Influenza vaccines, and Latex   Social History   Tobacco Use  . Smoking status: Former Smoker    Packs/day: 0.50    Years: 40.00    Pack years: 20.00    Types: Cigarettes    Quit date:  12/24/2013    Years since quitting: 5.6  . Smokeless tobacco: Never Used  . Tobacco comment: using vape as aid to sustain from smoking  Substance Use Topics  . Alcohol use: No  . Drug use: No     Family Hx: The patient's family history includes Asthma in her maternal grandmother; Breast cancer in her maternal grandmother; Diabetes in her maternal grandmother, mother, paternal grandmother, and sister; Down syndrome in her child; Heart disease in her child, paternal grandfather, and paternal grandmother; Hyperlipidemia in her brother, mother, and sister; Hypertension in her maternal grandmother, mother, and sister; Stroke in her maternal grandfather, maternal grandmother, and paternal grandmother; Ulcerative colitis in her father. There is no history of Colon cancer.  ROS:   Please see the history of present illness.     All other systems reviewed and are negative.   Prior CV studies:   The following studies were reviewed  today:  Echo 04/11/17:  Study Conclusions  - Left ventricle: The cavity size was normal. Wall thickness was   increased in a pattern of mild LVH. Systolic function was normal.   The estimated ejection fraction was in the range of 55% to 60%.   Wall motion was normal; there were no regional wall motion   abnormalities. There was a borderline abnormality in the ratio of   early to atrial left ventricular filling.  Labs/Other Tests and Data Reviewed:    EKG:  No ECG reviewed.  Recent Labs: 10/13/2018: Creatinine, Ser 0.80    Wt Readings from Last 3 Encounters:  08/09/19 155 lb (70.3 kg)  06/15/19 160 lb (72.6 kg)  04/26/19 169 lb 6.4 oz (76.8 kg)     Objective:    Vital Signs:  BP 100/70   Pulse (!) 110   Ht 5\' 8"  (1.727 m)   Wt 155 lb (70.3 kg)   SpO2 98%   BMI 23.57 kg/m    VITAL SIGNS:  reviewed  ASSESSMENT & PLAN:    1. Tachycardia and palpitations: Symptomatically stable. I will continue diltiazem 60 mg twice daily and citalopram 10 mg q  am and 20 mg q pm.  2. Chest pain: Symptomatically stable.  She underwent a low risk nuclear stress test on 04/29/2017, LVEF 61%. There was a very small apical defect that appeared to be's most consistent with variable soft tissue attenuation.Some of her chest wall pains are musculoskeletal in etiology. I will continue to monitor her symptoms.  3. Anxiety and stress: Controlled on citalopram 10 mg q am and 20 mg q pm.   COVID-19 Education: The signs and symptoms of COVID-19 were discussed with the patient and how to seek care for testing (follow up with PCP or arrange E-visit).  The importance of social distancing was discussed today.  Time:   Today, I have spent 25 minutes with the patient with telehealth technology discussing the above problems.     Medication Adjustments/Labs and Tests Ordered: Current medicines are reviewed at length with the patient today.  Concerns regarding medicines are outlined above.   Tests Ordered: No orders of the defined types were placed in this encounter.   Medication Changes: No orders of the defined types were placed in this encounter.   Follow Up:  In Person in 3 month(s)  Signed, Kate Sable, MD  08/09/2019 10:00 AM    [place]

## 2019-08-11 ENCOUNTER — Other Ambulatory Visit: Payer: Medicare Other

## 2019-08-13 DIAGNOSIS — H25093 Other age-related incipient cataract, bilateral: Secondary | ICD-10-CM | POA: Diagnosis not present

## 2019-08-13 DIAGNOSIS — H16223 Keratoconjunctivitis sicca, not specified as Sjogren's, bilateral: Secondary | ICD-10-CM | POA: Diagnosis not present

## 2019-08-13 DIAGNOSIS — H02822 Cysts of right lower eyelid: Secondary | ICD-10-CM | POA: Diagnosis not present

## 2019-08-28 DIAGNOSIS — M1991 Primary osteoarthritis, unspecified site: Secondary | ICD-10-CM | POA: Diagnosis not present

## 2019-08-28 DIAGNOSIS — K219 Gastro-esophageal reflux disease without esophagitis: Secondary | ICD-10-CM | POA: Diagnosis not present

## 2019-08-30 DIAGNOSIS — H16223 Keratoconjunctivitis sicca, not specified as Sjogren's, bilateral: Secondary | ICD-10-CM | POA: Diagnosis not present

## 2019-08-31 ENCOUNTER — Other Ambulatory Visit: Payer: Self-pay

## 2019-08-31 DIAGNOSIS — Z20822 Contact with and (suspected) exposure to covid-19: Secondary | ICD-10-CM

## 2019-09-01 LAB — NOVEL CORONAVIRUS, NAA: SARS-CoV-2, NAA: NOT DETECTED

## 2019-09-07 DIAGNOSIS — Z1159 Encounter for screening for other viral diseases: Secondary | ICD-10-CM | POA: Diagnosis not present

## 2019-09-10 DIAGNOSIS — M25561 Pain in right knee: Secondary | ICD-10-CM | POA: Diagnosis not present

## 2019-09-10 DIAGNOSIS — M25571 Pain in right ankle and joints of right foot: Secondary | ICD-10-CM | POA: Diagnosis not present

## 2019-09-13 DIAGNOSIS — J343 Hypertrophy of nasal turbinates: Secondary | ICD-10-CM | POA: Diagnosis not present

## 2019-09-13 DIAGNOSIS — J3489 Other specified disorders of nose and nasal sinuses: Secondary | ICD-10-CM | POA: Diagnosis not present

## 2019-09-20 ENCOUNTER — Other Ambulatory Visit: Payer: Self-pay | Admitting: Cardiovascular Disease

## 2019-09-27 ENCOUNTER — Encounter: Payer: Self-pay | Admitting: Gastroenterology

## 2019-10-04 DIAGNOSIS — H25093 Other age-related incipient cataract, bilateral: Secondary | ICD-10-CM | POA: Diagnosis not present

## 2019-10-04 DIAGNOSIS — H02822 Cysts of right lower eyelid: Secondary | ICD-10-CM | POA: Diagnosis not present

## 2019-10-04 DIAGNOSIS — H16223 Keratoconjunctivitis sicca, not specified as Sjogren's, bilateral: Secondary | ICD-10-CM | POA: Diagnosis not present

## 2019-10-05 DIAGNOSIS — M25571 Pain in right ankle and joints of right foot: Secondary | ICD-10-CM | POA: Diagnosis not present

## 2019-10-11 ENCOUNTER — Other Ambulatory Visit: Payer: Self-pay

## 2019-10-11 ENCOUNTER — Ambulatory Visit (INDEPENDENT_AMBULATORY_CARE_PROVIDER_SITE_OTHER): Payer: Medicare Other | Admitting: Otolaryngology

## 2019-10-15 DIAGNOSIS — M25571 Pain in right ankle and joints of right foot: Secondary | ICD-10-CM | POA: Diagnosis not present

## 2019-11-12 ENCOUNTER — Ambulatory Visit: Payer: Medicare Other | Admitting: Cardiovascular Disease

## 2019-11-19 DIAGNOSIS — L820 Inflamed seborrheic keratosis: Secondary | ICD-10-CM | POA: Diagnosis not present

## 2019-11-19 DIAGNOSIS — L304 Erythema intertrigo: Secondary | ICD-10-CM | POA: Diagnosis not present

## 2019-11-22 DIAGNOSIS — M1991 Primary osteoarthritis, unspecified site: Secondary | ICD-10-CM | POA: Diagnosis not present

## 2019-11-22 DIAGNOSIS — G894 Chronic pain syndrome: Secondary | ICD-10-CM | POA: Diagnosis not present

## 2019-12-10 ENCOUNTER — Other Ambulatory Visit: Payer: Self-pay

## 2019-12-10 ENCOUNTER — Ambulatory Visit: Payer: Medicare Other | Attending: Internal Medicine

## 2019-12-10 DIAGNOSIS — Z20822 Contact with and (suspected) exposure to covid-19: Secondary | ICD-10-CM | POA: Diagnosis not present

## 2019-12-11 LAB — NOVEL CORONAVIRUS, NAA: SARS-CoV-2, NAA: NOT DETECTED

## 2019-12-23 DIAGNOSIS — G894 Chronic pain syndrome: Secondary | ICD-10-CM | POA: Diagnosis not present

## 2019-12-23 DIAGNOSIS — M1991 Primary osteoarthritis, unspecified site: Secondary | ICD-10-CM | POA: Diagnosis not present

## 2019-12-24 ENCOUNTER — Ambulatory Visit: Payer: Medicare Other | Attending: Internal Medicine

## 2019-12-24 ENCOUNTER — Other Ambulatory Visit: Payer: Self-pay

## 2019-12-24 DIAGNOSIS — Z20822 Contact with and (suspected) exposure to covid-19: Secondary | ICD-10-CM

## 2019-12-25 ENCOUNTER — Telehealth: Payer: Self-pay

## 2019-12-25 LAB — NOVEL CORONAVIRUS, NAA: SARS-CoV-2, NAA: NOT DETECTED

## 2019-12-25 NOTE — Telephone Encounter (Signed)
Pt. Checking on COVID 19 results, not available yet. °

## 2020-01-02 DIAGNOSIS — R040 Epistaxis: Secondary | ICD-10-CM | POA: Diagnosis not present

## 2020-01-10 ENCOUNTER — Ambulatory Visit: Payer: Medicare Other | Admitting: Cardiovascular Disease

## 2020-01-30 DIAGNOSIS — R040 Epistaxis: Secondary | ICD-10-CM | POA: Diagnosis not present

## 2020-01-31 ENCOUNTER — Ambulatory Visit: Payer: Medicare Other | Admitting: Cardiovascular Disease

## 2020-01-31 ENCOUNTER — Encounter: Payer: Self-pay | Admitting: Cardiovascular Disease

## 2020-01-31 ENCOUNTER — Other Ambulatory Visit: Payer: Self-pay

## 2020-01-31 ENCOUNTER — Telehealth: Payer: Self-pay

## 2020-01-31 VITALS — BP 100/60 | HR 76 | Temp 98.5°F | Ht 67.0 in | Wt 174.0 lb

## 2020-01-31 DIAGNOSIS — F419 Anxiety disorder, unspecified: Secondary | ICD-10-CM | POA: Diagnosis not present

## 2020-01-31 DIAGNOSIS — F439 Reaction to severe stress, unspecified: Secondary | ICD-10-CM

## 2020-01-31 DIAGNOSIS — R002 Palpitations: Secondary | ICD-10-CM | POA: Diagnosis not present

## 2020-01-31 DIAGNOSIS — L720 Epidermal cyst: Secondary | ICD-10-CM | POA: Diagnosis not present

## 2020-01-31 DIAGNOSIS — R079 Chest pain, unspecified: Secondary | ICD-10-CM | POA: Diagnosis not present

## 2020-01-31 DIAGNOSIS — R000 Tachycardia, unspecified: Secondary | ICD-10-CM | POA: Diagnosis not present

## 2020-01-31 NOTE — Progress Notes (Signed)
SUBJECTIVE: Natasha Wright is a 59 y.o. female with a history of chest pain, tachycardia, and palpitations.   She has a history of multiple cervical spine surgeries and has markedly diminished range of motion. She is on disability. She also has lower back pain, hip pain, diffuse osteoarthritic pain, and bilateral feet pain and has undergone surgeries in both of her feet.  She underwent a low risk nuclear stress test on 04/29/2017, LVEF 61%. There was a very small apical defect that appeared to be most consistent with variable soft tissue attenuation.  She told me about how her son was diagnosed with COVID-19.  He is doing well now.  She recently underwent sinus surgery.  She to me about how she tore her right hamstring and it is inoperable.  She has a lot of anxiety and stress.  Palpitations are relatively stable overall.  She has occasional upper left-sided chest pains when palpitations are worse.    Soc Hx: Her husband is retired from the [employer]. This is her third marriage. They have been married x 17 yrs.  She has an adult son with Down's syndrome Natasha Wright) who has several cardiac issues.  Review of Systems: As per "subjective", otherwise negative.  Allergies  Allergen Reactions  . Penicillins Shortness Of Breath and Rash    Has patient had a PCN reaction causing immediate rash, facial/tongue/throat swelling, SOB or lightheadedness with hypotension: Yes Has patient had a PCN reaction causing severe rash involving mucus membranes or skin necrosis: No Has patient had a PCN reaction that required hospitalization Yes Has patient had a PCN reaction occurring within the last 10 years: No If all of the above answers are "NO", then may proceed with Cephalosporin use.   . Sulfamethoxazole-Trimethoprim Shortness Of Breath and Rash  . Adhesive [Tape]     Rips off skin, paper tape is ok  . Codeine     REACTION: gi upset, skin crawling  . Fentanyl     REACTION: skin  crawling, nausea, passed out  . Morphine     REACTION: GI upset, skin crawling  . Nsaids     Upset stomach  . Influenza Vaccines Rash  . Latex Rash    Current Outpatient Medications  Medication Sig Dispense Refill  . acetaminophen (TYLENOL) 650 MG CR tablet Take 1,300 mg by mouth 2 (two) times daily as needed for pain.    . citalopram (CELEXA) 20 MG tablet Take 1 tablet (20 mg total) by mouth 2 (two) times daily. (Patient taking differently: Take 10-20 mg by mouth 2 (two) times daily. Takes 10mg  in am and 20mg  in pm) 180 tablet 3  . diazepam (VALIUM) 10 MG tablet Take 10 mg by mouth every 8 (eight) hours as needed (muscle spasms).     . dicyclomine (BENTYL) 20 MG tablet Take 20 mg by mouth 4 (four) times daily as needed for spasms.    [name] diltiazem (CARDIZEM) 60 MG tablet TAKE 1 TABLET BY MOUTH  TWICE DAILY 180 tablet 3  . estradiol (ESTRACE) 0.5 MG tablet Take 0.5 mg by mouth at bedtime.     . fluticasone (FLONASE) 50 MCG/ACT nasal spray Place 1 spray into both nostrils 2 (two) times daily.    [name] ipratropium (ATROVENT) 0.06 % nasal spray Place 2 sprays into both nostrils daily.    . Multiple Vitamin (MULTIVITAMIN WITH MINERALS) TABS tablet Take 1 tablet by mouth daily.    . pantoprazole (PROTONIX) 40 MG tablet TAKE 1  TABLET BY MOUTH 30 MINUTES PRIOR TO MEALS TWICE DAILY 60 tablet 11   No current facility-administered medications for this visit.    Past Medical History:  Diagnosis Date  . Anxiety   . Arthritis   . B12 deficiency    h/o  . Family history of heart disease   . GERD (gastroesophageal reflux disease)   . Hypoglycemia   . IBS (irritable bowel syndrome)   . Lumbar disc disease   . OA (osteoarthritis)   . PONV (postoperative nausea and vomiting)    needs scop patch  . Rectocele   . Shingles   . Spinal stenosis in cervical region     Past Surgical History:  Procedure Laterality Date  . ABDOMINAL HYSTERECTOMY  1992  . ARTHRODESIS FOOT WITH WEIL OSTEOTOMY Left  05/09/2014   Procedure: LEFT FIRST TARSAL METATARSAL  ARTHRODESIS; LEFT SECOND WEIL OSTEOTOMY;  Surgeon: Wylene Simmer, MD;  Location: [facility];  Service: Orthopedics;  Laterality: Left;  . BIOPSY  07/11/2018   Procedure: BIOPSY;  Surgeon: Danie Binder, MD;  [facility];  Service: Endoscopy;;  duodenal biopsy , gastric biopsy   . BUNIONECTOMY WITH HAMMERTOE RECONSTRUCTION Left 05/09/2014   Procedure: LEFT MODIFIED MCBRIDE BUNIONECTOMY; LEFT SECOND HAMMER TOE CORRECTION;  Surgeon: Wylene Simmer, MD;  Location: [facility];  Service: Orthopedics;  Laterality: Left;  . CERVICAL SPINE SURGERY  2009  . CHOLECYSTECTOMY  2004   lap choli  . COLONOSCOPY  07/08/2003   RMR: Left-sided diverticula, remainder of colonic mucosa and terminal ileum appeared normal.. Minimally friable internal hemorrhoids, otherwise normal rectum  . COLONOSCOPY N/A 04/30/2013   Dr. Oneida Alar: mild diverticulosis in descending and sigmoid colon, internal hemorrhoids.   [name] DILATION AND CURETTAGE OF UTERUS    . ESOPHAGOGASTRODUODENOSCOPY (EGD) WITH PROPOFOL N/A 07/11/2018   Procedure: ESOPHAGOGASTRODUODENOSCOPY (EGD) WITH PROPOFOL;  Surgeon: Danie Binder, MD;  [facility];  Service: Endoscopy;  Laterality: N/A;  9:30am  . NM MYOCAR PERF WALL MOTION  10/2011   bruce myoview - breast attenuation noted in anterior region; EF 65%; no ischemia/infarct/scar; low risk  . SAVORY DILATION N/A 07/11/2018   Procedure: SAVORY DILATION;  Surgeon: Danie Binder, MD;  [facility];  Service: Endoscopy;  Laterality: N/A;  . TONSILLECTOMY  1980  . TUBAL LIGATION      Social History   Socioeconomic History  . Marital status: Married    Spouse name: Natasha Wright  . Number of children: 1  . Years of education: 47  . Highest education level: Not on file  Occupational History  . Occupation: Unemployed    Employer: [employer]    Employer: UNEMPLOYED  Tobacco Use  . Smoking  status: Former Smoker    Packs/day: 0.50    Years: 40.00    Pack years: 20.00    Types: Cigarettes    Quit date: 12/24/2013    Years since quitting: 6.1  . Smokeless tobacco: Never Used  . Tobacco comment: using vape as aid to sustain from smoking  Substance and Sexual Activity  . Alcohol use: No  . Drug use: No  . Sexual activity: Not on file  Other Topics Concern  . Not on file  Social History Narrative   One son, Down syndrome,is right handed ,resides with son and husband   Social Determinants of Radio broadcast assistant Strain:   . Difficulty of Paying Living Expenses:   Food Insecurity:   .  Worried About Charity fundraiser in the Last Year:   . Arboriculturist in the Last Year:   Transportation Needs:   . Film/video editor (Medical):   [name] Lack of Transportation (Non-Medical):   Physical Activity:   . Days of Exercise per Week:   . Minutes of Exercise per Session:   Stress:   . Feeling of Stress :   Social Connections:   . Frequency of Communication with Friends and Family:   . Frequency of Social Gatherings with Friends and Family:   . Attends Religious Services:   . Active Member of Clubs or Organizations:   . Attends Archivist Meetings:   [name] Marital Status:   Intimate Partner Violence:   . Fear of Current or Ex-Partner:   . Emotionally Abused:   [name] Physically Abused:   . Sexually Abused:     Barbarann Ehlers, RN was present throughout the entirety of the encounter.  Vitals:   01/31/20 1410  BP: 100/60  Pulse: 76  Temp: 98.5 F (36.9 C)  SpO2: 97%  Height: 5\' 7"  (1.702 m)    Wt Readings from Last 3 Encounters:  08/09/19 155 lb (70.3 kg)  06/15/19 160 lb (72.6 kg)  04/26/19 169 lb 6.4 oz (76.8 kg)     PHYSICAL EXAM General: NAD HEENT: Normal. Neck: No JVD, no thyromegaly. Lungs: Clear to auscultation bilaterally with normal respiratory effort. CV: Regular rate and rhythm, normal S1/S2, no S3/S4, no murmur. No pretibial or  periankle edema.  No carotid bruit.   Abdomen: Soft, nontender, no distention.  Neurologic: Alert and oriented.  Psych: Normal affect. Skin: Normal. Musculoskeletal: No gross deformities.      Labs: Lab Results  Component Value Date/Time   K 4.8 02/10/2018 10:15 AM   BUN 14 02/10/2018 10:15 AM   CREATININE 0.80 10/13/2018 09:47 AM   ALT 18 02/10/2018 10:15 AM   TSH 1.337 03/30/2017 03:51 PM   TSH 1.382 12/06/2008 12:00 AM   HGB 14.6 02/10/2018 10:15 AM     Lipids: Lab Results  Component Value Date/Time   LDLCALC 87 06/11/2009 11:47 PM   CHOL 195 06/11/2009 11:47 PM   TRIG 238 (H) 06/11/2009 11:47 PM   HDL 60 06/11/2009 11:47 PM      Prior CV studies:   The following studies were reviewed today:  Echo 04/11/17:  Study Conclusions  - Left ventricle: The cavity size was normal. Wall thickness was increased in a pattern of mild LVH. Systolic function was normal. The estimated ejection fraction was in the range of 55% to 60%. Wall motion was normal; there were no regional wall motion abnormalities. There was a borderline abnormality in the ratio of early to atrial left ventricular filling.   ASSESSMENT AND PLAN:  1. Tachycardia and palpitations:Symptomatically stable. I will continue diltiazem 60 mg twice dailyandcitalopram 10 mg q am and 20 mg q pm.  2. Chest pain:Symptomatically stable.She underwent a low risk nuclear stress test on 04/29/2017, LVEF 61%. There was a very small apical defect that appeared to be most consistent with variable soft tissue attenuation.Some of her chest wall pains are musculoskeletal in etiology. I will continue to monitor her symptoms.  3. Anxiety and stress:Controlled on citalopram 10 mg q am and 20 mg q pm.   Disposition: Follow up 3 months   Kate Sable, M.D., F.A.C.C.

## 2020-01-31 NOTE — Telephone Encounter (Signed)

## 2020-01-31 NOTE — Patient Instructions (Signed)
Medication Instructions:  Your physician recommends that you continue on your current medications as directed. Please refer to the Current Medication list given to you today.  *If you need a refill on your cardiac medications before your next appointment, please call your pharmacy*   Lab Work: None today If you have labs (blood work) drawn today and your tests are completely normal, you will receive your results only by: . MyChart Message (if you have MyChart) OR . A paper copy in the mail If you have any lab test that is abnormal or we need to change your treatment, we will call you to review the results.   Testing/Procedures: None today   Follow-Up: At [facility], you and your health needs are our priority.  As part of our continuing mission to provide you with exceptional heart care, we have created designated Provider Care Teams.  These Care Teams include your primary Cardiologist (physician) and Advanced Practice Providers (APPs -  Physician Assistants and Nurse Practitioners) who all work together to provide you with the care you need, when you need it.  We recommend signing up for the patient portal called "MyChart".  Sign up information is provided on this After Visit Summary.  MyChart is used to connect with patients for Virtual Visits (Telemedicine).  Patients are able to view lab/test results, encounter notes, upcoming appointments, etc.  Non-urgent messages can be sent to your provider as well.   To learn more about what you can do with MyChart, go to [email or web address].    Your next appointment:   3 month(s)  The format for your next appointment:   Virtual Visit   Provider:   Suresh Koneswaran, MD   Other Instructions None       Thank you for choosing [facility] !         

## 2020-02-04 DIAGNOSIS — H25093 Other age-related incipient cataract, bilateral: Secondary | ICD-10-CM | POA: Diagnosis not present

## 2020-02-04 DIAGNOSIS — H16223 Keratoconjunctivitis sicca, not specified as Sjogren's, bilateral: Secondary | ICD-10-CM | POA: Diagnosis not present

## 2020-02-04 DIAGNOSIS — H02822 Cysts of right lower eyelid: Secondary | ICD-10-CM | POA: Diagnosis not present

## 2020-02-13 ENCOUNTER — Other Ambulatory Visit: Payer: Self-pay | Admitting: Internal Medicine

## 2020-02-13 DIAGNOSIS — Z1231 Encounter for screening mammogram for malignant neoplasm of breast: Secondary | ICD-10-CM

## 2020-02-14 DIAGNOSIS — L218 Other seborrheic dermatitis: Secondary | ICD-10-CM | POA: Diagnosis not present

## 2020-02-14 DIAGNOSIS — L720 Epidermal cyst: Secondary | ICD-10-CM | POA: Diagnosis not present

## 2020-02-20 DIAGNOSIS — G894 Chronic pain syndrome: Secondary | ICD-10-CM | POA: Diagnosis not present

## 2020-02-20 DIAGNOSIS — M1991 Primary osteoarthritis, unspecified site: Secondary | ICD-10-CM | POA: Diagnosis not present

## 2020-02-27 DIAGNOSIS — R040 Epistaxis: Secondary | ICD-10-CM | POA: Diagnosis not present

## 2020-02-29 ENCOUNTER — Other Ambulatory Visit: Payer: Self-pay

## 2020-02-29 ENCOUNTER — Ambulatory Visit
Admission: RE | Admit: 2020-02-29 | Discharge: 2020-02-29 | Disposition: A | Discharge status: Home / Self Care | Payer: Medicare Other | Source: Ambulatory Visit | Attending: Internal Medicine | Admitting: Internal Medicine

## 2020-02-29 DIAGNOSIS — Z1231 Encounter for screening mammogram for malignant neoplasm of breast: Secondary | ICD-10-CM | POA: Diagnosis not present

## 2020-03-03 DIAGNOSIS — G4700 Insomnia, unspecified: Secondary | ICD-10-CM | POA: Diagnosis not present

## 2020-03-03 DIAGNOSIS — R5383 Other fatigue: Secondary | ICD-10-CM | POA: Diagnosis not present

## 2020-03-04 DIAGNOSIS — E559 Vitamin D deficiency, unspecified: Secondary | ICD-10-CM | POA: Diagnosis not present

## 2020-03-04 DIAGNOSIS — Z79899 Other long term (current) drug therapy: Secondary | ICD-10-CM | POA: Diagnosis not present

## 2020-03-04 DIAGNOSIS — R5383 Other fatigue: Secondary | ICD-10-CM | POA: Diagnosis not present

## 2020-03-04 DIAGNOSIS — Z13220 Encounter for screening for lipoid disorders: Secondary | ICD-10-CM | POA: Diagnosis not present

## 2020-03-28 DIAGNOSIS — M25571 Pain in right ankle and joints of right foot: Secondary | ICD-10-CM | POA: Diagnosis not present

## 2020-04-02 DIAGNOSIS — M25571 Pain in right ankle and joints of right foot: Secondary | ICD-10-CM | POA: Diagnosis not present

## 2020-04-04 DIAGNOSIS — M25511 Pain in right shoulder: Secondary | ICD-10-CM | POA: Diagnosis not present

## 2020-04-17 ENCOUNTER — Other Ambulatory Visit: Payer: Self-pay

## 2020-04-17 ENCOUNTER — Telehealth: Payer: Medicare Other | Admitting: Cardiovascular Disease

## 2020-05-14 DIAGNOSIS — M79671 Pain in right foot: Secondary | ICD-10-CM | POA: Diagnosis not present

## 2020-05-14 DIAGNOSIS — M25571 Pain in right ankle and joints of right foot: Secondary | ICD-10-CM | POA: Diagnosis not present

## 2020-05-20 DIAGNOSIS — M25571 Pain in right ankle and joints of right foot: Secondary | ICD-10-CM | POA: Diagnosis not present

## 2020-05-21 DIAGNOSIS — M1991 Primary osteoarthritis, unspecified site: Secondary | ICD-10-CM | POA: Diagnosis not present

## 2020-05-21 DIAGNOSIS — G894 Chronic pain syndrome: Secondary | ICD-10-CM | POA: Diagnosis not present

## 2020-05-28 ENCOUNTER — Telehealth: Payer: Self-pay | Admitting: *Deleted

## 2020-05-28 DIAGNOSIS — R079 Chest pain, unspecified: Secondary | ICD-10-CM

## 2020-05-28 NOTE — Telephone Encounter (Signed)
Spoke with Natasha Wright, she says her chest is getting better. She has h/o PTSD and is undergoing tramendous amount of stress after her son with Down syndrome became ill. She was last seen by Dr. Bronson Ing in March at the time, Dr. Bronson Ing felt her chest pain was atypical and likely related to emotional stress. Patient does mention she only has chest pain when she is extremely stressed. Last myoview in 2018 was low risk and she has no prior h/o CAD. She is unable to embulate very far due to R ankle tendon injury for which she is undergoing surgery for. Will check with DOD tomorrow to see if patient can be cleared without further workup.

## 2020-05-28 NOTE — Telephone Encounter (Signed)
   [facility] Pre-operative Risk Assessment    HEARTCARE STAFF: - Please ensure there is not already an duplicate clearance open for this procedure. - Under Visit Info/Reason for Call, type in Other and utilize the format Clearance MM/DD/YY or Clearance TBD. Do not use dashes or single digits. - If request is for dental extraction, please clarify the # of teeth to be extracted.  Request for surgical clearance:  1. What type of surgery is being performed? EXCISION OF RIGHT ANKLE GANGLION CYST, DEBRIDEMENT OF PERONEAL TENDONS, SUBTALAR JOINT ARTHROTOMY WITH REMOVAL OF LOOSE BODIES, TREATMENT OF TALAR OSTEOCHONDRAL LESION, DECOMPRESSION OF SURAL NERVE   2. When is this surgery scheduled? 06/12/20   3. What type of clearance is required (medical clearance vs. Pharmacy clearance to hold med vs. Both)? MEDICAL  4. Are there any medications that need to be held prior to surgery and how long? NONE LISTED  5. Practice name and name of physician performing surgery? [facility]; DR. Gerald Stabs ADAIR   6. What is the office phone number? [phone]   7.   What is the office fax number? [phone]  8.   Anesthesia type (None, local, MAC, general) ? GENERAL   Julaine Hua 05/28/2020, 1:54 PM  _________________________________________________________________   (provider comments below)

## 2020-05-29 NOTE — Telephone Encounter (Signed)
I spoke with DOD Dr. Harrell Gave regarding her case, it is a difficult case given her intermittent chest pain which may have a emotional trigger and the urgent need for upcoming surgery. Dr. Harrell Gave recommended a myoview to be performed in our [facility] office which has a better nuclear scanner and less like to cause the artifact that was seen on her previous myoview. I discussed with the patient, she is agreeable to have a myoview done at [facility] office prior to her surgery.

## 2020-05-29 NOTE — Telephone Encounter (Signed)
Placed order for Nuclear Lexiscan to be done at [facility] office and will route to scheduling to get patient scheduled for the [place] in 1 week.

## 2020-06-02 ENCOUNTER — Telehealth (HOSPITAL_COMMUNITY): Payer: Self-pay

## 2020-06-02 NOTE — Telephone Encounter (Signed)
Spoke with the patient, detailled instructions given. She stated that she would be here for her test. Asked to call back with any questions. S.Brandii Lakey EMTP

## 2020-06-05 ENCOUNTER — Ambulatory Visit (HOSPITAL_COMMUNITY): Payer: Medicare Other | Attending: Cardiovascular Disease

## 2020-06-05 ENCOUNTER — Other Ambulatory Visit: Payer: Self-pay

## 2020-06-05 DIAGNOSIS — R079 Chest pain, unspecified: Secondary | ICD-10-CM

## 2020-06-05 LAB — MYOCARDIAL PERFUSION IMAGING
Peak HR: 102 {beats}/min
Rest HR: 71 {beats}/min

## 2020-06-05 MED ORDER — TECHNETIUM TC 99M TETROFOSMIN IV KIT
32.8000 | PACK | Freq: Once | INTRAVENOUS | Status: AC | PRN
  Administered 2020-06-05: 32.8 via INTRAVENOUS
  Filled 2020-06-05: qty 33

## 2020-06-05 MED ORDER — TECHNETIUM TC 99M TETROFOSMIN IV KIT
10.6000 | PACK | Freq: Once | INTRAVENOUS | Status: AC | PRN
  Administered 2020-06-05: 10.6 via INTRAVENOUS
  Filled 2020-06-05: qty 11

## 2020-06-05 MED ORDER — REGADENOSON 0.4 MG/5ML IV SOLN
0.4000 mg | Freq: Once | INTRAVENOUS | Status: AC
  Administered 2020-06-05: 0.4 mg via INTRAVENOUS

## 2020-06-05 NOTE — Telephone Encounter (Signed)
   Primary Cardiologist: Kate Sable, MD  Chart reviewed as part of pre-operative protocol coverage. Given past medical history and time since last visit, based on ACC/AHA guidelines, Elisandra Deshmukh Wuertz would be at acceptable risk for the planned procedure without further cardiovascular testing given stress test 06/05/20 which was negative for ischemia and revealed EF 53%.   I will route this recommendation to the requesting party via Epic fax function and remove from pre-op pool.  Please call with questions.  Abigail Butts, PA-C 06/05/2020, 3:12 PM

## 2020-06-05 NOTE — Telephone Encounter (Signed)
Left detailed message as noted(normal stress test/cleared for ankle surgery) for patient (Natasha Wright), call back w/any questions

## 2020-06-12 ENCOUNTER — Other Ambulatory Visit: Payer: Self-pay | Admitting: Otolaryngology

## 2020-06-12 DIAGNOSIS — M19071 Primary osteoarthritis, right ankle and foot: Secondary | ICD-10-CM | POA: Diagnosis not present

## 2020-06-12 DIAGNOSIS — M7671 Peroneal tendinitis, right leg: Secondary | ICD-10-CM | POA: Diagnosis not present

## 2020-06-12 DIAGNOSIS — M24071 Loose body in right ankle: Secondary | ICD-10-CM | POA: Diagnosis not present

## 2020-06-12 DIAGNOSIS — G5780 Other specified mononeuropathies of unspecified lower limb: Secondary | ICD-10-CM | POA: Diagnosis not present

## 2020-06-12 DIAGNOSIS — G8918 Other acute postprocedural pain: Secondary | ICD-10-CM | POA: Diagnosis not present

## 2020-06-12 DIAGNOSIS — M67471 Ganglion, right ankle and foot: Secondary | ICD-10-CM | POA: Diagnosis not present

## 2020-06-12 DIAGNOSIS — G5781 Other specified mononeuropathies of right lower limb: Secondary | ICD-10-CM | POA: Diagnosis not present

## 2020-06-13 DIAGNOSIS — Z9889 Other specified postprocedural states: Secondary | ICD-10-CM | POA: Diagnosis not present

## 2020-06-13 DIAGNOSIS — M19071 Primary osteoarthritis, right ankle and foot: Secondary | ICD-10-CM | POA: Diagnosis not present

## 2020-06-18 ENCOUNTER — Other Ambulatory Visit: Payer: Self-pay | Admitting: Student

## 2020-06-18 MED ORDER — DILTIAZEM HCL 60 MG PO TABS: 60.0000 mg | ORAL_TABLET | Freq: Two times a day (BID) | ORAL | 3 refills | Status: DC

## 2020-06-20 DIAGNOSIS — K219 Gastro-esophageal reflux disease without esophagitis: Secondary | ICD-10-CM | POA: Diagnosis not present

## 2020-06-23 DIAGNOSIS — Z0000 Encounter for general adult medical examination without abnormal findings: Secondary | ICD-10-CM | POA: Diagnosis not present

## 2020-06-23 DIAGNOSIS — Z1389 Encounter for screening for other disorder: Secondary | ICD-10-CM | POA: Diagnosis not present

## 2020-06-23 DIAGNOSIS — R000 Tachycardia, unspecified: Secondary | ICD-10-CM | POA: Diagnosis not present

## 2020-06-23 DIAGNOSIS — R002 Palpitations: Secondary | ICD-10-CM | POA: Diagnosis not present

## 2020-06-23 DIAGNOSIS — L0100 Impetigo, unspecified: Secondary | ICD-10-CM | POA: Diagnosis not present

## 2020-06-27 DIAGNOSIS — M25571 Pain in right ankle and joints of right foot: Secondary | ICD-10-CM | POA: Diagnosis not present

## 2020-07-07 ENCOUNTER — Other Ambulatory Visit: Payer: Self-pay

## 2020-07-07 ENCOUNTER — Ambulatory Visit
Admission: RE | Admit: 2020-07-07 | Discharge: 2020-07-07 | Disposition: A | Discharge status: Home / Self Care | Payer: Medicare Other | Source: Ambulatory Visit | Attending: Emergency Medicine | Admitting: Emergency Medicine

## 2020-07-07 VITALS — BP 132/91 | HR 90 | Temp 98.9°F | Resp 20

## 2020-07-07 DIAGNOSIS — L03115 Cellulitis of right lower limb: Secondary | ICD-10-CM

## 2020-07-07 DIAGNOSIS — M25471 Effusion, right ankle: Secondary | ICD-10-CM | POA: Diagnosis not present

## 2020-07-07 DIAGNOSIS — M25571 Pain in right ankle and joints of right foot: Secondary | ICD-10-CM

## 2020-07-07 MED ORDER — DOXYCYCLINE HYCLATE 100 MG PO CAPS: 100.0000 mg | ORAL_CAPSULE | Freq: Two times a day (BID) | ORAL | 0 refills | Status: DC

## 2020-07-07 NOTE — ED Provider Notes (Signed)
[place]   [id number] 07/07/20 Arrival Time: 6803  CC: Cellulitis   SUBJECTIVE:  Natasha Wright is a 59 y.o. female who presents with a LT ankle pain and swelling x 3 days.  Two wounds to LLE.  Wound to outside of LT ankle from ganglion cyst removal on 7/22 and sore to upper LLE.  Complains of associated redness, and warmth to the touch.  Denies alleviating or aggravating factors.  Reports similar symptoms in the past with cellulitis.   Denies fever, chills, nausea, vomiting, discharge, SOB, chest pain, abdominal pain, changes in bowel or bladder function.    ROS: As per HPI.  All other pertinent ROS negative.     Past Medical History:  Diagnosis Date  . Anxiety   . Arthritis   . B12 deficiency    h/o  . Family history of heart disease   . GERD (gastroesophageal reflux disease)   . Hypoglycemia   . IBS (irritable bowel syndrome)   . Lumbar disc disease   . OA (osteoarthritis)   . PONV (postoperative nausea and vomiting)    needs scop patch  . Rectocele   . Shingles   . Spinal stenosis in cervical region    Past Surgical History:  Procedure Laterality Date  . ABDOMINAL HYSTERECTOMY  1992  . ARTHRODESIS FOOT WITH WEIL OSTEOTOMY Left 05/09/2014   Procedure: LEFT FIRST TARSAL METATARSAL  ARTHRODESIS; LEFT SECOND WEIL OSTEOTOMY;  Surgeon: Wylene Simmer, MD;  Location: [place];  Service: Orthopedics;  Laterality: Left;  . BIOPSY  07/11/2018   Procedure: BIOPSY;  Surgeon: Danie Binder, MD;  [facility];  Service: Endoscopy;;  duodenal biopsy , gastric biopsy   . BUNIONECTOMY WITH HAMMERTOE RECONSTRUCTION Left 05/09/2014   Procedure: LEFT MODIFIED MCBRIDE BUNIONECTOMY; LEFT SECOND HAMMER TOE CORRECTION;  Surgeon: Wylene Simmer, MD;  Location: [place];  Service: Orthopedics;  Laterality: Left;  . CERVICAL SPINE SURGERY  2009  . CHOLECYSTECTOMY  2004   lap choli  . COLONOSCOPY  07/08/2003   RMR: Left-sided diverticula,  remainder of colonic mucosa and terminal ileum appeared normal.. Minimally friable internal hemorrhoids, otherwise normal rectum  . COLONOSCOPY N/A 04/30/2013   Dr. Oneida Alar: mild diverticulosis in descending and sigmoid colon, internal hemorrhoids.   [name] DILATION AND CURETTAGE OF UTERUS    . ESOPHAGOGASTRODUODENOSCOPY (EGD) WITH PROPOFOL N/A 07/11/2018   Procedure: ESOPHAGOGASTRODUODENOSCOPY (EGD) WITH PROPOFOL;  Surgeon: Danie Binder, MD;  [facility];  Service: Endoscopy;  Laterality: N/A;  9:30am  . NM MYOCAR PERF WALL MOTION  10/2011   bruce myoview - breast attenuation noted in anterior region; EF 65%; no ischemia/infarct/scar; low risk  . SAVORY DILATION N/A 07/11/2018   Procedure: SAVORY DILATION;  Surgeon: Danie Binder, MD;  [facility];  Service: Endoscopy;  Laterality: N/A;  . TONSILLECTOMY  1980  . TUBAL LIGATION     Allergies  Allergen Reactions  . Penicillins Shortness Of Breath and Rash    Has patient had a PCN reaction causing immediate rash, facial/tongue/throat swelling, SOB or lightheadedness with hypotension: Yes Has patient had a PCN reaction causing severe rash involving mucus membranes or skin necrosis: No Has patient had a PCN reaction that required hospitalization Yes Has patient had a PCN reaction occurring within the last 10 years: No If all of the above answers are "NO", then may proceed with Cephalosporin use.   . Sulfamethoxazole-Trimethoprim Shortness Of Breath and Rash  . Adhesive [Tape]  Rips off skin, paper tape is ok  . Codeine     REACTION: gi upset, skin crawling  . Fentanyl     REACTION: skin crawling, nausea, passed out  . Morphine     REACTION: GI upset, skin crawling  . Nsaids     Upset stomach  . Influenza Vaccines Rash  . Latex Rash   No current facility-administered medications on file prior to encounter.   Current Outpatient Medications on File Prior to Encounter  Medication Sig Dispense Refill  .  acetaminophen (TYLENOL) 650 MG CR tablet Take 1,300 mg by mouth 2 (two) times daily as needed for pain.    . citalopram (CELEXA) 20 MG tablet Take 1 tablet (20 mg total) by mouth 2 (two) times daily. (Patient taking differently: Take 10-20 mg by mouth 2 (two) times daily. Takes 10mg  in am and 20mg  in pm) 180 tablet 3  . diazepam (VALIUM) 10 MG tablet Take 10 mg by mouth every 8 (eight) hours as needed (muscle spasms).     . dicyclomine (BENTYL) 20 MG tablet Take 20 mg by mouth 4 (four) times daily as needed for spasms.    [name] diltiazem (CARDIZEM) 60 MG tablet Take 1 tablet (60 mg total) by mouth 2 (two) times daily. 180 tablet 3  . estradiol (ESTRACE) 0.5 MG tablet Take 0.5 mg by mouth at bedtime.     . fluticasone (FLONASE) 50 MCG/ACT nasal spray Place 1 spray into both nostrils 2 (two) times daily.    [name] ipratropium (ATROVENT) 0.06 % nasal spray Place 2 sprays into both nostrils daily.    . Multiple Vitamin (MULTIVITAMIN WITH MINERALS) TABS tablet Take 1 tablet by mouth daily.    . pantoprazole (PROTONIX) 40 MG tablet TAKE 1 TABLET BY MOUTH 30 MINUTES PRIOR TO MEALS TWICE DAILY 60 tablet 11   Social History   Socioeconomic History  . Marital status: Married    Spouse name: Mikeal Hawthorne  . Number of children: 1  . Years of education: 61  . Highest education level: Not on file  Occupational History  . Occupation: Unemployed    Employer: [employer]    Employer: UNEMPLOYED  Tobacco Use  . Smoking status: Former Smoker    Packs/day: 0.50    Years: 40.00    Pack years: 20.00    Types: Cigarettes    Quit date: 12/24/2013    Years since quitting: 6.5  . Smokeless tobacco: Never Used  . Tobacco comment: using vape as aid to sustain from smoking  Vaping Use  . Vaping Use: Never used  Substance and Sexual Activity  . Alcohol use: No  . Drug use: No  . Sexual activity: Not on file  Other Topics Concern  . Not on file  Social History Narrative   One son, Down syndrome,is right handed  ,resides with son and husband   Social Determinants of Radio broadcast assistant Strain:   . Difficulty of Paying Living Expenses:   Food Insecurity:   . Worried About Charity fundraiser in the Last Year:   . Arboriculturist in the Last Year:   Transportation Needs:   . Film/video editor (Medical):   [name] Lack of Transportation (Non-Medical):   Physical Activity:   . Days of Exercise per Week:   . Minutes of Exercise per Session:   Stress:   . Feeling of Stress :   Social Connections:   . Frequency of Communication with Friends and Family:   .  Frequency of Social Gatherings with Friends and Family:   . Attends Religious Services:   . Active Member of Clubs or Organizations:   . Attends Archivist Meetings:   [name] Marital Status:   Intimate Partner Violence:   . Fear of Current or Ex-Partner:   . Emotionally Abused:   [name] Physically Abused:   . Sexually Abused:    Family History  Problem Relation Age of Onset  . Diabetes Mother   . Hypertension Mother   . Hyperlipidemia Mother   . Ulcerative colitis Father   . Breast cancer Maternal Grandmother   . Hypertension Maternal Grandmother   . Diabetes Maternal Grandmother   . Stroke Maternal Grandmother   . Asthma Maternal Grandmother   . Stroke Maternal Grandfather   . Stroke Paternal Grandmother   . Diabetes Paternal Grandmother   . Heart disease Paternal Grandmother   . Heart disease Paternal Grandfather   . Hyperlipidemia Brother   . Hyperlipidemia Sister   . Hypertension Sister   . Diabetes Sister   . Down syndrome Child   . Heart disease Child   . Colon cancer Neg Hx     OBJECTIVE: Vitals:   07/07/20 1429  BP: (!) 132/91  Pulse: 90  Resp: 20  Temp: 98.9 F (37.2 C)  SpO2: 94%    General appearance: alert; no distress Head: NCAT Lungs: normal respiratory effort CV: Dorsalis pedis pulse 2+ Skin: warm and dry; post-surgical wound to RT LLE to lateral ankle with mild erythema, diffuse swelling  about the lateral and medial ankle, another small 0.25 cm circular wound to RT proximal lateral LLE; both wounds TTP, no obvious drainage or bleeding Psychological: alert and cooperative; normal mood and affect  ASSESSMENT & PLAN:  1. Pain and swelling of ankle, right   2. Cellulitis of right lower extremity     Meds ordered this encounter  Medications  . doxycycline (VIBRAMYCIN) 100 MG capsule    Sig: Take 1 capsule (100 mg total) by mouth 2 (two) times daily.    Dispense:  20 capsule    Refill:  0    Order Specific Question:   Supervising Provider    Answer:   Raylene Everts [[id number]]    Prescribed doxycycline take as directed and to completion Continue to alternate ibuprofen and tylenol as needed for pain and fever Follow up with PCP if symptoms persists Return or go to the ED if you have any new or worsening symptoms such as increased pain, redness, swelling, discharge, high fever, night sweats, abdominal pain, etc...    Reviewed expectations re: course of current medical issues. Questions answered. Outlined signs and symptoms indicating need for more acute intervention. Patient verbalized understanding. After Visit Summary given.   Lestine Box, PA-C 07/07/20 1500

## 2020-07-07 NOTE — ED Triage Notes (Signed)
Pt has swelling on right foot and leg post ganglion cysts

## 2020-07-07 NOTE — Discharge Instructions (Addendum)
Prescribed doxycycline take as directed and to completion Continue to alternate ibuprofen and tylenol as needed for pain and fever Follow up with PCP if symptoms persists Return or go to the ED if you have any new or worsening symptoms such as increased pain, redness, swelling, discharge, high fever, night sweats, abdominal pain, etc..[name]

## 2020-07-09 DIAGNOSIS — M25571 Pain in right ankle and joints of right foot: Secondary | ICD-10-CM | POA: Diagnosis not present

## 2020-07-14 DIAGNOSIS — I890 Lymphedema, not elsewhere classified: Secondary | ICD-10-CM | POA: Diagnosis not present

## 2020-07-14 DIAGNOSIS — I87391 Chronic venous hypertension (idiopathic) with other complications of right lower extremity: Secondary | ICD-10-CM | POA: Diagnosis not present

## 2020-07-16 ENCOUNTER — Encounter (HOSPITAL_COMMUNITY): Payer: Medicare Other

## 2020-07-22 DIAGNOSIS — K219 Gastro-esophageal reflux disease without esophagitis: Secondary | ICD-10-CM | POA: Diagnosis not present

## 2020-07-22 DIAGNOSIS — I872 Venous insufficiency (chronic) (peripheral): Secondary | ICD-10-CM | POA: Diagnosis not present

## 2020-08-04 DIAGNOSIS — I8311 Varicose veins of right lower extremity with inflammation: Secondary | ICD-10-CM | POA: Diagnosis not present

## 2020-08-04 DIAGNOSIS — I890 Lymphedema, not elsewhere classified: Secondary | ICD-10-CM | POA: Diagnosis not present

## 2020-08-04 DIAGNOSIS — I87391 Chronic venous hypertension (idiopathic) with other complications of right lower extremity: Secondary | ICD-10-CM | POA: Diagnosis not present

## 2020-08-20 ENCOUNTER — Other Ambulatory Visit (HOSPITAL_COMMUNITY): Payer: Self-pay | Admitting: Internal Medicine

## 2020-08-20 DIAGNOSIS — E2839 Other primary ovarian failure: Secondary | ICD-10-CM

## 2020-08-20 DIAGNOSIS — L309 Dermatitis, unspecified: Secondary | ICD-10-CM | POA: Diagnosis not present

## 2020-08-20 DIAGNOSIS — I872 Venous insufficiency (chronic) (peripheral): Secondary | ICD-10-CM | POA: Diagnosis not present

## 2020-08-20 DIAGNOSIS — R609 Edema, unspecified: Secondary | ICD-10-CM | POA: Diagnosis not present

## 2020-08-20 DIAGNOSIS — N390 Urinary tract infection, site not specified: Secondary | ICD-10-CM | POA: Diagnosis not present

## 2020-08-20 DIAGNOSIS — L739 Follicular disorder, unspecified: Secondary | ICD-10-CM | POA: Diagnosis not present

## 2020-08-20 DIAGNOSIS — Z79899 Other long term (current) drug therapy: Secondary | ICD-10-CM | POA: Diagnosis not present

## 2020-08-21 DIAGNOSIS — G894 Chronic pain syndrome: Secondary | ICD-10-CM | POA: Diagnosis not present

## 2020-08-21 DIAGNOSIS — M1991 Primary osteoarthritis, unspecified site: Secondary | ICD-10-CM | POA: Diagnosis not present

## 2020-09-15 DIAGNOSIS — I83811 Varicose veins of right lower extremities with pain: Secondary | ICD-10-CM | POA: Diagnosis not present

## 2020-09-15 DIAGNOSIS — I8311 Varicose veins of right lower extremity with inflammation: Secondary | ICD-10-CM | POA: Diagnosis not present

## 2020-09-24 DIAGNOSIS — M25571 Pain in right ankle and joints of right foot: Secondary | ICD-10-CM | POA: Diagnosis not present

## 2020-10-07 DIAGNOSIS — I8311 Varicose veins of right lower extremity with inflammation: Secondary | ICD-10-CM | POA: Diagnosis not present

## 2020-10-21 DIAGNOSIS — M1991 Primary osteoarthritis, unspecified site: Secondary | ICD-10-CM | POA: Diagnosis not present

## 2020-10-21 DIAGNOSIS — G894 Chronic pain syndrome: Secondary | ICD-10-CM | POA: Diagnosis not present

## 2020-10-28 DIAGNOSIS — M79671 Pain in right foot: Secondary | ICD-10-CM | POA: Diagnosis not present

## 2020-10-28 DIAGNOSIS — M19071 Primary osteoarthritis, right ankle and foot: Secondary | ICD-10-CM | POA: Diagnosis not present

## 2020-10-28 DIAGNOSIS — M79672 Pain in left foot: Secondary | ICD-10-CM | POA: Diagnosis not present

## 2020-11-04 DIAGNOSIS — I8311 Varicose veins of right lower extremity with inflammation: Secondary | ICD-10-CM | POA: Diagnosis not present

## 2020-11-04 DIAGNOSIS — M7981 Nontraumatic hematoma of soft tissue: Secondary | ICD-10-CM | POA: Diagnosis not present

## 2020-11-21 DIAGNOSIS — G894 Chronic pain syndrome: Secondary | ICD-10-CM | POA: Diagnosis not present

## 2020-11-21 DIAGNOSIS — M1991 Primary osteoarthritis, unspecified site: Secondary | ICD-10-CM | POA: Diagnosis not present

## 2020-11-28 DIAGNOSIS — M79672 Pain in left foot: Secondary | ICD-10-CM | POA: Diagnosis not present

## 2020-11-28 DIAGNOSIS — M79671 Pain in right foot: Secondary | ICD-10-CM | POA: Diagnosis not present

## 2020-12-01 ENCOUNTER — Encounter: Payer: Self-pay | Admitting: Neurology

## 2020-12-03 DIAGNOSIS — K219 Gastro-esophageal reflux disease without esophagitis: Secondary | ICD-10-CM | POA: Diagnosis not present

## 2020-12-03 DIAGNOSIS — M1991 Primary osteoarthritis, unspecified site: Secondary | ICD-10-CM | POA: Diagnosis not present

## 2020-12-05 ENCOUNTER — Other Ambulatory Visit: Payer: Self-pay

## 2020-12-05 DIAGNOSIS — R202 Paresthesia of skin: Secondary | ICD-10-CM

## 2020-12-20 DIAGNOSIS — G894 Chronic pain syndrome: Secondary | ICD-10-CM | POA: Diagnosis not present

## 2020-12-20 DIAGNOSIS — M1991 Primary osteoarthritis, unspecified site: Secondary | ICD-10-CM | POA: Diagnosis not present

## 2020-12-29 NOTE — Progress Notes (Signed)
Primary Cardiologist : Bronson Ing new to me   SUBJECTIVE: Natasha Wright is a 60 y.o. female with a history of chest pain, tachycardia, and palpitations.   She has a history of multiple cervical spine surgeries and has markedly diminished range of motion. She is on disability. She also has lower back pain, hip pain, diffuse osteoarthritic pain, and bilateral feet pain and has undergone surgeries in both of her feet.Had some cellulitis in right  foot /ankle July after ganglion cyst removal Rx with doxycyline   She has lots of stress On her 3rd marriage Adult son Natasha Wright with [name] Syndrome has cardiac complications   Has had benign SSCP and palpitations in past with normal echo in 2018 and normal myovue 06/05/20 EF 53%   Still with PTSD issues and sons health He aspirates and had a trach last year and was in hospital for 30 plus Days 2 years ago    Soc Hx: Her husband is retired from the [employer]. This is her third marriage. They have been married x 17 yrs.  She has an adult son with Down's syndrome Natasha Wright) who has several cardiac issues.  Review of Systems: As per "subjective", otherwise negative.  Allergies  Allergen Reactions  . Penicillins Shortness Of Breath and Rash    Has patient had a PCN reaction causing immediate rash, facial/tongue/throat swelling, SOB or lightheadedness with hypotension: Yes Has patient had a PCN reaction causing severe rash involving mucus membranes or skin necrosis: No Has patient had a PCN reaction that required hospitalization Yes Has patient had a PCN reaction occurring within the last 10 years: No If all of the above answers are "NO", then may proceed with Cephalosporin use.   . Sulfamethoxazole-Trimethoprim Shortness Of Breath and Rash  . Adhesive [Tape]     Rips off skin, paper tape is ok  . Codeine     REACTION: gi upset, skin crawling  . Fentanyl     REACTION: skin crawling, nausea, passed out  . Morphine     REACTION:  GI upset, skin crawling  . Nsaids     Upset stomach  . Influenza Vaccines Rash  . Latex Rash    Current Outpatient Medications  Medication Sig Dispense Refill  . acetaminophen (TYLENOL) 650 MG CR tablet Take 1,300 mg by mouth 2 (two) times daily as needed for pain.    . citalopram (CELEXA) 20 MG tablet Take 1 tablet (20 mg total) by mouth 2 (two) times daily. (Patient taking differently: Take 10-20 mg by mouth 2 (two) times daily. Takes 10mg  in am and 20mg  in pm) 180 tablet 3  . diazepam (VALIUM) 10 MG tablet Take 10 mg by mouth every 8 (eight) hours as needed (muscle spasms).     . dicyclomine (BENTYL) 20 MG tablet Take 20 mg by mouth 4 (four) times daily as needed for spasms.    [name] diltiazem (CARDIZEM) 60 MG tablet Take 1 tablet (60 mg total) by mouth 2 (two) times daily. 180 tablet 3  . estradiol (ESTRACE) 0.5 MG tablet Take 0.5 mg by mouth at bedtime.     . fluticasone (FLONASE) 50 MCG/ACT nasal spray Place 1 spray into both nostrils 2 (two) times daily.    . furosemide (LASIX) 20 MG tablet Take 20 mg by mouth every other day.    . ipratropium (ATROVENT) 0.06 % nasal spray Place 2 sprays into both nostrils daily.    . Multiple Vitamin (MULTIVITAMIN WITH MINERALS) TABS tablet Take  1 tablet by mouth daily.    . pantoprazole (PROTONIX) 40 MG tablet TAKE 1 TABLET BY MOUTH 30 MINUTES PRIOR TO MEALS TWICE DAILY 60 tablet 11   No current facility-administered medications for this visit.    Past Medical History:  Diagnosis Date  . Anxiety   . Arthritis   . B12 deficiency    h/o  . Family history of heart disease   . GERD (gastroesophageal reflux disease)   . Hypoglycemia   . IBS (irritable bowel syndrome)   . Lumbar disc disease   . OA (osteoarthritis)   . PONV (postoperative nausea and vomiting)    needs scop patch  . Rectocele   . Shingles   . Spinal stenosis in cervical region     Past Surgical History:  Procedure Laterality Date  . ABDOMINAL HYSTERECTOMY  1992  .  ARTHRODESIS FOOT WITH WEIL OSTEOTOMY Left 05/09/2014   Procedure: LEFT FIRST TARSAL METATARSAL  ARTHRODESIS; LEFT SECOND WEIL OSTEOTOMY;  Surgeon: Wylene Simmer, MD;  Location: [place];  Service: Orthopedics;  Laterality: Left;  . BIOPSY  07/11/2018   Procedure: BIOPSY;  Surgeon: Danie Binder, MD;  [facility];  Service: Endoscopy;;  duodenal biopsy , gastric biopsy   . BUNIONECTOMY WITH HAMMERTOE RECONSTRUCTION Left 05/09/2014   Procedure: LEFT MODIFIED MCBRIDE BUNIONECTOMY; LEFT SECOND HAMMER TOE CORRECTION;  Surgeon: Wylene Simmer, MD;  Location: [place];  Service: Orthopedics;  Laterality: Left;  . CERVICAL SPINE SURGERY  2009  . CHOLECYSTECTOMY  2004   lap choli  . COLONOSCOPY  07/08/2003   RMR: Left-sided diverticula, remainder of colonic mucosa and terminal ileum appeared normal.. Minimally friable internal hemorrhoids, otherwise normal rectum  . COLONOSCOPY N/A 04/30/2013   Dr. Oneida Alar: mild diverticulosis in descending and sigmoid colon, internal hemorrhoids.   [name] DILATION AND CURETTAGE OF UTERUS    . ESOPHAGOGASTRODUODENOSCOPY (EGD) WITH PROPOFOL N/A 07/11/2018   Procedure: ESOPHAGOGASTRODUODENOSCOPY (EGD) WITH PROPOFOL;  Surgeon: Danie Binder, MD;  [facility];  Service: Endoscopy;  Laterality: N/A;  9:30am  . NM MYOCAR PERF WALL MOTION  10/2011   bruce myoview - breast attenuation noted in anterior region; EF 65%; no ischemia/infarct/scar; low risk  . SAVORY DILATION N/A 07/11/2018   Procedure: SAVORY DILATION;  Surgeon: Danie Binder, MD;  [facility];  Service: Endoscopy;  Laterality: N/A;  . TONSILLECTOMY  1980  . TUBAL LIGATION      Social History   Socioeconomic History  . Marital status: Married    Spouse name: Mikeal Hawthorne  . Number of children: 1  . Years of education: 76  . Highest education level: Not on file  Occupational History  . Occupation: Unemployed    Employer: [employer]     Employer: UNEMPLOYED  Tobacco Use  . Smoking status: Former Smoker    Packs/day: 0.50    Years: 40.00    Pack years: 20.00    Types: Cigarettes    Quit date: 12/24/2013    Years since quitting: 7.0  . Smokeless tobacco: Never Used  . Tobacco comment: using vape as aid to sustain from smoking  Vaping Use  . Vaping Use: Never used  Substance and Sexual Activity  . Alcohol use: No  . Drug use: No  . Sexual activity: Not on file  Other Topics Concern  . Not on file  Social History Narrative   One son, Down syndrome,is right handed ,resides with son and husband  Social Determinants of Health   Financial Resource Strain: Not on file  Food Insecurity: Not on file  Transportation Needs: Not on file  Physical Activity: Not on file  Stress: Not on file  Social Connections: Not on file  Intimate Partner Violence: Not on file    Barbarann Ehlers, RN was present throughout the entirety of the encounter.  Vitals:   01/06/21 1016  BP: 118/78  Pulse: 88  SpO2: 97%  Weight: 84.4 kg  Height: 5\' 8"  (1.727 m)    Wt Readings from Last 3 Encounters:  01/06/21 84.4 kg  06/05/20 78.9 kg  01/31/20 78.9 kg     PHYSICAL EXAM BP 118/78   Pulse 88   Ht 5\' 8"  (1.727 m)   Wt 84.4 kg   SpO2 97%   BMI 28.28 kg/m  Affect appropriate Healthy:  appears stated age HEENT: normal Neck supple with no adenopathy JVP normal no bruits no thyromegaly Lungs clear with no wheezing and good diaphragmatic motion Heart:  S1/S2 no murmur, no rub, gallop or click PMI normal Abdomen: benighn, BS positve, no tenderness, no AAA no bruit.  No HSM or HJR Distal pulses intact with no bruits No edema Neuro non-focal Skin warm and dry No muscular weakness  ECG: NSR normal 05/18/18  01/06/2021 NSR rate 81 normal    Labs: Lab Results  Component Value Date/Time   K 4.8 02/10/2018 10:15 AM   BUN 14 02/10/2018 10:15 AM   CREATININE 0.80 10/13/2018 09:47 AM   ALT 18 02/10/2018 10:15 AM   TSH 1.337  03/30/2017 03:51 PM   TSH 1.382 12/06/2008 12:00 AM   HGB 14.6 02/10/2018 10:15 AM     Lipids: Lab Results  Component Value Date/Time   LDLCALC 87 06/11/2009 11:47 PM   CHOL 195 06/11/2009 11:47 PM   TRIG 238 (H) 06/11/2009 11:47 PM   HDL 60 06/11/2009 11:47 PM      Prior CV studies:   The following studies were reviewed today:  Echo 04/11/17:  Study Conclusions  - Left ventricle: The cavity size was normal. Wall thickness was increased in a pattern of mild LVH. Systolic function was normal. The estimated ejection fraction was in the range of 55% to 60%. Wall motion was normal; there were no regional wall motion abnormalities. There was a borderline abnormality in the ratio of early to atrial left ventricular filling.   Myovue:  06/05/20:   Study Highlights   There was no ST segment deviation noted during stress.  Nuclear stress EF: 53%. The left ventricular ejection fraction is normal (55-65%). Visually, the EF is higher than the computer calculated 53%.  This is a low risk study. There is no evidence of ischemia or previous infarction .  The study is normal.      ASSESSMENT AND PLAN:  1. Palpitations: benign continue cardizem Also called in PRN inderal if she has acute symptoms  2. Chest pain:Atypical normal myovue in 2018 and most recently 06/05/20 observe  3. Anxiety and stress:Controlled on citalopram 10 mg q am and 20 mg q pm.   Disposition: Follow up in a year    Kate Sable, M.D., F.A.C.C.

## 2021-01-06 ENCOUNTER — Encounter: Payer: Self-pay | Admitting: Cardiovascular Disease

## 2021-01-06 ENCOUNTER — Ambulatory Visit: Payer: Medicare Other | Admitting: Cardiovascular Disease

## 2021-01-06 ENCOUNTER — Other Ambulatory Visit: Payer: Self-pay

## 2021-01-06 VITALS — BP 118/78 | HR 88 | Ht 68.0 in | Wt 186.0 lb

## 2021-01-06 DIAGNOSIS — R079 Chest pain, unspecified: Secondary | ICD-10-CM | POA: Diagnosis not present

## 2021-01-06 DIAGNOSIS — R002 Palpitations: Secondary | ICD-10-CM

## 2021-01-06 MED ORDER — PROPRANOLOL HCL 10 MG PO TABS: ORAL_TABLET | ORAL | 6 refills | Status: DC

## 2021-01-06 NOTE — Patient Instructions (Signed)
Medication Instructions:  Take Inderal 10 mg daily as needed for palpitations   *If you need a refill on your cardiac medications before your next appointment, please call your pharmacy*   Lab Work: None today If you have labs (blood work) drawn today and your tests are completely normal, you will receive your results only by: [name] MyChart Message (if you have MyChart) OR . A paper copy in the mail If you have any lab test that is abnormal or we need to change your treatment, we will call you to review the results.   Testing/Procedures: None today   Follow-Up: At [facility], you and your health needs are our priority.  As part of our continuing mission to provide you with exceptional heart care, we have created designated Provider Care Teams.  These Care Teams include your primary Cardiologist (physician) and Advanced Practice Providers (APPs -  Physician Assistants and Nurse Practitioners) who all work together to provide you with the care you need, when you need it.  We recommend signing up for the patient portal called "MyChart".  Sign up information is provided on this After Visit Summary.  MyChart is used to connect with patients for Virtual Visits (Telemedicine).  Patients are able to view lab/test results, encounter notes, upcoming appointments, etc.  Non-urgent messages can be sent to your provider as well.   To learn more about what you can do with MyChart, go to [email or web address].    Your next appointment:   12 month(s)  The format for your next appointment:   In Person  Provider:   Jenkins Rouge, MD   Other Instructions None      Thank you for choosing [facility] !

## 2021-01-07 ENCOUNTER — Ambulatory Visit: Payer: Medicare Other | Admitting: Neurology

## 2021-01-07 DIAGNOSIS — M5417 Radiculopathy, lumbosacral region: Secondary | ICD-10-CM

## 2021-01-07 DIAGNOSIS — R202 Paresthesia of skin: Secondary | ICD-10-CM | POA: Diagnosis not present

## 2021-01-07 NOTE — Procedures (Signed)
[facility]  [address], [address]  [place], [place] [address] Tel: [phone] Fax:  [phone] Test Date:  01/07/2021  Patient: Natasha Wright DOB: 1961/07/06 Physician: Narda Amber, DO  Sex: Female Height: 5\' 8"  Ref Phys: Rhina Brackett, MD  ID#: [id number]   Technician:    Patient Complaints: This is a 60 year old female referred for evaluation of bilateral feet paresthesias and pain, worse on the right foot.  NCV & EMG Findings: Extensive electrodiagnostic testing of the right lower extremity and additional studies of the left shows:  1. Bilateral sural and superficial peroneal sensory responses are within normal limits. 2. Right peroneal (EDB) and tibial motor responses showed reduced amplitude (R1.6, R1.0 mV).  Right peroneal motor response at the tibialis anterior is within normal limits.  Left peroneal and tibial motor responses are within normal limits. 3. Tibial H reflex study is mildly prolonged on the right and normal on the left. 4. Chronic motor axonal loss changes are seen affecting the right gastrocnemius and biceps femoris short head muscles, without accompanying active denervation.  These findings are not present in the left lower extremity.  Impression: 1. Chronic S1 radiculopathy affecting the right lower extremity, mild. 2. There is no evidence of a sensorimotor polyneuropathy affecting the lower extremities.   ___________________________ Narda Amber, DO    Nerve Conduction Studies Anti Sensory Summary Table   Stim Site NR Peak (ms) Norm Peak (ms) P-T Amp (V) Norm P-T Amp  Left Sup Peroneal Anti Sensory (Ant Lat Mall)  32C  12 cm    3.1 <4.6 9.6 >3  Right Sup Peroneal Anti Sensory (Ant Lat Mall)  32C  12 cm    2.8 <4.6 6.6 >3  Left Sural Anti Sensory (Lat Mall)  32C  Calf    4.3 <4.6 8.0 >3  Right Sural Anti Sensory (Lat Mall)  32C  Calf    3.2 <4.6 7.2 >3   Motor Summary Table   Stim Site NR Onset (ms) Norm Onset (ms) O-P  Amp (mV) Norm O-P Amp Site1 Site2 Delta-0 (ms) Dist (cm) Vel (m/s) Norm Vel (m/s)  Left Peroneal Motor (Ext Dig Brev)  32C  Ankle    5.2 <6.0 2.9 >2.5 B Fib Ankle 7.6 39.0 51 >40  B Fib    12.8  2.8  Poplt B Fib 1.5 9.0 60 >40  Poplt    14.3  2.7         Right Peroneal Motor (Ext Dig Brev)  32C  Ankle    4.3 <6.0 1.6 >2.5 B Fib Ankle 7.7 35.0 45 >40  B Fib    12.0  1.4  Poplt B Fib 2.0 10.0 50 >40  Poplt    14.0  1.3         Left Peroneal TA Motor (Tib Ant)  32C  Fib Head    2.8 <4.5 4.5 >3 Poplit Fib Head 1.2 8.0 67 >40  Poplit    4.0  4.4         Right Peroneal TA Motor (Tib Ant)  32C  Fib Head    2.7 <4.5 3.9 >3 Poplit Fib Head 1.2 7.0 58 >40  Poplit    3.9  3.8         Left Tibial Motor (Abd Hall Brev)  32C  Ankle    3.0 <6.0 5.0 >4 Knee Ankle 9.8 43.0 44 >40  Knee    12.8  3.7         Right  Tibial Motor (Abd Hall Brev)  32C  Ankle    2.6 <6.0 1.0 >4 Knee Ankle 9.9 44.0 44 >40  Knee    12.5  0.4          H Reflex Studies   NR H-Lat (ms) Lat Norm (ms) L-R H-Lat (ms)  Left Tibial (Gastroc)  32C     34.92 <35 0.00  Right Tibial (Gastroc)  32C     35.92 <35 0.00   EMG   Side Muscle Ins Act Fibs Psw Fasc Number Recrt Dur Dur. Amp Amp. Poly Poly. Comment  Right AntTibialis Nml Nml Nml Nml Nml Nml Nml Nml Nml Nml Nml Nml N/A  Right Gastroc Nml Nml Nml Nml 2- Rapid Some 1+ Some 1+ Some 1+ N/A  Right Flex Dig Long Nml Nml Nml Nml Nml Nml Nml Nml Nml Nml Nml Nml N/A  Right RectFemoris Nml Nml Nml Nml Nml Nml Nml Nml Nml Nml Nml Nml N/A  Right GluteusMed Nml Nml Nml Nml Nml Nml Nml Nml Nml Nml Nml Nml N/A  Right BicepsFemS Nml Nml Nml Nml 1- Rapid Some 1+ Some 1+ Some 1+ N/A  Left AntTibialis Nml Nml Nml Nml Nml Nml Nml Nml Nml Nml Nml Nml N/A  Left Gastroc Nml Nml Nml Nml Nml Nml Nml Nml Nml Nml Nml Nml N/A  Left RectFemoris Nml Nml Nml Nml Nml Nml Nml Nml Nml Nml Nml Nml N/A  Left BicepsFemS Nml Nml Nml Nml Nml Nml Nml Nml Nml Nml Nml Nml N/A      Waveforms:

## 2021-01-12 ENCOUNTER — Other Ambulatory Visit: Payer: Self-pay | Admitting: Orthopedic Surgery

## 2021-01-12 DIAGNOSIS — M545 Low back pain, unspecified: Secondary | ICD-10-CM

## 2021-01-12 DIAGNOSIS — M5416 Radiculopathy, lumbar region: Secondary | ICD-10-CM | POA: Diagnosis not present

## 2021-01-15 DIAGNOSIS — L820 Inflamed seborrheic keratosis: Secondary | ICD-10-CM | POA: Diagnosis not present

## 2021-01-22 ENCOUNTER — Ambulatory Visit
Admission: RE | Admit: 2021-01-22 | Discharge: 2021-01-22 | Disposition: A | Discharge status: Home / Self Care | Payer: Medicare Other | Source: Ambulatory Visit | Attending: Orthopedic Surgery | Admitting: Orthopedic Surgery

## 2021-01-22 DIAGNOSIS — M545 Low back pain, unspecified: Secondary | ICD-10-CM | POA: Diagnosis not present

## 2021-01-27 ENCOUNTER — Other Ambulatory Visit: Payer: Self-pay | Admitting: Internal Medicine

## 2021-01-27 DIAGNOSIS — Z1231 Encounter for screening mammogram for malignant neoplasm of breast: Secondary | ICD-10-CM

## 2021-01-27 DIAGNOSIS — M5416 Radiculopathy, lumbar region: Secondary | ICD-10-CM | POA: Diagnosis not present

## 2021-01-29 ENCOUNTER — Other Ambulatory Visit: Payer: Medicare Other

## 2021-02-06 DIAGNOSIS — M5416 Radiculopathy, lumbar region: Secondary | ICD-10-CM | POA: Diagnosis not present

## 2021-02-10 ENCOUNTER — Other Ambulatory Visit (HOSPITAL_COMMUNITY): Payer: Medicare Other

## 2021-02-13 ENCOUNTER — Other Ambulatory Visit: Payer: Self-pay | Admitting: Orthopedic Surgery

## 2021-02-13 DIAGNOSIS — M533 Sacrococcygeal disorders, not elsewhere classified: Secondary | ICD-10-CM

## 2021-02-18 DIAGNOSIS — M1991 Primary osteoarthritis, unspecified site: Secondary | ICD-10-CM | POA: Diagnosis not present

## 2021-02-18 DIAGNOSIS — G894 Chronic pain syndrome: Secondary | ICD-10-CM | POA: Diagnosis not present

## 2021-02-25 ENCOUNTER — Ambulatory Visit (HOSPITAL_COMMUNITY)
Admission: RE | Admit: 2021-02-25 | Discharge: 2021-02-25 | Disposition: A | Discharge status: Home / Self Care | Payer: Medicare Other | Source: Ambulatory Visit | Attending: Internal Medicine | Admitting: Internal Medicine

## 2021-02-25 DIAGNOSIS — E2839 Other primary ovarian failure: Secondary | ICD-10-CM | POA: Insufficient documentation

## 2021-02-25 DIAGNOSIS — M8589 Other specified disorders of bone density and structure, multiple sites: Secondary | ICD-10-CM | POA: Diagnosis not present

## 2021-03-04 ENCOUNTER — Other Ambulatory Visit: Payer: Self-pay | Admitting: Orthopedic Surgery

## 2021-03-04 ENCOUNTER — Ambulatory Visit
Admission: RE | Admit: 2021-03-04 | Discharge: 2021-03-04 | Disposition: A | Discharge status: Home / Self Care | Payer: Medicare Other | Source: Ambulatory Visit | Attending: Orthopedic Surgery | Admitting: Orthopedic Surgery

## 2021-03-04 ENCOUNTER — Other Ambulatory Visit: Payer: Self-pay

## 2021-03-04 DIAGNOSIS — M79604 Pain in right leg: Secondary | ICD-10-CM | POA: Diagnosis not present

## 2021-03-04 DIAGNOSIS — M533 Sacrococcygeal disorders, not elsewhere classified: Secondary | ICD-10-CM

## 2021-03-04 DIAGNOSIS — G8929 Other chronic pain: Secondary | ICD-10-CM | POA: Diagnosis not present

## 2021-03-09 DIAGNOSIS — M533 Sacrococcygeal disorders, not elsewhere classified: Secondary | ICD-10-CM | POA: Diagnosis not present

## 2021-03-13 ENCOUNTER — Ambulatory Visit
Admission: RE | Admit: 2021-03-13 | Discharge: 2021-03-13 | Disposition: A | Discharge status: Home / Self Care | Payer: Medicare Other | Source: Ambulatory Visit

## 2021-03-13 ENCOUNTER — Other Ambulatory Visit: Payer: Self-pay

## 2021-03-13 DIAGNOSIS — Z1231 Encounter for screening mammogram for malignant neoplasm of breast: Secondary | ICD-10-CM | POA: Diagnosis not present

## 2021-03-17 DIAGNOSIS — I872 Venous insufficiency (chronic) (peripheral): Secondary | ICD-10-CM | POA: Diagnosis not present

## 2021-03-17 DIAGNOSIS — M1991 Primary osteoarthritis, unspecified site: Secondary | ICD-10-CM | POA: Diagnosis not present

## 2021-03-17 DIAGNOSIS — R609 Edema, unspecified: Secondary | ICD-10-CM | POA: Diagnosis not present

## 2021-03-31 DIAGNOSIS — M47818 Spondylosis without myelopathy or radiculopathy, sacral and sacrococcygeal region: Secondary | ICD-10-CM | POA: Diagnosis not present

## 2021-04-09 ENCOUNTER — Encounter: Payer: Self-pay | Admitting: Neurology

## 2021-04-09 ENCOUNTER — Other Ambulatory Visit: Payer: Self-pay

## 2021-04-09 ENCOUNTER — Ambulatory Visit: Payer: Medicare Other | Admitting: Neurology

## 2021-04-09 VITALS — BP 132/74 | HR 92 | Ht 67.0 in | Wt 187.0 lb

## 2021-04-09 DIAGNOSIS — R200 Anesthesia of skin: Secondary | ICD-10-CM

## 2021-04-09 DIAGNOSIS — Z9889 Other specified postprocedural states: Secondary | ICD-10-CM

## 2021-04-09 DIAGNOSIS — R292 Abnormal reflex: Secondary | ICD-10-CM

## 2021-04-09 NOTE — Progress Notes (Signed)
[facility] Clinic Note - Initial Visit   Date: 04/09/21  Natasha Wright MRN: [id number] DOB: [birth year]   Dear Dr. Lynann Bologna:  Thank you for your kind referral of Natasha Wright for consultation of right foot numbness. Although her history is well known to you, please allow [place] to reiterate it for the purpose of our medical record. The patient was accompanied to the clinic by self.    History of Present Illness: Natasha Wright is a 60 y.o. right-handed female with cervical surgery x 2, depression, hypertension, GERD, presenting for evaluation of right foot numbness.  She had ganglion cyst removal in July 4098 which was complicated by cellulitis.  She developed numbness over the right foot, which is worse over the lateral foot and sole of the foot into the heel.  Prolonged sitting and standing makes it worse.  Nothing alleviates her numbness. She also complains of weakness on the right foot, with difficulty moving her toes, ankle, and the leg.  She has NCS/EMG of the legs which shows mild S1 radiculopathy on the right, no evidence of neuropathy.  There was no compressive pathology on MRI lumbar spine, however.  She is referred for my opinion.     She sees Dr. Jacelyn Grip for pain management and will be having RFA due to chronic low back pain. She is on disability.  She has generalized pain of the low back, hips, diffuse arthritis pain, and bilateral feet pain.     Out-side paper records, electronic medical record, and images have been reviewed where available and summarized as:  NCS/EMG of the right leg 01/07/2021: 1. Chronic S1 radiculopathy affecting the right lower extremity, mild. 2. There is no evidence of a sensorimotor polyneuropathy affecting the lower extremities.  MRI lumbar spine 01/23/2021: No significant spinal canal narrowing or cord impingement. Mild right L4-5 neural foraminal narrowing. Minimal L5-S1 disc bulge grazing the exiting right L5 nerve  root.   Past Medical History:  Diagnosis Date  . Anxiety   . Arthritis   . B12 deficiency    h/o  . Family history of heart disease   . GERD (gastroesophageal reflux disease)   . Hypoglycemia   . IBS (irritable bowel syndrome)   . Lumbar disc disease   . OA (osteoarthritis)   . PONV (postoperative nausea and vomiting)    needs scop patch  . Rectocele   . Shingles   . Spinal stenosis in cervical region     Past Surgical History:  Procedure Laterality Date  . ABDOMINAL HYSTERECTOMY  1992  . ARTHRODESIS FOOT WITH WEIL OSTEOTOMY Left 05/09/2014   Procedure: LEFT FIRST TARSAL METATARSAL  ARTHRODESIS; LEFT SECOND WEIL OSTEOTOMY;  Surgeon: Wylene Simmer, MD;  Location: [place];  Service: Orthopedics;  Laterality: Left;  . BIOPSY  07/11/2018   Procedure: BIOPSY;  Surgeon: Danie Binder, MD;  [facility];  Service: Endoscopy;;  duodenal biopsy , gastric biopsy   . BUNIONECTOMY WITH HAMMERTOE RECONSTRUCTION Left 05/09/2014   Procedure: LEFT MODIFIED MCBRIDE BUNIONECTOMY; LEFT SECOND HAMMER TOE CORRECTION;  Surgeon: Wylene Simmer, MD;  Location: [place];  Service: Orthopedics;  Laterality: Left;  . CERVICAL SPINE SURGERY  2009  . CHOLECYSTECTOMY  2004   lap choli  . COLONOSCOPY  07/08/2003   RMR: Left-sided diverticula, remainder of colonic mucosa and terminal ileum appeared normal.. Minimally friable internal hemorrhoids, otherwise normal rectum  . COLONOSCOPY N/A 04/30/2013   Dr. Oneida Alar: mild diverticulosis in descending  and sigmoid colon, internal hemorrhoids.   [name] DILATION AND CURETTAGE OF UTERUS    . ESOPHAGOGASTRODUODENOSCOPY (EGD) WITH PROPOFOL N/A 07/11/2018   Procedure: ESOPHAGOGASTRODUODENOSCOPY (EGD) WITH PROPOFOL;  Surgeon: Danie Binder, MD;  [facility];  Service: Endoscopy;  Laterality: N/A;  9:30am  . NM MYOCAR PERF WALL MOTION  10/2011   bruce myoview - breast attenuation noted in anterior region; EF 65%; no  ischemia/infarct/scar; low risk  . SAVORY DILATION N/A 07/11/2018   Procedure: SAVORY DILATION;  Surgeon: Danie Binder, MD;  [facility];  Service: Endoscopy;  Laterality: N/A;  . TONSILLECTOMY  1980  . TUBAL LIGATION       Medications:  Outpatient Encounter Medications as of 04/09/2021  Medication Sig  . acetaminophen (TYLENOL) 650 MG CR tablet Take 1,300 mg by mouth 2 (two) times daily as needed for pain.  . citalopram (CELEXA) 20 MG tablet Take 1 tablet (20 mg total) by mouth 2 (two) times daily. (Patient taking differently: Take 10-20 mg by mouth 2 (two) times daily. Takes 10mg  in am and 20mg  in pm)  . diltiazem (CARDIZEM) 60 MG tablet Take 1 tablet (60 mg total) by mouth 2 (two) times daily.  [name] estradiol (ESTRACE) 0.5 MG tablet Take 0.5 mg by mouth at bedtime.   . fluticasone (FLONASE) 50 MCG/ACT nasal spray Place 1 spray into both nostrils 2 (two) times daily.  . furosemide (LASIX) 20 MG tablet Take 20 mg by mouth every other day.  . ipratropium (ATROVENT) 0.06 % nasal spray Place 2 sprays into both nostrils daily.  . Multiple Vitamin (MULTIVITAMIN WITH MINERALS) TABS tablet Take 1 tablet by mouth daily.  . pantoprazole (PROTONIX) 40 MG tablet TAKE 1 TABLET BY MOUTH 30 MINUTES PRIOR TO MEALS TWICE DAILY  . propranolol (INDERAL) 10 MG tablet Take 10 mg daily as needed for palpitations  . diazepam (VALIUM) 10 MG tablet Take 10 mg by mouth every 8 (eight) hours as needed (muscle spasms).  (Patient not taking: Reported on 04/09/2021)  . dicyclomine (BENTYL) 20 MG tablet Take 20 mg by mouth 4 (four) times daily as needed for spasms. (Patient not taking: Reported on 04/09/2021)   No facility-administered encounter medications on file as of 04/09/2021.    Allergies:  Allergies  Allergen Reactions  . Penicillins Shortness Of Breath and Rash    Has patient had a PCN reaction causing immediate rash, facial/tongue/throat swelling, SOB or lightheadedness with hypotension: Yes Has  patient had a PCN reaction causing severe rash involving mucus membranes or skin necrosis: No Has patient had a PCN reaction that required hospitalization Yes Has patient had a PCN reaction occurring within the last 10 years: No If all of the above answers are "NO", then may proceed with Cephalosporin use.   . Sulfamethoxazole-Trimethoprim Shortness Of Breath and Rash  . Codeine Nausea Only and Other (See Comments)    skin crawling  . Fentanyl Nausea Only and Other (See Comments)    skin crawling, passed out  . Morphine Nausea Only and Other (See Comments)    skin crawling  . Adhesive [Tape] Other (See Comments)    Rips off skin, paper tape is ok  . Influenza Vaccines Rash  . Latex Rash  . Nsaids Nausea Only    Family History: Family History  Problem Relation Age of Onset  . Diabetes Mother   . Hypertension Mother   . Hyperlipidemia Mother   . Ulcerative colitis Father   . Breast cancer Maternal Grandmother   .  Hypertension Maternal Grandmother   . Diabetes Maternal Grandmother   . Stroke Maternal Grandmother   . Asthma Maternal Grandmother   . Stroke Maternal Grandfather   . Stroke Paternal Grandmother   . Diabetes Paternal Grandmother   . Heart disease Paternal Grandmother   . Heart disease Paternal Grandfather   . Hyperlipidemia Brother   . Hyperlipidemia Sister   . Hypertension Sister   . Diabetes Sister   . Down syndrome Child   . Heart disease Child   . Colon cancer Neg Hx     Social History: Social History   Tobacco Use  . Smoking status: Former Smoker    Packs/day: 0.50    Years: 40.00    Pack years: 20.00    Types: Cigarettes    Quit date: 12/24/2013    Years since quitting: 7.2  . Smokeless tobacco: Never Used  . Tobacco comment: using vape as aid to sustain from smoking  Vaping Use  . Vaping Use: Never used  Substance Use Topics  . Alcohol use: No  . Drug use: No   Social History   Social History Narrative   One son, Down syndrome,is right  handed ,resides with son and husband   Right handed    Vital Signs:  BP 132/74   Pulse 92   Ht 5\' 7"  (1.702 m)   Wt 187 lb (84.8 kg)   SpO2 95%   BMI 29.29 kg/m    Neurological Exam: MENTAL STATUS including orientation to time, place, person, recent and remote memory, attention span and concentration, language, and fund of knowledge is normal.  Speech is not dysarthric.  CRANIAL NERVES: II:  No visual field defects.  III-IV-VI: Pupils equal round and reactive to light.  Normal conjugate, extra-ocular eye movements in all directions of gaze.  No nystagmus.  No ptosis.   V:  Normal facial sensation.    VII:  Normal facial symmetry and movements.   VIII:  Normal hearing and vestibular function.   IX-X:  Normal palatal movement.   XI:  Normal shoulder shrug and head rotation.   XII:  Normal tongue strength and range of motion, no deviation or fasciculation.  MOTOR: Motor strength in the arms and LLE is 5/5.  There is give-way weakness upon testing the right hip flexors, knee extensors, knee flexors, and very little effort with ankle movement.  It is difficult to determine accurate motor testing in the right leg and foot.   No atrophy, fasciculations or abnormal movements.  No pronator drift.   MSRs:  Right        Left                  brachioradialis 2+  2+  biceps 2+  2+  triceps 2+  2+  patellar 3+  3+  ankle jerk 2+  2+  Hoffman no  no  plantar response down  down   SENSORY:  Reduced sensation to vibration, temperature, and pin prick over the entire right foot.  Sensation normal in the left foot and arms.  COORDINATION/GAIT: Normal finger-to- nose-finger. Finger tapping is normal bilaterally.  Toe tapping is slowed on the right.   Gait narrow based and stable, slow, unassisted.    IMPRESSION: Right foot numbness, unclear etiology.  No evidence of neuropathy on EMG and although there was a mild right S1 radiculopathy on her EMG, compressive pathology was no seen on MRI  lumbar spine.  Her exam is nonphysiological making it difficult to determine  the true degree of weakness in the right leg and foot.  I will check MRI cervical and thoracic spine to see if here is compressive canal stenosis.  Consider vascular studies of the right leg going forward.    Thank you for allowing me to participate in patient's care.  If I can answer any additional questions, I would be pleased to do so.    Sincerely,    Kenneshia Rehm K. Posey Pronto, DO

## 2021-04-09 NOTE — Patient Instructions (Addendum)
MRI cervical spine without contrast  MRI thoracic spine without contrast   

## 2021-04-10 DIAGNOSIS — R600 Localized edema: Secondary | ICD-10-CM | POA: Diagnosis not present

## 2021-04-10 DIAGNOSIS — K219 Gastro-esophageal reflux disease without esophagitis: Secondary | ICD-10-CM | POA: Diagnosis not present

## 2021-04-10 DIAGNOSIS — M1991 Primary osteoarthritis, unspecified site: Secondary | ICD-10-CM | POA: Diagnosis not present

## 2021-04-10 DIAGNOSIS — M1990 Unspecified osteoarthritis, unspecified site: Secondary | ICD-10-CM | POA: Diagnosis not present

## 2021-04-10 DIAGNOSIS — G5771 Causalgia of right lower limb: Secondary | ICD-10-CM | POA: Diagnosis not present

## 2021-04-10 DIAGNOSIS — G629 Polyneuropathy, unspecified: Secondary | ICD-10-CM | POA: Diagnosis not present

## 2021-04-16 DIAGNOSIS — M47818 Spondylosis without myelopathy or radiculopathy, sacral and sacrococcygeal region: Secondary | ICD-10-CM | POA: Diagnosis not present

## 2021-04-17 DIAGNOSIS — M19071 Primary osteoarthritis, right ankle and foot: Secondary | ICD-10-CM | POA: Diagnosis not present

## 2021-04-17 DIAGNOSIS — M25571 Pain in right ankle and joints of right foot: Secondary | ICD-10-CM | POA: Diagnosis not present

## 2021-04-21 NOTE — Progress Notes (Signed)
No PA Needed

## 2021-04-25 ENCOUNTER — Other Ambulatory Visit: Payer: Medicare Other

## 2021-04-30 IMAGING — MG DIGITAL SCREENING BILAT W/ TOMO W/ CAD
8 series · 8 of 24 positions shown · non-contrast
Comparison: Previous exam(s).

CLINICAL DATA: Screening.

EXAM:
DIGITAL SCREENING BILATERAL MAMMOGRAM WITH TOMO AND CAD

[R MLO synth-2D]
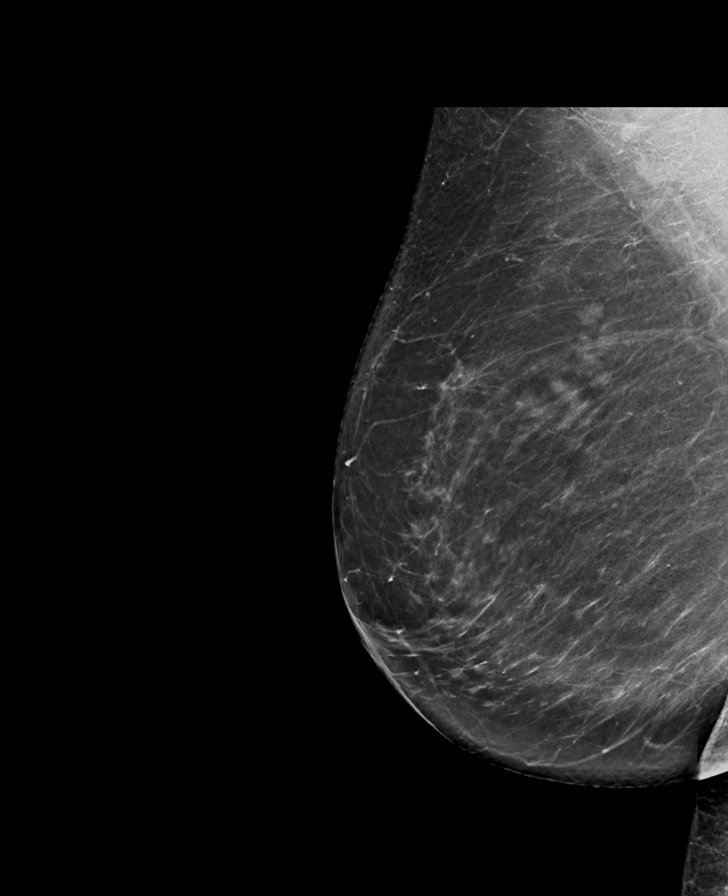

[L CC synth-2D]
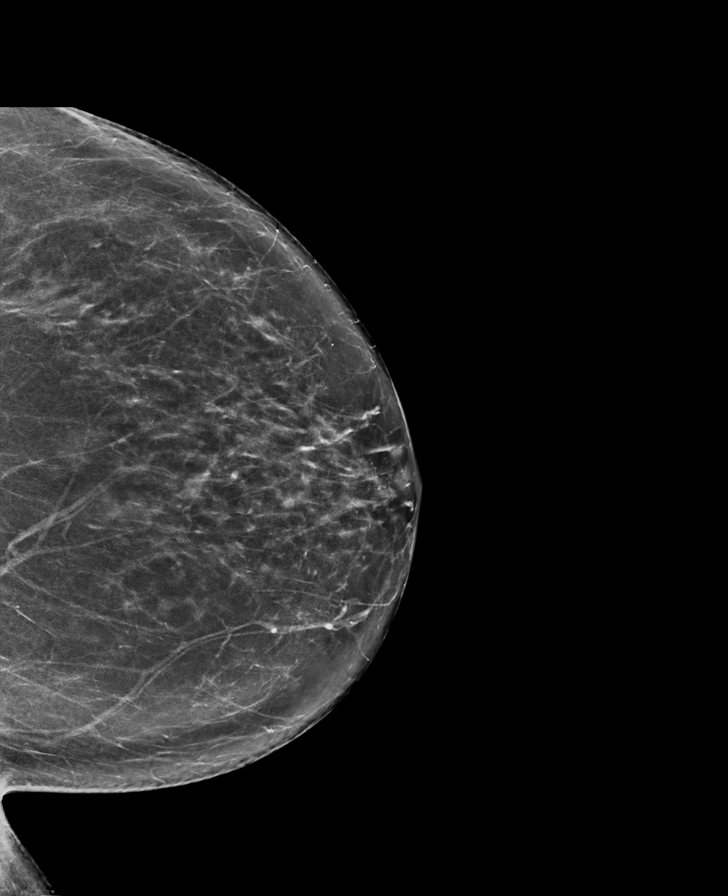

[L MLO synth-2D]
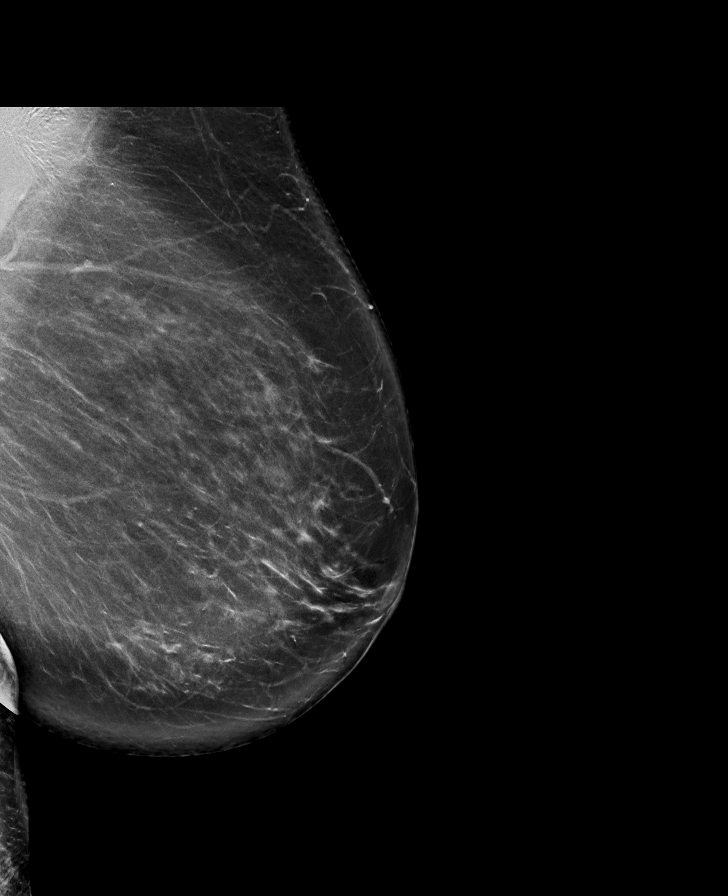

[R CC synth-2D]
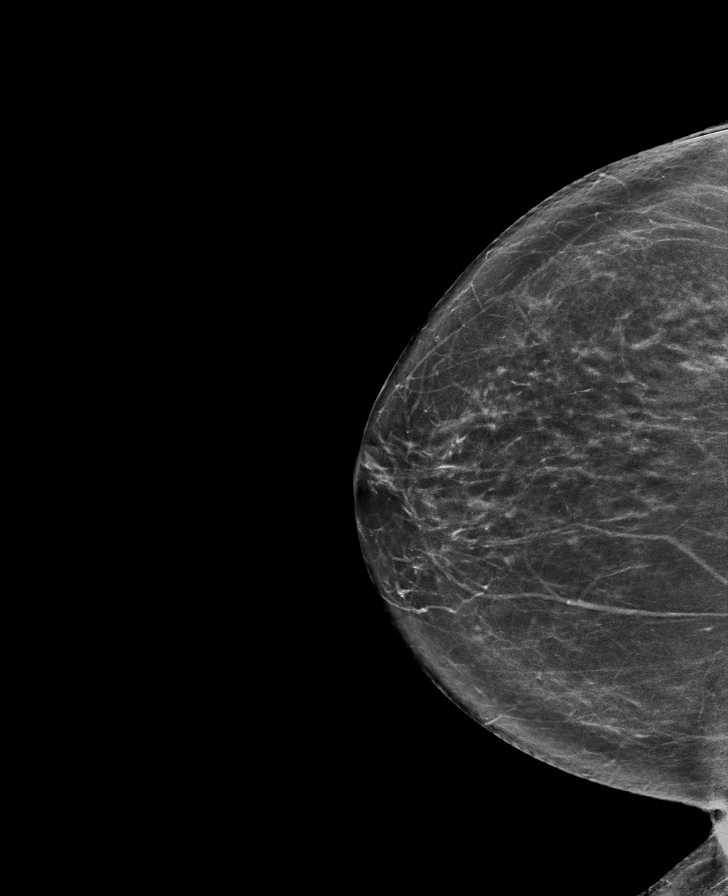

[R MLO tomo · tomo slice 46/91.0]
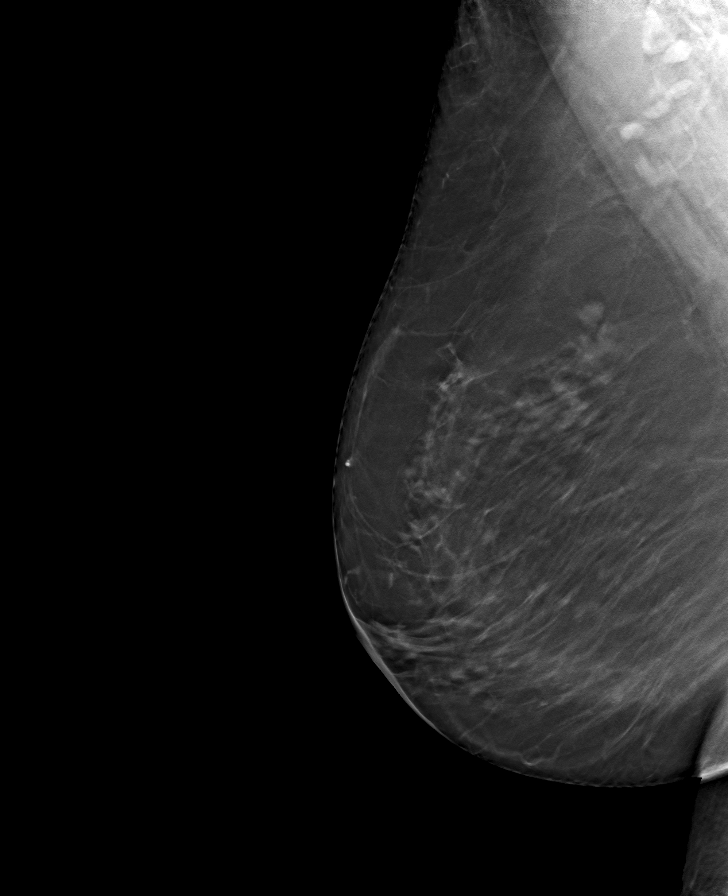

[L MLO tomo · tomo slice 49/97.0]
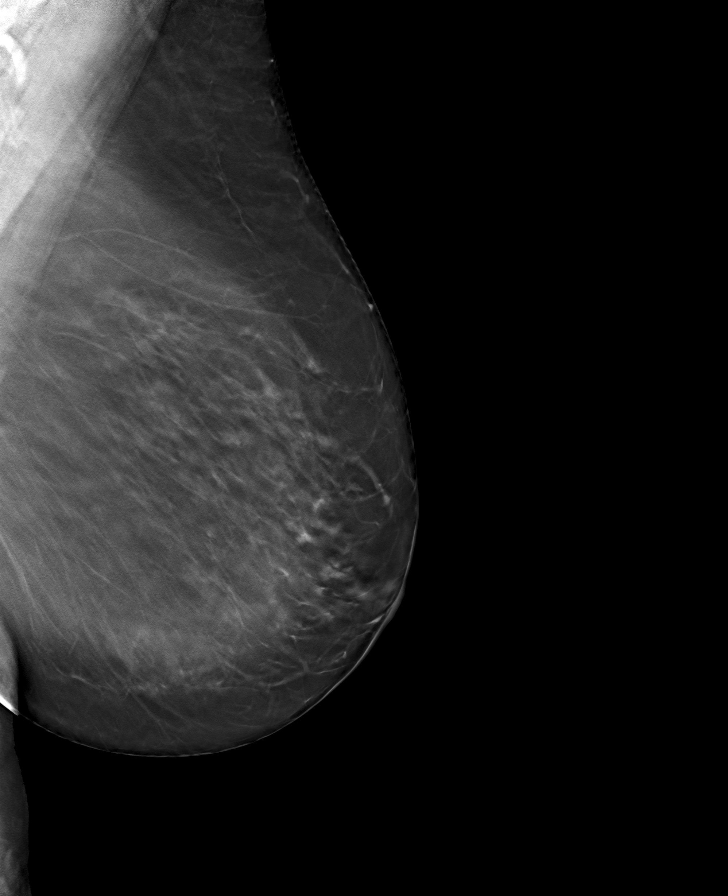

[R CC tomo · tomo slice 39/78.0]
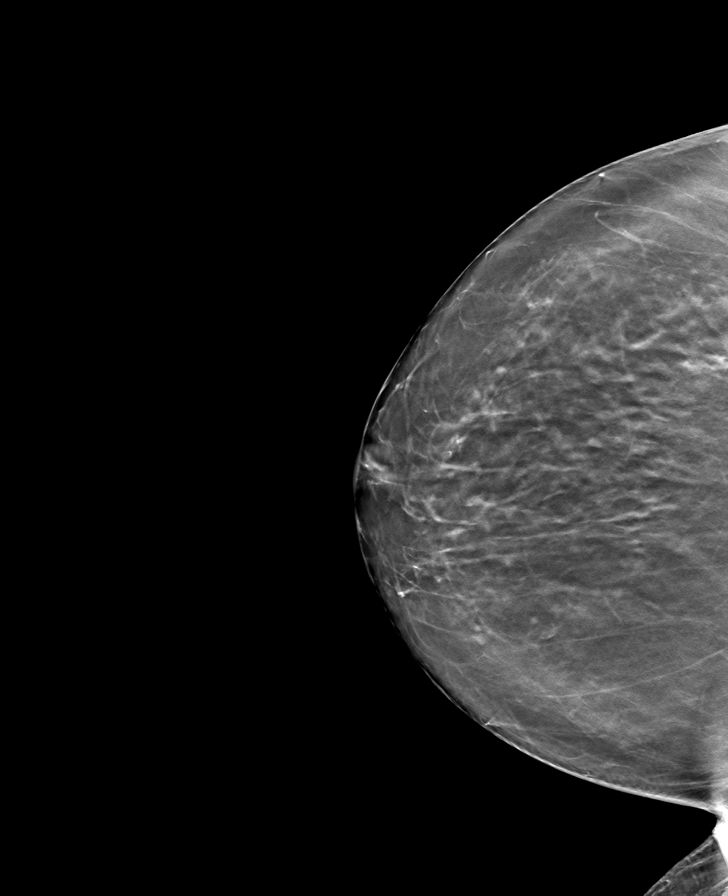

[L CC tomo · tomo slice 40/79.0]
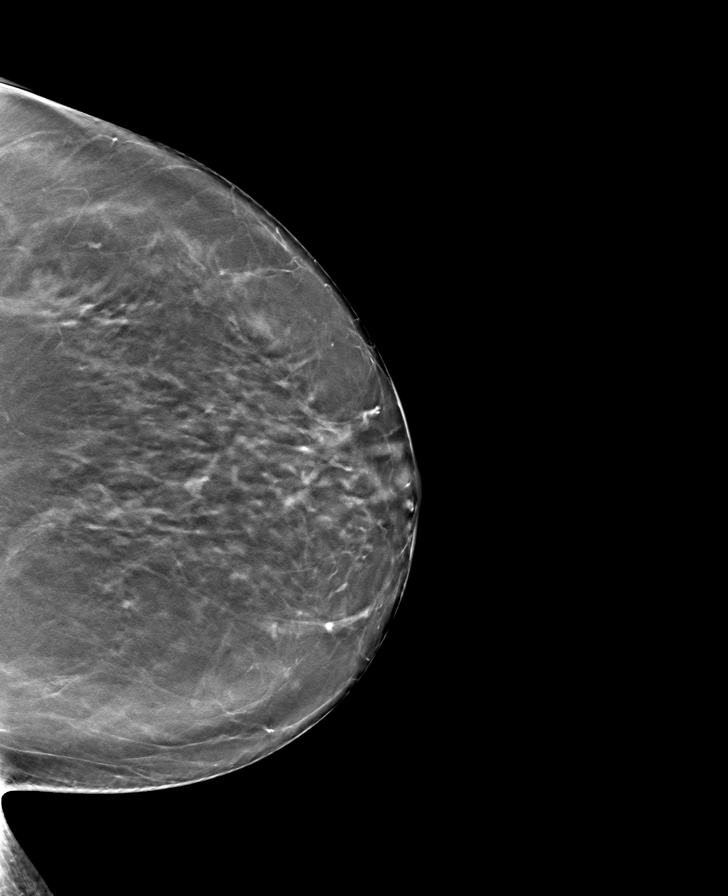

[8 of 24 positions shown; findings below may reference images not displayed]

ACR Breast Density Category b: There are scattered areas of
fibroglandular density.
FINDINGS: There are no findings suspicious for malignancy. Images were
processed with CAD.
IMPRESSION: No mammographic evidence of malignancy. A result letter of this
screening mammogram will be mailed directly to the patient.

RECOMMENDATION:
Screening mammogram in one year. (Code:[id number])

BI-RADS CATEGORY  1: Negative.

## 2021-05-02 ENCOUNTER — Other Ambulatory Visit: Payer: Medicare Other

## 2021-05-03 ENCOUNTER — Ambulatory Visit
Admission: RE | Admit: 2021-05-03 | Discharge: 2021-05-03 | Disposition: A | Discharge status: Home / Self Care | Payer: Medicare Other | Source: Ambulatory Visit | Attending: Neurology | Admitting: Neurology

## 2021-05-03 DIAGNOSIS — R292 Abnormal reflex: Secondary | ICD-10-CM

## 2021-05-03 DIAGNOSIS — R200 Anesthesia of skin: Secondary | ICD-10-CM

## 2021-05-03 DIAGNOSIS — M419 Scoliosis, unspecified: Secondary | ICD-10-CM | POA: Diagnosis not present

## 2021-05-03 DIAGNOSIS — M47812 Spondylosis without myelopathy or radiculopathy, cervical region: Secondary | ICD-10-CM | POA: Diagnosis not present

## 2021-05-03 DIAGNOSIS — D1800 Hemangioma unspecified site: Secondary | ICD-10-CM | POA: Diagnosis not present

## 2021-05-03 DIAGNOSIS — Z9889 Other specified postprocedural states: Secondary | ICD-10-CM

## 2021-05-03 DIAGNOSIS — Z981 Arthrodesis status: Secondary | ICD-10-CM | POA: Diagnosis not present

## 2021-05-03 DIAGNOSIS — M47814 Spondylosis without myelopathy or radiculopathy, thoracic region: Secondary | ICD-10-CM | POA: Diagnosis not present

## 2021-05-03 DIAGNOSIS — M4313 Spondylolisthesis, cervicothoracic region: Secondary | ICD-10-CM | POA: Diagnosis not present

## 2021-05-03 DIAGNOSIS — M47813 Spondylosis without myelopathy or radiculopathy, cervicothoracic region: Secondary | ICD-10-CM | POA: Diagnosis not present

## 2021-05-07 DIAGNOSIS — M47818 Spondylosis without myelopathy or radiculopathy, sacral and sacrococcygeal region: Secondary | ICD-10-CM | POA: Diagnosis not present

## 2021-05-11 DIAGNOSIS — M25512 Pain in left shoulder: Secondary | ICD-10-CM | POA: Diagnosis not present

## 2021-05-13 ENCOUNTER — Telehealth: Payer: Self-pay

## 2021-05-13 NOTE — Telephone Encounter (Signed)
-----   Message from Alda Berthold, DO sent at 05/04/2021 11:07 AM EDT ----- Please inform pt that her MRI of her cervical spine and thoracic spine does not show any nerve impingement which would cause her leg numbness.  The next step is to look at her circulation to the feet.  Please order ABI, if pt agreeable. Thanks.

## 2021-05-13 NOTE — Telephone Encounter (Signed)
Called patient and left message for a call back.  

## 2021-05-15 DIAGNOSIS — M19071 Primary osteoarthritis, right ankle and foot: Secondary | ICD-10-CM | POA: Diagnosis not present

## 2021-05-22 ENCOUNTER — Telehealth: Payer: Self-pay

## 2021-05-22 DIAGNOSIS — M47818 Spondylosis without myelopathy or radiculopathy, sacral and sacrococcygeal region: Secondary | ICD-10-CM | POA: Diagnosis not present

## 2021-05-22 NOTE — Telephone Encounter (Signed)
Called patient and left a message for a call back.  

## 2021-05-22 NOTE — Telephone Encounter (Signed)
-----   Message from Alda Berthold, DO sent at 05/04/2021 11:07 AM EDT ----- Please inform Natasha Wright that her MRI of her cervical spine and thoracic spine does not show any nerve impingement which would cause her leg numbness.  The next step is to look at her circulation to the feet.  Please order ABI, if Natasha Wright agreeable. Thanks.

## 2021-06-02 DIAGNOSIS — I8001 Phlebitis and thrombophlebitis of superficial vessels of right lower extremity: Secondary | ICD-10-CM | POA: Diagnosis not present

## 2021-06-02 NOTE — Telephone Encounter (Signed)
Called patient and informed her of MRI results and recommendations per Dr. Posey Pronto. Patient stated that she has an appt at her pain clinic today and will ask them about the ABI before proceeding with the order. Patient will call if she wants [place] to put the order in. Patient had no further questions or concerns.

## 2021-06-05 ENCOUNTER — Ambulatory Visit: Payer: Medicare Other | Admitting: Neurology

## 2021-06-07 ENCOUNTER — Other Ambulatory Visit: Payer: Self-pay

## 2021-06-07 ENCOUNTER — Ambulatory Visit
Admission: RE | Admit: 2021-06-07 | Discharge: 2021-06-07 | Disposition: A | Discharge status: Home / Self Care | Payer: Medicare Other | Source: Ambulatory Visit | Attending: Family Medicine | Admitting: Family Medicine

## 2021-06-07 VITALS — BP 125/77 | HR 101 | Temp 98.9°F | Resp 18

## 2021-06-07 DIAGNOSIS — L03115 Cellulitis of right lower limb: Secondary | ICD-10-CM | POA: Diagnosis not present

## 2021-06-07 MED ORDER — DOXYCYCLINE HYCLATE 100 MG PO CAPS: 100.0000 mg | ORAL_CAPSULE | Freq: Two times a day (BID) | ORAL | 0 refills | Status: DC

## 2021-06-07 NOTE — ED Provider Notes (Signed)
[facility]    CSN: [id number] Arrival date & time: 06/07/21  1254      History   Chief Complaint No chief complaint on file.   HPI Natasha Wright is a 60 y.o. female.   HPI Patient is followed by [facility] and reports this week she was diagnosed with multiple superficial venous thrombosis involving the right lower extremity.  She reports she was advised to start a anticoagulant however declined and is currently taking ASA.  She reports yesterday developing right ankle and right foot swelling, redness and pain.  She has kept extremity elevated however redness has gradually progressed and she is now having pain.  She is concerned for cellulitis.  She has an appointment to see her PCP on tomorrow however wanted to start on antibiotics as soon as possible.  She is afebrile.  Has a history of cellulitis which resolved with doxycycline.  Past Medical History:  Diagnosis Date   Anxiety    Arthritis    B12 deficiency    h/o   Family history of heart disease    GERD (gastroesophageal reflux disease)    Hypoglycemia    IBS (irritable bowel syndrome)    Lumbar disc disease    OA (osteoarthritis)    PONV (postoperative nausea and vomiting)    needs scop patch   Rectocele    Shingles    Spinal stenosis in cervical region     Patient Active Problem List   Diagnosis Date Noted   Dysphagia    Diarrhea    Dyspepsia 06/08/2018   Bilateral pes planus 05/17/2018   Abdominal pain, left lateral 02/10/2018   GERD (gastroesophageal reflux disease) 11/29/2016   Nocturnal hypoxemia 03/01/2014   Chest pain 03/01/2014   DOE (dyspnea on exertion) 03/01/2014   Abdominal bloating 04/18/2013   UNSPECIFIED PERIPHERAL VASCULAR DISEASE 06/11/2009   CARPAL TUNNEL SYNDROME, BILATERAL 12/20/2008   NECK PAIN, CHRONIC 12/06/2008   FATIGUE 12/06/2008   VIRAL URI 08/09/2008   HYPOGLYCEMIA 07/15/2008   MACULAR DEGENERATION 07/15/2008   Varicose veins of right lower extremity  with complications 35/45/6256   ALLERGIC RHINITIS 07/15/2008   Constipation 07/15/2008   IBS 07/15/2008   OVERACTIVE BLADDER 07/15/2008   ARTHRITIS 07/15/2008   LOW BACK PAIN 07/15/2008   FIBROMYALGIA 07/15/2008   INSOMNIA 07/15/2008   MIGRAINES, HX OF 07/15/2008    Past Surgical History:  Procedure Laterality Date   ABDOMINAL HYSTERECTOMY  1992   ARTHRODESIS FOOT WITH WEIL OSTEOTOMY Left 05/09/2014   Procedure: LEFT FIRST TARSAL METATARSAL  ARTHRODESIS; LEFT SECOND WEIL OSTEOTOMY;  Surgeon: Wylene Simmer, MD;  Location: [place];  Service: Orthopedics;  Laterality: Left;   BIOPSY  07/11/2018   Procedure: BIOPSY;  Surgeon: Danie Binder, MD;  [facility];  Service: Endoscopy;;  duodenal biopsy , gastric biopsy    BUNIONECTOMY WITH HAMMERTOE RECONSTRUCTION Left 05/09/2014   Procedure: LEFT MODIFIED MCBRIDE BUNIONECTOMY; LEFT SECOND HAMMER TOE CORRECTION;  Surgeon: Wylene Simmer, MD;  Location: [place];  Service: Orthopedics;  Laterality: Left;   CERVICAL SPINE SURGERY  2009   CHOLECYSTECTOMY  2004   lap choli   COLONOSCOPY  07/08/2003   RMR: Left-sided diverticula, remainder of colonic mucosa and terminal ileum appeared normal.. Minimally friable internal hemorrhoids, otherwise normal rectum   COLONOSCOPY N/A 04/30/2013   Dr. Oneida Alar: mild diverticulosis in descending and sigmoid colon, internal hemorrhoids.    DILATION AND CURETTAGE OF UTERUS     ESOPHAGOGASTRODUODENOSCOPY (EGD)  WITH PROPOFOL N/A 07/11/2018   Procedure: ESOPHAGOGASTRODUODENOSCOPY (EGD) WITH PROPOFOL;  Surgeon: Danie Binder, MD;  [facility];  Service: Endoscopy;  Laterality: N/A;  9:30am   NM MYOCAR PERF WALL MOTION  10/2011   bruce myoview - breast attenuation noted in anterior region; EF 65%; no ischemia/infarct/scar; low risk   SAVORY DILATION N/A 07/11/2018   Procedure: SAVORY DILATION;  Surgeon: Danie Binder, MD;  [facility];  Service:  Endoscopy;  Laterality: N/A;   TONSILLECTOMY  1980   TUBAL LIGATION      OB History   No obstetric history on file.      Home Medications    Prior to Admission medications   Medication Sig Start Date End Date Taking? Authorizing Provider  doxycycline (VIBRAMYCIN) 100 MG capsule Take 1 capsule (100 mg total) by mouth 2 (two) times daily. 06/07/21  Yes Scot Jun, FNP  acetaminophen (TYLENOL) 650 MG CR tablet Take 1,300 mg by mouth 2 (two) times daily as needed for pain.    [provider]  citalopram (CELEXA) 20 MG tablet Take 1 tablet (20 mg total) by mouth 2 (two) times daily. Patient taking differently: Take 10-20 mg by mouth 2 (two) times daily. Takes 10mg  in am and 20mg  in pm 08/04/18   Herminio Commons, MD  diazepam (VALIUM) 10 MG tablet Take 10 mg by mouth every 8 (eight) hours as needed (muscle spasms).  Patient not taking: Reported on 04/09/2021    [provider]  dicyclomine (BENTYL) 20 MG tablet Take 20 mg by mouth 4 (four) times daily as needed for spasms. Patient not taking: Reported on 04/09/2021    [provider]  diltiazem (CARDIZEM) 60 MG tablet Take 1 tablet (60 mg total) by mouth 2 (two) times daily. 06/18/20   Strader, Fransisco Hertz, PA-C  estradiol (ESTRACE) 0.5 MG tablet Take 0.5 mg by mouth at bedtime.     [provider]  fluticasone (FLONASE) 50 MCG/ACT nasal spray Place 1 spray into both nostrils 2 (two) times daily.    [provider]  furosemide (LASIX) 20 MG tablet Take 20 mg by mouth every other day. 12/30/20   [provider]  ipratropium (ATROVENT) 0.06 % nasal spray Place 2 sprays into both nostrils daily.    [provider]  Multiple Vitamin (MULTIVITAMIN WITH MINERALS) TABS tablet Take 1 tablet by mouth daily.    [provider]  pantoprazole (PROTONIX) 40 MG tablet TAKE 1 TABLET BY MOUTH 30 MINUTES PRIOR TO MEALS TWICE DAILY 03/05/19   Mahala Menghini, PA-C  propranolol  (INDERAL) 10 MG tablet Take 10 mg daily as needed for palpitations 01/06/21   Josue Hector, MD    Family History Family History  Problem Relation Age of Onset   Diabetes Mother    Hypertension Mother    Hyperlipidemia Mother    Ulcerative colitis Father    Breast cancer Maternal Grandmother    Hypertension Maternal Grandmother    Diabetes Maternal Grandmother    Stroke Maternal Grandmother    Asthma Maternal Grandmother    Stroke Maternal Grandfather    Stroke Paternal Grandmother    Diabetes Paternal Grandmother    Heart disease Paternal Grandmother    Heart disease Paternal Grandfather    Hyperlipidemia Brother    Hyperlipidemia Sister    Hypertension Sister    Diabetes Sister    Down syndrome Child    Heart disease Child    Colon cancer  Neg Hx     Social History Social History   Tobacco Use   Smoking status: Former    Packs/day: 0.50    Years: 40.00    Pack years: 20.00    Types: Cigarettes    Quit date: 12/24/2013    Years since quitting: 7.4   Smokeless tobacco: Never   Tobacco comments:    using vape as aid to sustain from smoking  Vaping Use   Vaping Use: Never used  Substance Use Topics   Alcohol use: No   Drug use: No     Allergies   Penicillins, Sulfamethoxazole-trimethoprim, Codeine, Fentanyl, Morphine, Adhesive [tape], Influenza vaccines, Latex, and Nsaids   Review of Systems Review of Systems Pertinent negatives listed in HPI   Physical Exam Triage Vital Signs ED Triage Vitals [06/07/21 1342]  Enc Vitals Group     BP 125/77     Pulse Rate (!) 101     Resp 18     Temp 98.9 F (37.2 C)     Temp Source Oral     SpO2 93 %     Weight      Height      Head Circumference      Peak Flow      Pain Score      Pain Loc      Pain Edu?      Excl. in [place]?    No data found.  Updated Vital Signs BP 125/77 (BP Location: Right Arm)   Pulse (!) 101   Temp 98.9 F (37.2 C) (Oral)   Resp 18   SpO2 93%   Visual Acuity Right Eye  Distance:   Left Eye Distance:   Bilateral Distance:    Right Eye Near:   Left Eye Near:    Bilateral Near:     Physical Exam Constitutional:      Appearance: Normal appearance.  Cardiovascular:     Rate and Rhythm: Tachycardia present.  Pulmonary:     Effort: Pulmonary effort is normal.     Breath sounds: Normal breath sounds.  Musculoskeletal:       Legs:  Skin:    Capillary Refill: Capillary refill takes less than 2 seconds.  Neurological:     General: No focal deficit present.     Mental Status: She is alert.  Psychiatric:        Mood and Affect: Mood normal.        Behavior: Behavior normal.        Thought Content: Thought content normal.        Judgment: Judgment normal.     UC Treatments / Results  Labs (all labs ordered are listed, but only abnormal results are displayed) Labs Reviewed - No data to display  EKG   Radiology No results found.  Procedures Procedures (including critical care time)  Medications Ordered in UC Medications - No data to display  Initial Impression / Assessment and Plan / UC Course  I have reviewed the triage vital signs and the nursing notes.  Pertinent labs & imaging results that were available during my care of the patient were reviewed by me and considered in my medical decision making (see chart for details).    Cellulitis involving the right lower extremity.  Treatment today with doxycycline 100 mg twice daily for total of 10 days.  Patient will follow-up with PCP as scheduled tomorrow morning for further work-up and evaluation as explained to patient I cannot rule out the possibility  of DVT regardless despite ultrasound findings earlier this week.  Patient verbalized understanding and will follow up with PCP in the morning and will start on antibiotics today.  ER precautions if symptoms worsen or do not improve. Final Clinical Impressions(s) / UC Diagnoses   Final diagnoses:  Cellulitis of leg, right   Discharge  Instructions   None    ED Prescriptions     Medication Sig Dispense Auth. Provider   doxycycline (VIBRAMYCIN) 100 MG capsule Take 1 capsule (100 mg total) by mouth 2 (two) times daily. 20 capsule Scot Jun, FNP      PDMP not reviewed this encounter.   Scot Jun, FNP 06/07/21 1440

## 2021-06-07 NOTE — ED Triage Notes (Addendum)
Pt recently dx with blood clots in her lower LT leg.  Pt foot is now red and swollen since Friday.  Pt worried its cellulitis and wants to get on abx before it gets worse.  Has appt with pcp tomorrow but wants to get ahead of this.

## 2021-06-08 DIAGNOSIS — L03115 Cellulitis of right lower limb: Secondary | ICD-10-CM | POA: Diagnosis not present

## 2021-06-08 DIAGNOSIS — I872 Venous insufficiency (chronic) (peripheral): Secondary | ICD-10-CM | POA: Diagnosis not present

## 2021-06-10 DIAGNOSIS — K219 Gastro-esophageal reflux disease without esophagitis: Secondary | ICD-10-CM | POA: Diagnosis not present

## 2021-06-10 DIAGNOSIS — L03115 Cellulitis of right lower limb: Secondary | ICD-10-CM | POA: Diagnosis not present

## 2021-06-21 DIAGNOSIS — G894 Chronic pain syndrome: Secondary | ICD-10-CM | POA: Diagnosis not present

## 2021-06-21 DIAGNOSIS — M1991 Primary osteoarthritis, unspecified site: Secondary | ICD-10-CM | POA: Diagnosis not present

## 2021-06-25 DIAGNOSIS — M461 Sacroiliitis, not elsewhere classified: Secondary | ICD-10-CM | POA: Diagnosis not present

## 2021-06-25 DIAGNOSIS — M47818 Spondylosis without myelopathy or radiculopathy, sacral and sacrococcygeal region: Secondary | ICD-10-CM | POA: Diagnosis not present

## 2021-07-07 DIAGNOSIS — I890 Lymphedema, not elsewhere classified: Secondary | ICD-10-CM | POA: Diagnosis not present

## 2021-07-07 DIAGNOSIS — I87391 Chronic venous hypertension (idiopathic) with other complications of right lower extremity: Secondary | ICD-10-CM | POA: Diagnosis not present

## 2021-07-22 DIAGNOSIS — Z1389 Encounter for screening for other disorder: Secondary | ICD-10-CM | POA: Diagnosis not present

## 2021-07-22 DIAGNOSIS — E039 Hypothyroidism, unspecified: Secondary | ICD-10-CM | POA: Diagnosis not present

## 2021-07-22 DIAGNOSIS — E538 Deficiency of other specified B group vitamins: Secondary | ICD-10-CM | POA: Diagnosis not present

## 2021-07-22 DIAGNOSIS — K219 Gastro-esophageal reflux disease without esophagitis: Secondary | ICD-10-CM | POA: Diagnosis not present

## 2021-07-22 DIAGNOSIS — M1991 Primary osteoarthritis, unspecified site: Secondary | ICD-10-CM | POA: Diagnosis not present

## 2021-07-22 DIAGNOSIS — G894 Chronic pain syndrome: Secondary | ICD-10-CM | POA: Diagnosis not present

## 2021-07-22 DIAGNOSIS — Z0001 Encounter for general adult medical examination with abnormal findings: Secondary | ICD-10-CM | POA: Diagnosis not present

## 2021-07-22 DIAGNOSIS — E559 Vitamin D deficiency, unspecified: Secondary | ICD-10-CM | POA: Diagnosis not present

## 2021-08-10 DIAGNOSIS — I890 Lymphedema, not elsewhere classified: Secondary | ICD-10-CM | POA: Diagnosis not present

## 2021-08-10 DIAGNOSIS — I83893 Varicose veins of bilateral lower extremities with other complications: Secondary | ICD-10-CM | POA: Diagnosis not present

## 2021-08-12 DIAGNOSIS — I890 Lymphedema, not elsewhere classified: Secondary | ICD-10-CM | POA: Diagnosis not present

## 2021-09-08 DIAGNOSIS — J310 Chronic rhinitis: Secondary | ICD-10-CM | POA: Diagnosis not present

## 2021-09-08 DIAGNOSIS — J343 Hypertrophy of nasal turbinates: Secondary | ICD-10-CM | POA: Diagnosis not present

## 2021-09-08 DIAGNOSIS — H903 Sensorineural hearing loss, bilateral: Secondary | ICD-10-CM | POA: Diagnosis not present

## 2021-11-09 DIAGNOSIS — I890 Lymphedema, not elsewhere classified: Secondary | ICD-10-CM | POA: Diagnosis not present

## 2021-11-09 DIAGNOSIS — I87393 Chronic venous hypertension (idiopathic) with other complications of bilateral lower extremity: Secondary | ICD-10-CM | POA: Diagnosis not present

## 2021-11-24 DIAGNOSIS — K5792 Diverticulitis of intestine, part unspecified, without perforation or abscess without bleeding: Secondary | ICD-10-CM | POA: Diagnosis not present

## 2021-11-24 DIAGNOSIS — M1991 Primary osteoarthritis, unspecified site: Secondary | ICD-10-CM | POA: Diagnosis not present

## 2021-11-26 ENCOUNTER — Other Ambulatory Visit (HOSPITAL_COMMUNITY): Payer: Self-pay | Admitting: Internal Medicine

## 2021-11-26 ENCOUNTER — Other Ambulatory Visit: Payer: Self-pay | Admitting: Internal Medicine

## 2021-11-26 DIAGNOSIS — K5792 Diverticulitis of intestine, part unspecified, without perforation or abscess without bleeding: Secondary | ICD-10-CM

## 2021-11-28 ENCOUNTER — Other Ambulatory Visit: Payer: Self-pay | Admitting: Cardiovascular Disease

## 2021-12-02 DIAGNOSIS — M7989 Other specified soft tissue disorders: Secondary | ICD-10-CM | POA: Diagnosis not present

## 2021-12-02 DIAGNOSIS — I872 Venous insufficiency (chronic) (peripheral): Secondary | ICD-10-CM | POA: Diagnosis not present

## 2021-12-02 DIAGNOSIS — I83891 Varicose veins of right lower extremities with other complications: Secondary | ICD-10-CM | POA: Diagnosis not present

## 2021-12-02 DIAGNOSIS — I890 Lymphedema, not elsewhere classified: Secondary | ICD-10-CM | POA: Diagnosis not present

## 2021-12-07 ENCOUNTER — Ambulatory Visit (HOSPITAL_BASED_OUTPATIENT_CLINIC_OR_DEPARTMENT_OTHER)
Admission: RE | Admit: 2021-12-07 | Discharge: 2021-12-07 | Disposition: A | Discharge status: Home / Self Care | Payer: Medicare Other | Source: Ambulatory Visit | Attending: Internal Medicine | Admitting: Internal Medicine

## 2021-12-07 ENCOUNTER — Other Ambulatory Visit: Payer: Self-pay

## 2021-12-07 ENCOUNTER — Encounter (HOSPITAL_BASED_OUTPATIENT_CLINIC_OR_DEPARTMENT_OTHER): Payer: Self-pay

## 2021-12-07 DIAGNOSIS — K5792 Diverticulitis of intestine, part unspecified, without perforation or abscess without bleeding: Secondary | ICD-10-CM | POA: Insufficient documentation

## 2021-12-07 DIAGNOSIS — R109 Unspecified abdominal pain: Secondary | ICD-10-CM | POA: Diagnosis not present

## 2021-12-07 MED ORDER — IOHEXOL 300 MG/ML  SOLN
85.0000 mL | Freq: Once | INTRAMUSCULAR | Status: AC | PRN
  Administered 2021-12-07: 85 mL via INTRAVENOUS

## 2021-12-14 DIAGNOSIS — M25512 Pain in left shoulder: Secondary | ICD-10-CM | POA: Diagnosis not present

## 2021-12-14 DIAGNOSIS — M25522 Pain in left elbow: Secondary | ICD-10-CM | POA: Diagnosis not present

## 2022-01-13 ENCOUNTER — Other Ambulatory Visit: Payer: Self-pay | Admitting: Orthopaedic Surgery

## 2022-01-13 DIAGNOSIS — M19071 Primary osteoarthritis, right ankle and foot: Secondary | ICD-10-CM | POA: Diagnosis not present

## 2022-01-13 DIAGNOSIS — M75100 Unspecified rotator cuff tear or rupture of unspecified shoulder, not specified as traumatic: Secondary | ICD-10-CM

## 2022-01-13 DIAGNOSIS — M25512 Pain in left shoulder: Secondary | ICD-10-CM | POA: Diagnosis not present

## 2022-01-30 ENCOUNTER — Other Ambulatory Visit: Payer: Self-pay

## 2022-01-30 ENCOUNTER — Ambulatory Visit
Admission: RE | Admit: 2022-01-30 | Discharge: 2022-01-30 | Disposition: A | Discharge status: Home / Self Care | Payer: Medicare Other | Source: Ambulatory Visit | Attending: Orthopaedic Surgery | Admitting: Orthopaedic Surgery

## 2022-01-30 DIAGNOSIS — R600 Localized edema: Secondary | ICD-10-CM | POA: Diagnosis not present

## 2022-01-30 DIAGNOSIS — M25512 Pain in left shoulder: Secondary | ICD-10-CM | POA: Diagnosis not present

## 2022-01-30 DIAGNOSIS — M75100 Unspecified rotator cuff tear or rupture of unspecified shoulder, not specified as traumatic: Secondary | ICD-10-CM

## 2022-02-03 DIAGNOSIS — M25512 Pain in left shoulder: Secondary | ICD-10-CM | POA: Diagnosis not present

## 2022-02-06 ENCOUNTER — Other Ambulatory Visit: Payer: Self-pay | Admitting: Cardiovascular Disease

## 2022-02-11 ENCOUNTER — Other Ambulatory Visit: Payer: Self-pay | Admitting: Internal Medicine

## 2022-02-11 DIAGNOSIS — Z1231 Encounter for screening mammogram for malignant neoplasm of breast: Secondary | ICD-10-CM

## 2022-02-12 ENCOUNTER — Telehealth: Payer: Self-pay | Admitting: Cardiovascular Disease

## 2022-02-12 NOTE — Telephone Encounter (Signed)
Pt has appt with Dr. [name] 03/08/22 for pre op clearance. Will send FYI to requesting office pt has appt.  ?

## 2022-02-12 NOTE — Telephone Encounter (Signed)
Primary Cardiologist:Suresh Bronson Ing, MD (Inactive) ? ?Chart reviewed as part of pre-operative protocol coverage. Because of Natasha Wright's past medical history and time since last visit, he/she will require a follow-up visit in order to better assess preoperative cardiovascular risk. ? ?Pre-op covering staff: ?- Please schedule appointment and call patient to inform them. ?- Please contact requesting surgeon's office via preferred method (i.e, phone, fax) to inform them of need for appointment prior to surgery. ? ?If applicable, this message will also be routed to pharmacy pool and/or primary cardiologist for input on holding anticoagulant/antiplatelet agent as requested below so that this information is available at time of patient's appointment.  ? ?Deberah Pelton, NP  ?02/12/2022, 3:33 PM  ? ?

## 2022-02-12 NOTE — Telephone Encounter (Signed)
? ?  Pre-operative Risk Assessment  ?  ?Patient Name: Natasha Wright  ?DOB: 1961/07/23 ?MRN: [id number]  ? ?  ? ?Request for Surgical Clearance   ? ?Procedure:   Left Shoulder Scope ? ?Date of Surgery:  Clearance 03/11/22                              ?   ?Surgeon:  Dr. Melrose Nakayama ?Surgeon's Group or Practice Name:  [facility] ?Phone number:  [phone] ?Fax number:  [phone] ?  ?Type of Clearance Requested:   ?- Medical  ?  ?Type of Anesthesia:  General  ?  ?Additional requests/questions:  Please fax a copy of Medical Clearance to the surgeon's office. ? ?Signed, ?Durel Salts   ?02/12/2022, 3:18 PM   ?

## 2022-02-16 DIAGNOSIS — Z1231 Encounter for screening mammogram for malignant neoplasm of breast: Secondary | ICD-10-CM

## 2022-02-27 NOTE — Progress Notes (Signed)
? ? ? ?Primary Cardiologist : Bronson Ing new to me  ? ?SUBJECTIVE: Natasha Wright is a 61 y.o. female with a history of chest pain, tachycardia, and palpitations.   ? ?She has a history of multiple cervical spine surgeries and has markedly diminished range of motion. She is on disability. She also has lower back pain, hip pain, diffuse osteoarthritic pain, and bilateral feet pain and has undergone surgeries in both of her feet.Had some cellulitis in right  foot /ankle July after ganglion cyst removal Rx with doxycyline  ? ?She has lots of stress On her 3rd marriage Adult son Natasha Wright with Natasha Wright Syndrome has cardiac complications  ? ?Has had benign SSCP and palpitations in past with normal echo in 2018 and normal myovue 06/05/20 EF 53%  ? ?Still with PTSD issues and sons health He aspirates and had a trach with multiple long hospitalizations ? ?Seen today for preoperative clearance Needs left shoulder scope with Dr Novella Olive  ? ?She has no cardiac complaints and no chest pain. She has had multiple neck surgeries and left foot reconstruction She has lymphedema and uses pump last 6 months  ? ?Had some asymptomatic PVCls in office  ? ? ?Soc Hx: Her husband is retired from the [employer]. This is her third marriage. They have been married x 17 yrs.  She has an adult son with Down's syndrome Natasha Wright) who has several cardiac issues. ? ?Review of Systems: As per "subjective", otherwise negative. ? ?Allergies  ?Allergen Reactions  ? Penicillins Shortness Of Breath and Rash  ?  Has patient had a PCN reaction causing immediate rash, facial/tongue/throat swelling, SOB or lightheadedness with hypotension: Yes ?Has patient had a PCN reaction causing severe rash involving mucus membranes or skin necrosis: No ?Has patient had a PCN reaction that required hospitalization Yes ?Has patient had a PCN reaction occurring within the last 10 years: No ?If all of the above answers are "NO", then may proceed with Cephalosporin  use. ?  ? Sulfamethoxazole-Trimethoprim Shortness Of Breath and Rash  ? Codeine Nausea Only and Other (See Comments)  ?  skin crawling  ? Fentanyl Nausea Only and Other (See Comments)  ?  skin crawling, passed out  ? Morphine Nausea Only and Other (See Comments)  ?  skin crawling  ? Adhesive [Tape] Other (See Comments)  ?  Rips off skin, paper tape is ok  ? Influenza Vaccines Rash  ? Latex Rash  ? Nsaids Nausea Only  ? ? ?Current Outpatient Medications  ?Medication Sig Dispense Refill  ? acetaminophen (TYLENOL) 650 MG CR tablet Take 1,300 mg by mouth 2 (two) times daily as needed for pain.    ? citalopram (CELEXA) 20 MG tablet Take 1 tablet (20 mg total) by mouth 2 (two) times daily. (Patient taking differently: Take 10-20 mg by mouth 2 (two) times daily. Takes '10mg'$  in am and '20mg'$  in pm) 180 tablet 3  ? diazepam (VALIUM) 10 MG tablet Take 10 mg by mouth every 8 (eight) hours as needed (muscle spasms).  (Patient not taking: Reported on 04/09/2021)    ? dicyclomine (BENTYL) 20 MG tablet Take 20 mg by mouth 4 (four) times daily as needed for spasms. (Patient not taking: Reported on 04/09/2021)    ? diltiazem (CARDIZEM) 60 MG tablet Take 1 tablet (60 mg total) by mouth 2 (two) times daily. 180 tablet 3  ? doxycycline (VIBRAMYCIN) 100 MG capsule Take 1 capsule (100 mg total) by mouth 2 (two) times daily. 20 capsule  0  ? estradiol (ESTRACE) 0.5 MG tablet Take 0.5 mg by mouth at bedtime.     ? fluticasone (FLONASE) 50 MCG/ACT nasal spray Place 1 spray into both nostrils 2 (two) times daily.    ? furosemide (LASIX) 20 MG tablet Take 20 mg by mouth every other day.    ? ipratropium (ATROVENT) 0.06 % nasal spray Place 2 sprays into both nostrils daily.    ? Multiple Vitamin (MULTIVITAMIN WITH MINERALS) TABS tablet Take 1 tablet by mouth daily.    ? pantoprazole (PROTONIX) 40 MG tablet TAKE 1 TABLET BY MOUTH 30 MINUTES PRIOR TO MEALS TWICE DAILY 60 tablet 11  ? propranolol (INDERAL) 10 MG tablet TAKE 1 TABLET BY MOUTH DAILY AS   NEEDED FOR PALPITATIONS 30 tablet 11  ? ?No current facility-administered medications for this visit.  ? ? ?Past Medical History:  ?Diagnosis Date  ? Anxiety   ? Arthritis   ? B12 deficiency   ? h/o  ? Family history of heart disease   ? GERD (gastroesophageal reflux disease)   ? Hypoglycemia   ? IBS (irritable bowel syndrome)   ? Lumbar disc disease   ? OA (osteoarthritis)   ? PONV (postoperative nausea and vomiting)   ? needs scop patch  ? Rectocele   ? Shingles   ? Spinal stenosis in cervical region   ? ? ?Past Surgical History:  ?Procedure Laterality Date  ? ABDOMINAL HYSTERECTOMY  1992  ? ARTHRODESIS FOOT WITH WEIL OSTEOTOMY Left 05/09/2014  ? Procedure: LEFT FIRST TARSAL METATARSAL  ARTHRODESIS; LEFT SECOND WEIL OSTEOTOMY;  Surgeon: Wylene Simmer, MD;  Location: [place];  Service: Orthopedics;  Laterality: Left;  ? BIOPSY  07/11/2018  ? Procedure: BIOPSY;  Surgeon: Danie Binder, MD;  [facility];  Service: Endoscopy;;  duodenal biopsy , gastric biopsy   ? BUNIONECTOMY WITH HAMMERTOE RECONSTRUCTION Left 05/09/2014  ? Procedure: LEFT MODIFIED MCBRIDE BUNIONECTOMY; LEFT SECOND HAMMER TOE CORRECTION;  Surgeon: Wylene Simmer, MD;  Location: [place];  Service: Orthopedics;  Laterality: Left;  ? CERVICAL SPINE SURGERY  2009  ? CHOLECYSTECTOMY  2004  ? lap choli  ? COLONOSCOPY  07/08/2003  ? RMR: Left-sided diverticula, remainder of colonic mucosa and terminal ileum appeared normal.. Minimally friable internal hemorrhoids, otherwise normal rectum  ? COLONOSCOPY N/A 04/30/2013  ? Dr. Oneida Alar: mild diverticulosis in descending and sigmoid colon, internal hemorrhoids.   ? DILATION AND CURETTAGE OF UTERUS    ? ESOPHAGOGASTRODUODENOSCOPY (EGD) WITH PROPOFOL N/A 07/11/2018  ? Procedure: ESOPHAGOGASTRODUODENOSCOPY (EGD) WITH PROPOFOL;  Surgeon: Danie Binder, MD;  [facility];  Service: Endoscopy;  Laterality: N/A;  9:30am  ? NM MYOCAR PERF WALL MOTION  10/2011  ? bruce  myoview - breast attenuation noted in anterior region; EF 65%; no ischemia/infarct/scar; low risk  ? SAVORY DILATION N/A 07/11/2018  ? Procedure: SAVORY DILATION;  Surgeon: Danie Binder, MD;  [facility];  Service: Endoscopy;  Laterality: N/A;  ? TONSILLECTOMY  1980  ? TUBAL LIGATION    ? ? ?Social History  ? ?Socioeconomic History  ? Marital status: Married  ?  Spouse name: Mikeal Hawthorne  ? Number of children: 1  ? Years of education: 69  ? Highest education level: Not on file  ?Occupational History  ? Occupation: Unemployed  ?  Employer: [employer]  ?  Employer: UNEMPLOYED  ?Tobacco Use  ? Smoking status: Former  ?  Packs/day: 0.50  ?  Years: 40.00  ?  Pack years: 20.00  ?  Types: Cigarettes  ?  Quit date: 12/24/2013  ?  Years since quitting: 8.1  ? Smokeless tobacco: Never  ? Tobacco comments:  ?  using vape as aid to sustain from smoking  ?Vaping Use  ? Vaping Use: Never used  ?Substance and Sexual Activity  ? Alcohol use: No  ? Drug use: No  ? Sexual activity: Not on file  ?Other Topics Concern  ? Not on file  ?Social History Narrative  ? One son, Down syndrome,is right handed ,resides with son and husband  ? Right handed  ? ?Social Determinants of Health  ? ?Financial Resource Strain: Not on file  ?Food Insecurity: Not on file  ?Transportation Needs: Not on file  ?Physical Activity: Not on file  ?Stress: Not on file  ?Social Connections: Not on file  ?Intimate Partner Violence: Not on file  ? ? ?Barbarann Ehlers, RN was present throughout the entirety of the encounter. ? ?There were no vitals filed for this visit. ? ? ?Wt Readings from Last 3 Encounters:  ?04/09/21 187 lb (84.8 kg)  ?01/06/21 186 lb (84.4 kg)  ?06/05/20 174 lb (78.9 kg)  ? ? ? ?PHYSICAL EXAM ?There were no vitals taken for this visit. ?Affect appropriate ?Healthy:  appears stated age ?HEENT: normal ?Neck supple with no adenopathy ?JVP normal no bruits no thyromegaly ?Lungs clear with no wheezing and good diaphragmatic  motion ?Heart:  S1/S2 no murmur, no rub, gallop or click ?PMI normal ?Abdomen: benighn, BS positve, no tenderness, no AAA ?no bruit.  No HSM or HJR ?Distal pulses intact with no bruits ?Plus one bilateral edem

## 2022-03-08 ENCOUNTER — Ambulatory Visit: Payer: Medicare Other | Admitting: Cardiovascular Disease

## 2022-03-08 ENCOUNTER — Encounter: Payer: Self-pay | Admitting: Cardiovascular Disease

## 2022-03-08 VITALS — BP 128/82 | HR 84 | Ht 67.5 in | Wt 191.0 lb

## 2022-03-08 DIAGNOSIS — I493 Ventricular premature depolarization: Secondary | ICD-10-CM

## 2022-03-08 DIAGNOSIS — R072 Precordial pain: Secondary | ICD-10-CM | POA: Diagnosis not present

## 2022-03-08 DIAGNOSIS — Z01810 Encounter for preprocedural cardiovascular examination: Secondary | ICD-10-CM | POA: Diagnosis not present

## 2022-03-08 DIAGNOSIS — R002 Palpitations: Secondary | ICD-10-CM

## 2022-03-08 DIAGNOSIS — I890 Lymphedema, not elsewhere classified: Secondary | ICD-10-CM | POA: Diagnosis not present

## 2022-03-08 MED ORDER — METOPROLOL TARTRATE 25 MG PO TABS: 25.0000 mg | ORAL_TABLET | Freq: Two times a day (BID) | ORAL | 3 refills | Status: DC

## 2022-03-08 NOTE — Patient Instructions (Addendum)
Medication Instructions:  ? ?Stop Taking Cardizem and Propranolol  ? ?Start Taking Lopressor 25 mg Two Times Daily  ? ?*If you need a refill on your cardiac medications before your next appointment, please call your pharmacy* ? ? ?Lab Work: ?NONE  ? ?If you have labs (blood work) drawn today and your tests are completely normal, you will receive your results only by: ?MyChart Message (if you have MyChart) OR ?A paper copy in the mail ?If you have any lab test that is abnormal or we need to change your treatment, we will call you to review the results. ? ? ?Testing/Procedures: ?Your physician has requested that you have an echocardiogram. Echocardiography is a painless test that uses sound waves to create images of your heart. It provides your doctor with information about the size and shape of your heart and how well your heart?s chambers and valves are working. This procedure takes approximately one hour. There are no restrictions for this procedure. ? ? ? ?Follow-Up: ?At [facility], you and your health needs are our priority.  As part of our continuing mission to provide you with exceptional heart care, we have created designated Provider Care Teams.  These Care Teams include your primary Cardiologist (physician) and Advanced Practice Providers (APPs -  Physician Assistants and Nurse Practitioners) who all work together to provide you with the care you need, when you need it. ? ?We recommend signing up for the patient portal called "MyChart".  Sign up information is provided on this After Visit Summary.  MyChart is used to connect with patients for Virtual Visits (Telemedicine).  Patients are able to view lab/test results, encounter notes, upcoming appointments, etc.  Non-urgent messages can be sent to your provider as well.   ?To learn more about what you can do with MyChart, go to [email or web address].   ? ?Your next appointment:   ?1 year(s) ? ?The format for your next appointment:   ?In  Person ? ?Provider:   ?Jenkins Rouge, MD  ? ? ?Other Instructions ?Thank you for choosing [facility]! ? ? ? ?Important Information About [facility] ? ? ? ? ? ? ?

## 2022-03-11 ENCOUNTER — Other Ambulatory Visit: Payer: Self-pay

## 2022-03-11 DIAGNOSIS — M7551 Bursitis of right shoulder: Secondary | ICD-10-CM | POA: Diagnosis not present

## 2022-03-11 DIAGNOSIS — G8918 Other acute postprocedural pain: Secondary | ICD-10-CM | POA: Diagnosis not present

## 2022-03-11 DIAGNOSIS — M19012 Primary osteoarthritis, left shoulder: Secondary | ICD-10-CM | POA: Diagnosis not present

## 2022-03-11 DIAGNOSIS — M75112 Incomplete rotator cuff tear or rupture of left shoulder, not specified as traumatic: Secondary | ICD-10-CM | POA: Diagnosis not present

## 2022-03-11 DIAGNOSIS — M948X1 Other specified disorders of cartilage, shoulder: Secondary | ICD-10-CM | POA: Diagnosis not present

## 2022-03-11 MED ORDER — METOPROLOL TARTRATE 25 MG PO TABS: 25.0000 mg | ORAL_TABLET | Freq: Two times a day (BID) | ORAL | 3 refills | Status: DC

## 2022-03-11 NOTE — Telephone Encounter (Signed)
[facility] mail order pharmacy is requesting that pt's medication be resent to them. Please address ?

## 2022-03-12 ENCOUNTER — Other Ambulatory Visit: Payer: Self-pay | Admitting: Internal Medicine

## 2022-03-12 DIAGNOSIS — Z1231 Encounter for screening mammogram for malignant neoplasm of breast: Secondary | ICD-10-CM

## 2022-03-18 DIAGNOSIS — L570 Actinic keratosis: Secondary | ICD-10-CM | POA: Diagnosis not present

## 2022-03-18 DIAGNOSIS — X32XXXD Exposure to sunlight, subsequent encounter: Secondary | ICD-10-CM | POA: Diagnosis not present

## 2022-03-22 DIAGNOSIS — Z1231 Encounter for screening mammogram for malignant neoplasm of breast: Secondary | ICD-10-CM

## 2022-03-29 ENCOUNTER — Ambulatory Visit (HOSPITAL_COMMUNITY)
Admission: RE | Admit: 2022-03-29 | Discharge: 2022-03-29 | Disposition: A | Discharge status: Home / Self Care | Payer: Medicare Other | Source: Ambulatory Visit | Attending: Cardiovascular Disease | Admitting: Cardiovascular Disease

## 2022-03-29 DIAGNOSIS — R002 Palpitations: Secondary | ICD-10-CM | POA: Diagnosis not present

## 2022-03-29 DIAGNOSIS — R079 Chest pain, unspecified: Secondary | ICD-10-CM | POA: Insufficient documentation

## 2022-03-29 DIAGNOSIS — R0600 Dyspnea, unspecified: Secondary | ICD-10-CM | POA: Diagnosis not present

## 2022-03-29 DIAGNOSIS — Z87891 Personal history of nicotine dependence: Secondary | ICD-10-CM | POA: Diagnosis not present

## 2022-03-29 DIAGNOSIS — I493 Ventricular premature depolarization: Secondary | ICD-10-CM | POA: Diagnosis not present

## 2022-03-29 LAB — ECHOCARDIOGRAM COMPLETE
AR max vel: 2.39 cm2
AV Area VTI: 2.53 cm2
AV Area mean vel: 2.33 cm2
AV Mean grad: 3 mmHg
AV Peak grad: 4.7 mmHg
Ao pk vel: 1.08 m/s
Area-P 1/2: 4.21 cm2
Calc EF: 60.2 %
MV VTI: 2.79 cm2
S' Lateral: 2.9 cm
Single Plane A2C EF: 65.3 %
Single Plane A4C EF: 55.5 %

## 2022-03-29 NOTE — Progress Notes (Signed)
*  PRELIMINARY RESULTS* ?Echocardiogram ?2D Echocardiogram has been performed. ? ?Natasha Wright ?03/29/2022, 10:24 AM ?

## 2022-03-30 ENCOUNTER — Telehealth: Payer: Self-pay | Admitting: Cardiovascular Disease

## 2022-03-30 DIAGNOSIS — H6010 Cellulitis of external ear, unspecified ear: Secondary | ICD-10-CM | POA: Diagnosis not present

## 2022-03-30 DIAGNOSIS — L309 Dermatitis, unspecified: Secondary | ICD-10-CM | POA: Diagnosis not present

## 2022-03-30 DIAGNOSIS — M1991 Primary osteoarthritis, unspecified site: Secondary | ICD-10-CM | POA: Diagnosis not present

## 2022-03-30 NOTE — Telephone Encounter (Signed)
Patient walked in stating that she needs to speak with Dr. [name] regarding the medication Start Taking Lopressor 25 mg Two Times Daily  She states that this medication is making her very hot and flushed in her face. States that she is fatigue and if she bends over she starts feeling dizzy. [phone]. ?

## 2022-03-31 DIAGNOSIS — M13812 Other specified arthritis, left shoulder: Secondary | ICD-10-CM | POA: Diagnosis not present

## 2022-03-31 DIAGNOSIS — M75112 Incomplete rotator cuff tear or rupture of left shoulder, not specified as traumatic: Secondary | ICD-10-CM | POA: Diagnosis not present

## 2022-04-02 ENCOUNTER — Ambulatory Visit
Admission: RE | Admit: 2022-04-02 | Discharge: 2022-04-02 | Disposition: A | Discharge status: Home / Self Care | Payer: Medicare Other | Source: Ambulatory Visit

## 2022-04-02 DIAGNOSIS — Z1231 Encounter for screening mammogram for malignant neoplasm of breast: Secondary | ICD-10-CM

## 2022-04-05 ENCOUNTER — Telehealth: Payer: Self-pay

## 2022-04-05 NOTE — Telephone Encounter (Signed)
-----   Message from Michaelyn Barter, RN sent at 04/05/2022  3:00 PM EDT ----- ? ?----- Message ----- ?From: Josue Hector, MD ?Sent: 04/04/2022   8:00 PM EDT ?To: Michaelyn Barter, RN ? ?Normal echo with good EF and no significant valvular heart disease ? ? ?

## 2022-04-05 NOTE — Telephone Encounter (Signed)
Echo results discussed with patient, copied pcp ?

## 2022-04-05 NOTE — Telephone Encounter (Signed)
Pt requesting appt with Dr. [name] to review medications. Appt made for July 5.  ?

## 2022-04-06 DIAGNOSIS — M75112 Incomplete rotator cuff tear or rupture of left shoulder, not specified as traumatic: Secondary | ICD-10-CM | POA: Diagnosis not present

## 2022-04-06 DIAGNOSIS — M13812 Other specified arthritis, left shoulder: Secondary | ICD-10-CM | POA: Diagnosis not present

## 2022-04-08 DIAGNOSIS — M75112 Incomplete rotator cuff tear or rupture of left shoulder, not specified as traumatic: Secondary | ICD-10-CM | POA: Diagnosis not present

## 2022-04-08 DIAGNOSIS — M13812 Other specified arthritis, left shoulder: Secondary | ICD-10-CM | POA: Diagnosis not present

## 2022-04-13 DIAGNOSIS — M13812 Other specified arthritis, left shoulder: Secondary | ICD-10-CM | POA: Diagnosis not present

## 2022-04-13 DIAGNOSIS — M75112 Incomplete rotator cuff tear or rupture of left shoulder, not specified as traumatic: Secondary | ICD-10-CM | POA: Diagnosis not present

## 2022-04-15 DIAGNOSIS — M13812 Other specified arthritis, left shoulder: Secondary | ICD-10-CM | POA: Diagnosis not present

## 2022-04-15 DIAGNOSIS — M75112 Incomplete rotator cuff tear or rupture of left shoulder, not specified as traumatic: Secondary | ICD-10-CM | POA: Diagnosis not present

## 2022-04-21 DIAGNOSIS — M13812 Other specified arthritis, left shoulder: Secondary | ICD-10-CM | POA: Diagnosis not present

## 2022-04-21 DIAGNOSIS — M75112 Incomplete rotator cuff tear or rupture of left shoulder, not specified as traumatic: Secondary | ICD-10-CM | POA: Diagnosis not present

## 2022-04-22 DIAGNOSIS — R002 Palpitations: Secondary | ICD-10-CM | POA: Diagnosis not present

## 2022-04-22 DIAGNOSIS — I493 Ventricular premature depolarization: Secondary | ICD-10-CM | POA: Diagnosis not present

## 2022-04-23 DIAGNOSIS — M75112 Incomplete rotator cuff tear or rupture of left shoulder, not specified as traumatic: Secondary | ICD-10-CM | POA: Diagnosis not present

## 2022-04-23 DIAGNOSIS — M13812 Other specified arthritis, left shoulder: Secondary | ICD-10-CM | POA: Diagnosis not present

## 2022-04-23 DIAGNOSIS — I493 Ventricular premature depolarization: Secondary | ICD-10-CM | POA: Diagnosis not present

## 2022-04-28 DIAGNOSIS — M75112 Incomplete rotator cuff tear or rupture of left shoulder, not specified as traumatic: Secondary | ICD-10-CM | POA: Diagnosis not present

## 2022-04-28 DIAGNOSIS — M13812 Other specified arthritis, left shoulder: Secondary | ICD-10-CM | POA: Diagnosis not present

## 2022-05-10 DIAGNOSIS — M25561 Pain in right knee: Secondary | ICD-10-CM | POA: Diagnosis not present

## 2022-05-10 DIAGNOSIS — M25562 Pain in left knee: Secondary | ICD-10-CM | POA: Diagnosis not present

## 2022-05-20 NOTE — Progress Notes (Signed)
Primary Cardiologist :[name]  SUBJECTIVE: Natasha Wright is a 61 y.o. female with a history of chest pain, tachycardia, and palpitations.    She has a history of multiple cervical spine surgeries and has markedly diminished range of motion. She is on disability. She also has lower back pain, hip pain, diffuse osteoarthritic pain, and bilateral feet pain and has undergone surgeries in both of her feet.Had some cellulitis in right  foot /ankle July after ganglion cyst removal Rx with doxycyline   She has lots of stress On her 3rd marriage Adult son Natasha Wright with Natasha Wright Syndrome has cardiac complications   Has had benign SSCP and palpitations in past with normal echo in 2018 and normal myovue 06/05/20 EF 53%   Still with PTSD issues and sons health He aspirates and had a trach with multiple long hospitalizations  Seen 02/2022 for preoperative clearance Needs left shoulder scope with Dr Novella Olive   She has no cardiac complaints and no chest pain. She has had multiple neck surgeries and left foot reconstruction She has lymphedema and uses pump last 6 months   Normal myovue 06/05/20 TTE 03/29/22 EF 60-65% no significant valve dx She has had asymptomatic PVC;s in past Primary arranged heart monitor In May 2023 through [facility]  Results pending Dr Madelaine Bhat  who Has been through a lot with her son   Monitor reviewed average HR 100 bpm ( 63-134 ) No PAF 2% PVC burden  She had been on lopressor but stopped ? Due to dizziness bending over and more vertigo She has been on cardizem 60 mg bid    Soc Hx: Her husband is retired from the [employer]. This is her third marriage. They have been married x 17 yrs.  She has an adult son with Down's syndrome Natasha Wright) who has several cardiac issues.  Review of Systems: As per "subjective", otherwise negative.  Allergies  Allergen Reactions   Penicillins Shortness Of Breath and Rash    Has patient had a PCN reaction causing immediate  rash, facial/tongue/throat swelling, SOB or lightheadedness with hypotension: Yes Has patient had a PCN reaction causing severe rash involving mucus membranes or skin necrosis: No Has patient had a PCN reaction that required hospitalization Yes Has patient had a PCN reaction occurring within the last 10 years: No If all of the above answers are "NO", then may proceed with Cephalosporin use.    Sulfamethoxazole-Trimethoprim Shortness Of Breath and Rash   Codeine Nausea Only and Other (See Comments)    skin crawling   Fentanyl Nausea Only and Other (See Comments)    skin crawling, passed out   Morphine Nausea Only and Other (See Comments)    skin crawling   Adhesive [Tape] Other (See Comments)    Rips off skin, paper tape is ok   Influenza Vaccines Rash   Latex Rash   Nsaids Nausea Only    Current Outpatient Medications  Medication Sig Dispense Refill   acetaminophen (TYLENOL) 650 MG CR tablet Take 1,300 mg by mouth 2 (two) times daily as needed for pain.     citalopram (CELEXA) 20 MG tablet Take 1 tablet (20 mg total) by mouth 2 (two) times daily. (Patient taking differently: Take 10-20 mg by mouth 2 (two) times daily. Takes '10mg'$  in am and '20mg'$  in pm) 180 tablet 3   diazepam (VALIUM) 10 MG tablet Take 10 mg by mouth every 8 (eight) hours as needed (muscle spasms).     dicyclomine (BENTYL) 20  MG tablet Take 20 mg by mouth 4 (four) times daily as needed for spasms.     doxycycline (VIBRAMYCIN) 100 MG capsule Take 1 capsule (100 mg total) by mouth 2 (two) times daily. 20 capsule 0   estradiol (ESTRACE) 0.5 MG tablet Take 0.5 mg by mouth at bedtime.      fluticasone (FLONASE) 50 MCG/ACT nasal spray Place 1 spray into both nostrils 2 (two) times daily.     furosemide (LASIX) 20 MG tablet Take 20 mg by mouth every other day.     ipratropium (ATROVENT) 0.06 % nasal spray Place 2 sprays into both nostrils daily.     metoprolol tartrate (LOPRESSOR) 25 MG tablet Take 1 tablet (25 mg total) by  mouth 2 (two) times daily. 180 tablet 3   Multiple Vitamin (MULTIVITAMIN WITH MINERALS) TABS tablet Take 1 tablet by mouth daily.     pantoprazole (PROTONIX) 40 MG tablet TAKE 1 TABLET BY MOUTH 30 MINUTES PRIOR TO MEALS TWICE DAILY 60 tablet 11   No current facility-administered medications for this visit.    Past Medical History:  Diagnosis Date   Anxiety    Arthritis    B12 deficiency    h/o   Family history of heart disease    GERD (gastroesophageal reflux disease)    Hypoglycemia    IBS (irritable bowel syndrome)    Lumbar disc disease    OA (osteoarthritis)    PONV (postoperative nausea and vomiting)    needs scop patch   Rectocele    Shingles    Spinal stenosis in cervical region     Past Surgical History:  Procedure Laterality Date   ABDOMINAL HYSTERECTOMY  1992   ARTHRODESIS FOOT WITH WEIL OSTEOTOMY Left 05/09/2014   Procedure: LEFT FIRST TARSAL METATARSAL  ARTHRODESIS; LEFT SECOND WEIL OSTEOTOMY;  Surgeon: Wylene Simmer, MD;  Location: [facility];  Service: Orthopedics;  Laterality: Left;   BIOPSY  07/11/2018   Procedure: BIOPSY;  Surgeon: Danie Binder, MD;  [facility];  Service: Endoscopy;;  duodenal biopsy , gastric biopsy    BUNIONECTOMY WITH HAMMERTOE RECONSTRUCTION Left 05/09/2014   Procedure: LEFT MODIFIED MCBRIDE BUNIONECTOMY; LEFT SECOND HAMMER TOE CORRECTION;  Surgeon: Wylene Simmer, MD;  Location: [facility];  Service: Orthopedics;  Laterality: Left;   CERVICAL SPINE SURGERY  2009   CHOLECYSTECTOMY  2004   lap choli   COLONOSCOPY  07/08/2003   RMR: Left-sided diverticula, remainder of colonic mucosa and terminal ileum appeared normal.. Minimally friable internal hemorrhoids, otherwise normal rectum   COLONOSCOPY N/A 04/30/2013   Dr. Oneida Alar: mild diverticulosis in descending and sigmoid colon, internal hemorrhoids.    DILATION AND CURETTAGE OF UTERUS     ESOPHAGOGASTRODUODENOSCOPY (EGD) WITH PROPOFOL N/A 07/11/2018    Procedure: ESOPHAGOGASTRODUODENOSCOPY (EGD) WITH PROPOFOL;  Surgeon: Danie Binder, MD;  [facility];  Service: Endoscopy;  Laterality: N/A;  9:30am   NM MYOCAR PERF WALL MOTION  10/2011   bruce myoview - breast attenuation noted in anterior region; EF 65%; no ischemia/infarct/scar; low risk   SAVORY DILATION N/A 07/11/2018   Procedure: SAVORY DILATION;  Surgeon: Danie Binder, MD;  [facility];  Service: Endoscopy;  Laterality: N/A;   TONSILLECTOMY  1980   TUBAL LIGATION      Social History   Socioeconomic History   Marital status: Married    Spouse name: Mikeal Hawthorne   Number of children: 1   Years of education: 14   Highest  education level: Not on file  Occupational History   Occupation: Unemployed    Employer: [employer]    Employer: UNEMPLOYED  Tobacco Use   Smoking status: Former    Packs/day: 0.50    Years: 40.00    Total pack years: 20.00    Types: Cigarettes    Quit date: 12/24/2013    Years since quitting: 8.4   Smokeless tobacco: Never   Tobacco comments:    using vape as aid to sustain from smoking  Vaping Use   Vaping Use: Never used  Substance and Sexual Activity   Alcohol use: No   Drug use: No   Sexual activity: Not on file  Other Topics Concern   Not on file  Social History Narrative   One son, Down syndrome,is right handed ,resides with son and husband   Right handed   Social Determinants of Health   Financial Resource Strain: Not on file  Food Insecurity: Not on file  Transportation Needs: Not on file  Physical Activity: Not on file  Stress: Not on file  Social Connections: Not on file  Intimate Partner Violence: Not on file    Barbarann Ehlers, RN was present throughout the entirety of the encounter.  There were no vitals filed for this visit.   Wt Readings from Last 3 Encounters:  03/08/22 191 lb (86.6 kg)  04/09/21 187 lb (84.8 kg)  01/06/21 186 lb (84.4 kg)     PHYSICAL EXAM There were no vitals  taken for this visit. Affect appropriate Healthy:  appears stated age 21: normal Neck supple with no adenopathy JVP normal no bruits no thyromegaly Lungs clear with no wheezing and good diaphragmatic motion Heart:  S1/S2 no murmur, no rub, gallop or click PMI normal Abdomen: benighn, BS positve, no tenderness, no AAA no bruit.  No HSM or HJR Distal pulses intact with no bruits Plus one bilateral edema with stasis , varicosities and lymphedema  Neuro non-focal Skin warm and dry Decreased ROM left shoulder and neck   ECG: NSR normal 05/18/18  05/20/2022 NSR rate 81 normal 05/20/2022 NSR rate 84 PVC otherwise normal QT 394    Labs: Lab Results  Component Value Date/Time   K 4.8 02/10/2018 10:15 AM   BUN 14 02/10/2018 10:15 AM   CREATININE 0.80 10/13/2018 09:47 AM   ALT 18 02/10/2018 10:15 AM   TSH 1.337 03/30/2017 03:51 PM   TSH 1.382 12/06/2008 12:00 AM   HGB 14.6 02/10/2018 10:15 AM     Lipids: Lab Results  Component Value Date/Time   LDLCALC 87 06/11/2009 11:47 PM   CHOL 195 06/11/2009 11:47 PM   TRIG 238 (H) 06/11/2009 11:47 PM   HDL 60 06/11/2009 11:47 PM      Prior CV studies:   The following studies were reviewed today:   Echo 04/11/17:   Study Conclusions   - Left ventricle: The cavity size was normal. Wall thickness was   increased in a pattern of mild LVH. Systolic function was normal.   The estimated ejection fraction was in the range of 55% to 60%.   Wall motion was normal; there were no regional wall motion   abnormalities. There was a borderline abnormality in the ratio of   early to atrial left ventricular filling.   Myovue:  06/05/20:   Study Highlights  There was no ST segment deviation noted during stress. Nuclear stress EF: 53%. The left ventricular ejection fraction is normal (55-65%). Visually, the EF is higher than  the computer calculated 53%. This is a low risk study. There is no evidence of ischemia or previous infarction . The study is  normal.      ASSESSMENT AND PLAN:  1.  Palpitations: benign  Monitor < 2% PVC burden beta blocker would be better than CCB Will try Atenolol 50 mg daily Doubt the lopressor precipitated vertigo or dizziness She already has f/u in August with Dr Joan Mayans who ordered the monitor  2.  Chest pain:Atypical normal myovue in 2018 and most recently 06/05/20 observe  3.  Anxiety and stress: Controlled on citalopram 10 mg q am and 20 mg q pm. 4. Lymphedema:  using pump using lasix qod   Time spent reviewing chart Dr Joan Mayans notes Reading monitor TTE and myovue patient interview Exam and composing noted 60 minutes  Disposition: Follow up  in a year    Jenkins Rouge MD [facility]

## 2022-05-26 ENCOUNTER — Ambulatory Visit: Payer: Medicare Other | Admitting: Cardiovascular Disease

## 2022-05-26 ENCOUNTER — Encounter: Payer: Self-pay | Admitting: Cardiovascular Disease

## 2022-05-26 VITALS — BP 136/90 | HR 91 | Ht 67.0 in | Wt 197.2 lb

## 2022-05-26 DIAGNOSIS — R002 Palpitations: Secondary | ICD-10-CM

## 2022-05-26 DIAGNOSIS — R072 Precordial pain: Secondary | ICD-10-CM | POA: Diagnosis not present

## 2022-05-26 DIAGNOSIS — I493 Ventricular premature depolarization: Secondary | ICD-10-CM | POA: Diagnosis not present

## 2022-05-26 DIAGNOSIS — I890 Lymphedema, not elsewhere classified: Secondary | ICD-10-CM | POA: Diagnosis not present

## 2022-05-26 MED ORDER — ATENOLOL 50 MG PO TABS: 50.0000 mg | ORAL_TABLET | Freq: Every day | ORAL | 3 refills | Status: DC

## 2022-05-26 NOTE — Patient Instructions (Signed)
Medication Instructions:  Your physician has recommended you make the following change in your medication:   Stop Talking Cardizem  Atenolol 50 mg Daily    *If you need a refill on your cardiac medications before your next appointment, please call your pharmacy*   Lab Work: NONE   If you have labs (blood work) drawn today and your tests are completely normal, you will receive your results only by: [place] (if you have MyChart) OR A paper copy in the mail If you have any lab test that is abnormal or we need to change your treatment, we will call you to review the results.   Testing/Procedures: NONE    Follow-Up: At [facility], you and your health needs are our priority.  As part of our continuing mission to provide you with exceptional heart care, we have created designated Provider Care Teams.  These Care Teams include your primary Cardiologist (physician) and Advanced Practice Providers (APPs -  Physician Assistants and Nurse Practitioners) who all work together to provide you with the care you need, when you need it.  We recommend signing up for the patient portal called "MyChart".  Sign up information is provided on this After Visit Summary.  MyChart is used to connect with patients for Virtual Visits (Telemedicine).  Patients are able to view lab/test results, encounter notes, upcoming appointments, etc.  Non-urgent messages can be sent to your provider as well.   To learn more about what you can do with MyChart, go to [email or web address].    Your next appointment:   1 year(s)  The format for your next appointment:   In Person  Provider:   Jenkins Rouge, MD    Other Instructions Thank you for choosing [facility]!    Important Information About Sugar

## 2022-06-01 DIAGNOSIS — I83893 Varicose veins of bilateral lower extremities with other complications: Secondary | ICD-10-CM | POA: Diagnosis not present

## 2022-06-01 DIAGNOSIS — I890 Lymphedema, not elsewhere classified: Secondary | ICD-10-CM | POA: Diagnosis not present

## 2022-06-08 ENCOUNTER — Other Ambulatory Visit: Payer: Self-pay

## 2022-06-08 MED ORDER — ATENOLOL 50 MG PO TABS: 50.0000 mg | ORAL_TABLET | Freq: Every day | ORAL | 3 refills | Status: DC

## 2022-06-09 ENCOUNTER — Other Ambulatory Visit: Payer: Self-pay | Admitting: Orthopaedic Surgery

## 2022-06-09 DIAGNOSIS — M25561 Pain in right knee: Secondary | ICD-10-CM

## 2022-06-19 ENCOUNTER — Ambulatory Visit
Admission: RE | Admit: 2022-06-19 | Discharge: 2022-06-19 | Disposition: A | Discharge status: Home / Self Care | Payer: Medicare Other | Source: Ambulatory Visit | Attending: Orthopaedic Surgery | Admitting: Orthopaedic Surgery

## 2022-06-19 DIAGNOSIS — M25561 Pain in right knee: Secondary | ICD-10-CM | POA: Diagnosis not present

## 2022-07-02 DIAGNOSIS — M25561 Pain in right knee: Secondary | ICD-10-CM | POA: Diagnosis not present

## 2022-07-05 DIAGNOSIS — Z79899 Other long term (current) drug therapy: Secondary | ICD-10-CM | POA: Diagnosis not present

## 2022-07-05 DIAGNOSIS — I493 Ventricular premature depolarization: Secondary | ICD-10-CM | POA: Diagnosis not present

## 2022-07-05 DIAGNOSIS — I499 Cardiac arrhythmia, unspecified: Secondary | ICD-10-CM | POA: Diagnosis not present

## 2022-07-05 DIAGNOSIS — R9431 Abnormal electrocardiogram [ECG] [EKG]: Secondary | ICD-10-CM | POA: Diagnosis not present

## 2022-07-19 ENCOUNTER — Telehealth: Payer: Self-pay | Admitting: *Deleted

## 2022-07-19 NOTE — Telephone Encounter (Signed)
   Name: Natasha Wright  DOB: [birth year]  MRN: [id number]  Primary Cardiologist: Kate Sable, MD (Inactive)   Preoperative team, please contact this patient and set up a phone call appointment for further preoperative risk assessment. Please obtain consent and complete medication review. Thank you for your help.  I confirm that guidance regarding antiplatelet and oral anticoagulation therapy has been completed and, if necessary, noted below.   Richardson Dopp, PA-C 07/19/2022, 12:29 PM [place]

## 2022-07-19 NOTE — Telephone Encounter (Signed)
   Pre-operative Risk Assessment    Patient Name: Natasha Wright  DOB: 26-May-1961 MRN: [id number]      Request for Surgical Clearance    Procedure:   RIGHT KNEE ARTHROSCOPY  Date of Surgery:  Clearance 08/12/22                                 Surgeon:  Melrose Nakayama, MD Surgeon's Group or Practice Name:  [facility] Phone number:  [phone] Fax number:  [phone]   Type of Clearance Requested:   - Medical    Type of Anesthesia:   CHOICE   Additional requests/questions:    Astrid Divine   07/19/2022, 12:16 PM

## 2022-07-19 NOTE — Telephone Encounter (Signed)
Pt agreeable to plan of care tele pre op  appt 07/28/22. Med rec and consent are done.     Patient Consent for Virtual Visit        Natasha Wright has provided verbal consent on 07/19/2022 for a virtual visit (video or telephone).   CONSENT FOR VIRTUAL VISIT FOR:  Natasha Wright  By participating in this virtual visit I agree to the following:  I hereby voluntarily request, consent and authorize [facility] and its employed or contracted physicians, physician assistants, nurse practitioners or other licensed health care professionals (the Practitioner), to provide me with telemedicine health care services (the "Services") as deemed necessary by the treating Practitioner. I acknowledge and consent to receive the Services by the Practitioner via telemedicine. I understand that the telemedicine visit will involve communicating with the Practitioner through live audiovisual communication technology and the disclosure of certain medical information by electronic transmission. I acknowledge that I have been given the opportunity to request an in-person assessment or other available alternative prior to the telemedicine visit and am voluntarily participating in the telemedicine visit.  I understand that I have the right to withhold or withdraw my consent to the use of telemedicine in the course of my care at any time, without affecting my right to future care or treatment, and that the Practitioner or I may terminate the telemedicine visit at any time. I understand that I have the right to inspect all information obtained and/or recorded in the course of the telemedicine visit and may receive copies of available information for a reasonable fee.  I understand that some of the potential risks of receiving the Services via telemedicine include:  Delay or interruption in medical evaluation due to technological equipment failure or disruption; Information transmitted may not be sufficient (e.g.  poor resolution of images) to allow for appropriate medical decision making by the Practitioner; and/or  In rare instances, security protocols could fail, causing a breach of personal health information.  Furthermore, I acknowledge that it is my responsibility to provide information about my medical history, conditions and care that is complete and accurate to the best of my ability. I acknowledge that Practitioner's advice, recommendations, and/or decision may be based on factors not within their control, such as incomplete or inaccurate data provided by me or distortions of diagnostic images or specimens that may result from electronic transmissions. I understand that the practice of medicine is not an exact science and that Practitioner makes no warranties or guarantees regarding treatment outcomes. I acknowledge that a copy of this consent can be made available to me via my patient portal ([facility]), or I can request a printed copy by calling the office of [facility].    I understand that my insurance will be billed for this visit.   I have read or had this consent read to me. I understand the contents of this consent, which adequately explains the benefits and risks of the Services being provided via telemedicine.  I have been provided ample opportunity to ask questions regarding this consent and the Services and have had my questions answered to my satisfaction. I give my informed consent for the services to be provided through the use of telemedicine in my medical care

## 2022-07-19 NOTE — Telephone Encounter (Signed)
Pt agreeable to plan of care tele pre op  appt 07/28/22. Med rec and consent are done.

## 2022-07-22 DIAGNOSIS — I872 Venous insufficiency (chronic) (peripheral): Secondary | ICD-10-CM | POA: Diagnosis not present

## 2022-07-28 ENCOUNTER — Ambulatory Visit: Payer: Medicare Other | Attending: Cardiology | Admitting: Nurse Practitioner

## 2022-07-28 ENCOUNTER — Encounter: Payer: Self-pay | Admitting: Nurse Practitioner

## 2022-07-28 DIAGNOSIS — Z01810 Encounter for preprocedural cardiovascular examination: Secondary | ICD-10-CM

## 2022-07-28 NOTE — Progress Notes (Signed)
Virtual Visit via Telephone Note   Because of Natasha Wright's co-morbid illnesses, she is at least at moderate risk for complications without adequate follow up.  This format is felt to be most appropriate for this patient at this time.  The patient did not have access to video technology/had technical difficulties with video requiring transitioning to audio format only (telephone).  All issues noted in this document were discussed and addressed.  No physical exam could be performed with this format.  Please refer to the patient's chart for her consent to telehealth for [facility].  Evaluation Performed:  Preoperative cardiovascular risk assessment _____________   Date:  07/28/2022   Patient ID:  Natasha Wright, DOB [birth year], MRN [id number] Patient Location:  Home Provider location:   Office  Primary Care Provider:  Redmond School, MD Primary Cardiologist:  Jenkins Rouge, MD  Chief Complaint / Patient Profile   61 y.o. y/o female with a h/o chest pain, normal NST 2021, tachycardia, lymphedema, and palpitations who is pending right knee arthroscopy and presents today for telephonic preoperative cardiovascular risk assessment.  Past Medical History    Past Medical History:  Diagnosis Date   Anxiety    Arthritis    B12 deficiency    h/o   Family history of heart disease    GERD (gastroesophageal reflux disease)    Hypoglycemia    IBS (irritable bowel syndrome)    Lumbar disc disease    OA (osteoarthritis)    PONV (postoperative nausea and vomiting)    needs scop patch   Rectocele    Shingles    Spinal stenosis in cervical region    Past Surgical History:  Procedure Laterality Date   ABDOMINAL HYSTERECTOMY  1992   ARTHRODESIS FOOT WITH WEIL OSTEOTOMY Left 05/09/2014   Procedure: LEFT FIRST TARSAL METATARSAL  ARTHRODESIS; LEFT SECOND WEIL OSTEOTOMY;  Surgeon: Wylene Simmer, MD;  Location: [facility];  Service: Orthopedics;  Laterality: Left;    BIOPSY  07/11/2018   Procedure: BIOPSY;  Surgeon: Danie Binder, MD;  [facility];  Service: Endoscopy;;  duodenal biopsy , gastric biopsy    BUNIONECTOMY WITH HAMMERTOE RECONSTRUCTION Left 05/09/2014   Procedure: LEFT MODIFIED MCBRIDE BUNIONECTOMY; LEFT SECOND HAMMER TOE CORRECTION;  Surgeon: Wylene Simmer, MD;  Location: [facility];  Service: Orthopedics;  Laterality: Left;   CERVICAL SPINE SURGERY  2009   CHOLECYSTECTOMY  2004   lap choli   COLONOSCOPY  07/08/2003   RMR: Left-sided diverticula, remainder of colonic mucosa and terminal ileum appeared normal.. Minimally friable internal hemorrhoids, otherwise normal rectum   COLONOSCOPY N/A 04/30/2013   Dr. Oneida Alar: mild diverticulosis in descending and sigmoid colon, internal hemorrhoids.    DILATION AND CURETTAGE OF UTERUS     ESOPHAGOGASTRODUODENOSCOPY (EGD) WITH PROPOFOL N/A 07/11/2018   Procedure: ESOPHAGOGASTRODUODENOSCOPY (EGD) WITH PROPOFOL;  Surgeon: Danie Binder, MD;  [facility];  Service: Endoscopy;  Laterality: N/A;  9:30am   NM MYOCAR PERF WALL MOTION  10/2011   bruce myoview - breast attenuation noted in anterior region; EF 65%; no ischemia/infarct/scar; low risk   SAVORY DILATION N/A 07/11/2018   Procedure: SAVORY DILATION;  Surgeon: Danie Binder, MD;  [facility];  Service: Endoscopy;  Laterality: N/A;   SHOULDER SURGERY     TONSILLECTOMY  1980   TUBAL LIGATION      Allergies  Allergies  Allergen Reactions   Penicillins Shortness Of Breath and Rash  Has patient had a PCN reaction causing immediate rash, facial/tongue/throat swelling, SOB or lightheadedness with hypotension: Yes Has patient had a PCN reaction causing severe rash involving mucus membranes or skin necrosis: No Has patient had a PCN reaction that required hospitalization Yes Has patient had a PCN reaction occurring within the last 10 years: No If all of the above answers are "NO", then may  proceed with Cephalosporin use.    Sulfamethoxazole-Trimethoprim Shortness Of Breath and Rash   Codeine Nausea Only and Other (See Comments)    skin crawling   Fentanyl Nausea Only and Other (See Comments)    skin crawling, passed out   Morphine Nausea Only and Other (See Comments)    skin crawling   Adhesive [Tape] Other (See Comments)    Rips off skin, paper tape is ok   Influenza Vaccines Rash   Latex Rash   Nsaids Nausea Only    History of Present Illness    Natasha Wright is a 61 y.o. female who presents via audio/video conferencing for a telehealth visit today.  Pt was last seen in cardiology clinic on 05/26/22 by Dr. [name].  At that time Natasha Wright was doing well.  The patient is now pending procedure as outlined above. Since her last visit, she denies chest pain, shortness of breath, fatigue, palpitations, melena, hematuria, hemoptysis, diaphoresis, weakness, presyncope, syncope, orthopnea, and PND. Reports heart rate has improved since starting beta-blocker. Has a history of lymphedema, no significant change recently.   Home Medications    Prior to Admission medications   Medication Sig Start Date End Date Taking? Authorizing Provider  acetaminophen (TYLENOL) 650 MG CR tablet Take 1,300 mg by mouth 2 (two) times daily as needed for pain.    [provider]  atenolol (TENORMIN) 50 MG tablet Take 1 tablet (50 mg total) by mouth daily. 06/08/22 06/03/23  Josue Hector, MD  citalopram (CELEXA) 20 MG tablet Take 1 tablet (20 mg total) by mouth 2 (two) times daily. Patient taking differently: Take 10-20 mg by mouth 2 (two) times daily. Takes '10mg'$  in am and '20mg'$  in pm 08/04/18   Herminio Commons, MD  diazepam (VALIUM) 10 MG tablet Take 10 mg by mouth every 8 (eight) hours as needed (muscle spasms). Patient not taking: Reported on 05/26/2022    [provider]  dicyclomine (BENTYL) 20 MG tablet Take 20 mg by mouth 4 (four) times daily as needed for spasms.     [provider]  doxycycline (VIBRAMYCIN) 100 MG capsule Take 1 capsule (100 mg total) by mouth 2 (two) times daily. 06/07/21   Scot Jun, FNP  estradiol (ESTRACE) 0.5 MG tablet Take 0.5 mg by mouth at bedtime.     [provider]  fluticasone (FLONASE) 50 MCG/ACT nasal spray Place 1 spray into both nostrils 2 (two) times daily.    [provider]  furosemide (LASIX) 20 MG tablet Take 20 mg by mouth every other day. 12/30/20   [provider]  ipratropium (ATROVENT) 0.06 % nasal spray Place 2 sprays into both nostrils daily.    [provider]  Multiple Vitamin (MULTIVITAMIN WITH MINERALS) TABS tablet Take 1 tablet by mouth daily.    [provider]  pantoprazole (PROTONIX) 40 MG tablet TAKE 1 TABLET BY MOUTH 30 MINUTES PRIOR TO MEALS TWICE DAILY 03/05/19   Mahala Menghini, PA-C    Physical Exam    Vital Signs:  Natasha Wright does not have vital signs  available for review today.  Given telephonic nature of communication, physical exam is limited. AAOx3. NAD. Normal affect.  Speech and respirations are unlabored.  Accessory Clinical Findings    None  Assessment & Plan    1.  Preoperative Cardiovascular Risk Assessment: The patient is doing well from a cardiac perspective. Therefore, based on ACC/AHA guidelines, the patient would be at acceptable risk for the planned procedure without further cardiovascular testing. The patient was advised that if he develops new symptoms prior to surgery to contact our office to arrange for a follow-up visit, and he verbalized understanding. According to the Revised Cardiac Risk Index (RCRI), her Perioperative Risk of Major Cardiac Event is (%): 0.9, Her Functional Capacity in METs is: 5.07 according to the Duke Activity Status Index (DASI).  No request to hold medications.   A copy of this note will be routed to requesting surgeon.  Time:   Today, I have spent 10 minutes with the patient with  telehealth technology discussing medical history, symptoms, and management plan.     Emmaline Life, NP-C     07/28/2022, 10:21 AM [address] Office [phone] Fax [phone]

## 2022-07-30 DIAGNOSIS — Z09 Encounter for follow-up examination after completed treatment for conditions other than malignant neoplasm: Secondary | ICD-10-CM | POA: Diagnosis not present

## 2022-08-12 DIAGNOSIS — S83231A Complex tear of medial meniscus, current injury, right knee, initial encounter: Secondary | ICD-10-CM | POA: Diagnosis not present

## 2022-08-12 DIAGNOSIS — M2241 Chondromalacia patellae, right knee: Secondary | ICD-10-CM | POA: Diagnosis not present

## 2022-08-12 DIAGNOSIS — G8918 Other acute postprocedural pain: Secondary | ICD-10-CM | POA: Diagnosis not present

## 2022-08-12 DIAGNOSIS — M948X6 Other specified disorders of cartilage, lower leg: Secondary | ICD-10-CM | POA: Diagnosis not present

## 2022-08-18 DIAGNOSIS — M23211 Derangement of anterior horn of medial meniscus due to old tear or injury, right knee: Secondary | ICD-10-CM | POA: Diagnosis not present

## 2022-08-18 DIAGNOSIS — S83241D Other tear of medial meniscus, current injury, right knee, subsequent encounter: Secondary | ICD-10-CM | POA: Diagnosis not present

## 2022-09-07 ENCOUNTER — Telehealth: Payer: Self-pay | Admitting: Cardiovascular Disease

## 2022-09-07 NOTE — Telephone Encounter (Signed)
Patient would like to transfer providers. Would like to transfer from Dr. [name] to Dr. Gasper Sells. Please confirm transfer.

## 2022-09-08 DIAGNOSIS — M25531 Pain in right wrist: Secondary | ICD-10-CM | POA: Diagnosis not present

## 2022-09-16 DIAGNOSIS — M1812 Unilateral primary osteoarthritis of first carpometacarpal joint, left hand: Secondary | ICD-10-CM | POA: Diagnosis not present

## 2022-09-16 DIAGNOSIS — M1811 Unilateral primary osteoarthritis of first carpometacarpal joint, right hand: Secondary | ICD-10-CM | POA: Diagnosis not present

## 2022-09-17 ENCOUNTER — Ambulatory Visit: Payer: Medicare Other | Attending: Internal Medicine | Admitting: Internal Medicine

## 2022-09-17 ENCOUNTER — Encounter: Payer: Self-pay | Admitting: Internal Medicine

## 2022-09-17 VITALS — BP 106/74 | HR 70 | Ht 67.0 in | Wt 198.2 lb

## 2022-09-17 DIAGNOSIS — I493 Ventricular premature depolarization: Secondary | ICD-10-CM | POA: Insufficient documentation

## 2022-09-17 DIAGNOSIS — R600 Localized edema: Secondary | ICD-10-CM | POA: Diagnosis not present

## 2022-09-17 DIAGNOSIS — R072 Precordial pain: Secondary | ICD-10-CM | POA: Diagnosis not present

## 2022-09-17 DIAGNOSIS — I517 Cardiomegaly: Secondary | ICD-10-CM | POA: Diagnosis not present

## 2022-09-17 MED ORDER — METOPROLOL TARTRATE 100 MG PO TABS: ORAL_TABLET | ORAL | 0 refills | Status: DC

## 2022-09-17 MED ORDER — ATENOLOL 25 MG PO TABS: 25.0000 mg | ORAL_TABLET | Freq: Every day | ORAL | 3 refills | Status: DC

## 2022-09-17 NOTE — Patient Instructions (Addendum)
Medication Instructions:  Your physician has recommended you make the following change in your medication:  DECREASE: atenolol (Tenormin) to 25 mg by mouth once daily  *If you need a refill on your cardiac medications before your next appointment, please call your pharmacy*   Lab Work: TODAY: BMP, BNP, TSH  If you have labs (blood work) drawn today and your tests are completely normal, you will receive your results only by: [place] (if you have MyChart) OR A paper copy in the mail If you have any lab test that is abnormal or we need to change your treatment, we will call you to review the results.   Testing/Procedures: Your physician has requested that you have cardiac CT. Cardiac computed tomography (CT) is a painless test that uses an x-ray machine to take clear, detailed pictures of your heart. For further information please visit [email or web address].[email or web address]. Please follow instruction sheet as given.     Follow-Up: At [facility], you and your health needs are our priority.  As part of our continuing mission to provide you with exceptional heart care, we have created designated Provider Care Teams.  These Care Teams include your primary Cardiologist (physician) and Advanced Practice Providers (APPs -  Physician Assistants and Nurse Practitioners) who all work together to provide you with the care you need, when you need it.   Your next appointment:   3-4 month(s)  The format for your next appointment:   In Person  Provider:   Rudean Haskell, MD     Other Instructions   Your cardiac CT will be scheduled at one of the below locations:   [facility] [address] [place], [place] [address] [phone]  [facility] [address] [place], [place] [address] [phone]  [facility], [place] [address] [phone]  If scheduled  at [facility], please arrive at the [facility] and Children's Entrance (Entrance C2) of [facility] 30 minutes prior to test start time. You can use the FREE valet parking offered at entrance C (encouraged to control the heart rate for the test)  Proceed to the [facility] Radiology Department (first floor) to check-in and test prep.  All radiology patients and guests should use entrance C2 at [facility], accessed from [facility], even though the hospital's physical address listed is [address].    If scheduled at [facility] or [facility], please arrive 15 mins early for check-in and test prep.   Please follow these instructions carefully (unless otherwise directed):  Hold all erectile dysfunction medications at least 3 days (72 hrs) prior to test. (Ie viagra, cialis, sildenafil, tadalafil, etc) We will administer nitroglycerin during this exam.   On the Night Before the Test: Be sure to Drink plenty of water. Do not consume any caffeinated/decaffeinated beverages or chocolate 12 hours prior to your test. Do not take any antihistamines 12 hours prior to your test.-  - Flonase   On the Day of the Test: Drink plenty of water until 1 hour prior to the test. Do not eat any food 1 hour prior to test. You may take your regular medications prior to the test.  Take metoprolol (Lopressor) 100 mg by mouth two hours prior to test. HOLD Furosemide morning of the test. FEMALES- please wear underwire-free bra if available, avoid dresses & tight clothing  After the Test: Drink plenty of water. After receiving IV contrast, you may experience a mild flushed feeling. This is normal. On occasion, you may experience a mild rash up to 24 hours after the test. This is not dangerous. If this occurs, you can take Benadryl 25 mg and increase your fluid intake. If you experience trouble breathing, this can  be serious. If it is severe call 911 IMMEDIATELY. If it is mild, please call our office. If you take any of these medications: Glipizide/Metformin, Avandament, Glucavance, please do not take 48 hours after completing test unless otherwise instructed.  We will call to schedule your test 2-4 weeks out understanding that some insurance companies will need an authorization prior to the service being performed.   For non-scheduling related questions, please contact the cardiac imaging nurse navigator should you have any questions/concerns: Marchia Bond, Cardiac Imaging Nurse Navigator Gordy Clement, Cardiac Imaging Nurse Navigator [facility] Direct Office Dial: [phone]   For scheduling needs, including cancellations and rescheduling, please call [place], [phone].   Important Information About [facility]

## 2022-09-17 NOTE — Progress Notes (Signed)
Cardiology Office Note:    Date:  09/17/2022   ID:  Natasha Wright, DOB [birth year], MRN [id number]  PCP:  Redmond School, MD   [place] [facility] Cardiologist:  Jenkins Rouge, MD     Referring MD: Redmond School, MD   CC: Transition to new cardiologist  History of Present Illness:    Natasha Wright is a 61 y.o. female with recent right knee arthroscopy. 2023: hx of PVCs see [facility] Dr. Joan Mayans in the past. On low dose BB and PRN lasix. Saw Dr. Joan Mayans son has had multiple ablations.  Has been on life support.  Patient notes that she is dealing with a lot of support issues with her son who has been through a lot. Has had a lot of palpitations and was told she has PVCs. Had been planned to start beta blocker. Felt issues with metoprolol and feelt dizzy. Went back to cardizem.   Saw Dr. Joan Mayans- occasional sometimes symptomatic PVCs.  Gets hot but doesn't get hot flashed.   Has had low blood pressure on atenolol but feels a bit hot.   Has multiple surgeries coming up; next is R writs with Dr. Grandville Silos in January. Gaining weight; has compression stockings.  Has been taking the lasix every other day.  Doesn't know changes with weight.    Past Medical History:  Diagnosis Date   Anxiety    Arthritis    B12 deficiency    h/o   Family history of heart disease    GERD (gastroesophageal reflux disease)    Hypoglycemia    IBS (irritable bowel syndrome)    Lumbar disc disease    OA (osteoarthritis)    PONV (postoperative nausea and vomiting)    needs scop patch   Rectocele    Shingles    Spinal stenosis in cervical region     Past Surgical History:  Procedure Laterality Date   ABDOMINAL HYSTERECTOMY  1992   ARTHRODESIS FOOT WITH WEIL OSTEOTOMY Left 05/09/2014   Procedure: LEFT FIRST TARSAL METATARSAL  ARTHRODESIS; LEFT SECOND WEIL OSTEOTOMY;  Surgeon: Wylene Simmer, MD;  Location: [place];  Service: Orthopedics;  Laterality: Left;   BIOPSY   07/11/2018   Procedure: BIOPSY;  Surgeon: Danie Binder, MD;  [facility];  Service: Endoscopy;;  duodenal biopsy , gastric biopsy    BUNIONECTOMY WITH HAMMERTOE RECONSTRUCTION Left 05/09/2014   Procedure: LEFT MODIFIED MCBRIDE BUNIONECTOMY; LEFT SECOND HAMMER TOE CORRECTION;  Surgeon: Wylene Simmer, MD;  Location: [place];  Service: Orthopedics;  Laterality: Left;   CERVICAL SPINE SURGERY  2009   CHOLECYSTECTOMY  2004   lap choli   COLONOSCOPY  07/08/2003   RMR: Left-sided diverticula, remainder of colonic mucosa and terminal ileum appeared normal.. Minimally friable internal hemorrhoids, otherwise normal rectum   COLONOSCOPY N/A 04/30/2013   Dr. Oneida Alar: mild diverticulosis in descending and sigmoid colon, internal hemorrhoids.    DILATION AND CURETTAGE OF UTERUS     ESOPHAGOGASTRODUODENOSCOPY (EGD) WITH PROPOFOL N/A 07/11/2018   Procedure: ESOPHAGOGASTRODUODENOSCOPY (EGD) WITH PROPOFOL;  Surgeon: Danie Binder, MD;  [facility];  Service: Endoscopy;  Laterality: N/A;  9:30am   NM MYOCAR PERF WALL MOTION  10/2011   bruce myoview - breast attenuation noted in anterior region; EF 65%; no ischemia/infarct/scar; low risk   SAVORY DILATION N/A 07/11/2018   Procedure: SAVORY DILATION;  Surgeon: Danie Binder, MD;  [facility];  Service: Endoscopy;  Laterality: N/A;   SHOULDER  SURGERY     TONSILLECTOMY  1980   TUBAL LIGATION      Current Medications: Current Meds  Medication Sig   acetaminophen (TYLENOL) 650 MG CR tablet Take 1,300 mg by mouth 2 (two) times daily as needed for pain.   atenolol (TENORMIN) 25 MG tablet Take 1 tablet (25 mg total) by mouth daily.   citalopram (CELEXA) 20 MG tablet Take 1 tablet (20 mg total) by mouth 2 (two) times daily.   diazepam (VALIUM) 10 MG tablet Take 10 mg by mouth every 8 (eight) hours as needed (muscle spasms).   dicyclomine (BENTYL) 20 MG tablet Take 20 mg by mouth 4 (four) times daily as  needed for spasms.   estradiol (ESTRACE) 0.5 MG tablet Take 0.5 mg by mouth at bedtime.    fluticasone (FLONASE) 50 MCG/ACT nasal spray Place 1 spray into both nostrils 2 (two) times daily.   furosemide (LASIX) 20 MG tablet Take 20 mg by mouth every other day.   ipratropium (ATROVENT) 0.06 % nasal spray Place 2 sprays into both nostrils daily.   metoprolol tartrate (LOPRESSOR) 100 MG tablet Take 2 hours prior to Cardiac CT   Multiple Vitamin (MULTIVITAMIN WITH MINERALS) TABS tablet Take 1 tablet by mouth daily.   pantoprazole (PROTONIX) 40 MG tablet TAKE 1 TABLET BY MOUTH 30 MINUTES PRIOR TO MEALS TWICE DAILY   [DISCONTINUED] atenolol (TENORMIN) 50 MG tablet Take 1 tablet (50 mg total) by mouth daily.     Allergies:   Penicillins, Sulfamethoxazole-trimethoprim, Codeine, Fentanyl, Morphine, Adhesive [tape], Influenza vaccines, Latex, and Nsaids   Social History   Socioeconomic History   Marital status: Married    Spouse name: Mikeal Hawthorne   Number of children: 1   Years of education: 14   Highest education level: Not on file  Occupational History   Occupation: Unemployed    Employer: [employer]    Employer: UNEMPLOYED  Tobacco Use   Smoking status: Former    Packs/day: 0.50    Years: 40.00    Total pack years: 20.00    Types: Cigarettes    Quit date: 12/24/2013    Years since quitting: 8.7   Smokeless tobacco: Never   Tobacco comments:    using vape as aid to sustain from smoking  Vaping Use   Vaping Use: Never used  Substance and Sexual Activity   Alcohol use: No   Drug use: No   Sexual activity: Not on file  Other Topics Concern   Not on file  Social History Narrative   One son, Down syndrome,is right handed ,resides with son and husband   Right handed   Social Determinants of Health   Financial Resource Strain: Not on file  Food Insecurity: Not on file  Transportation Needs: Not on file  Physical Activity: Not on file  Stress: Not on file  Social  Connections: Not on file     Family History: The patient's family history includes Asthma in her maternal grandmother; Breast cancer in her maternal grandmother; Diabetes in her maternal grandmother, mother, paternal grandmother, and sister; Down syndrome in her child; Heart disease in her child, paternal grandfather, and paternal grandmother; Hyperlipidemia in her brother, mother, and sister; Hypertension in her maternal grandmother, mother, and sister; Stroke in her maternal grandfather, maternal grandmother, and paternal grandmother; Ulcerative colitis in her father. There is no history of Colon cancer.  ROS:   Please see the history of present illness.     All other systems reviewed and are  negative.  EKGs/Labs/Other Studies Reviewed:    The following studies were reviewed today:  Cardiac Studies & Procedures     STRESS TESTS  MYOCARDIAL PERFUSION IMAGING 06/05/2020  Narrative  There was no ST segment deviation noted during stress.  Nuclear stress EF: 53%. The left ventricular ejection fraction is normal (55-65%). Visually, the EF is higher than the computer calculated 53%.  This is a low risk study. There is no evidence of ischemia or previous infarction .  The study is normal.   ECHOCARDIOGRAM  ECHOCARDIOGRAM COMPLETE 03/29/2022  Narrative ECHOCARDIOGRAM REPORT    Patient Name:   JERLISA DILIBERTO Lina Date of Exam: 03/29/2022 Medical Rec #:  [id number]        Height:       67.5 in Accession #:    [id number]       Weight:       191.0 lb Date of Birth:  Feb 16, 1961       BSA:          1.994 m Patient Age:    51 years         BP:           133/82 mmHg Patient Gender: F                HR:           87 bpm. Exam Location:  [facility]  Procedure: 2D Echo, Cardiac Doppler and Color Doppler  Indications:    PVC's  History:        Patient has prior history of Echocardiogram examinations, most recent 04/11/2017. Arrythmias:PVC, Signs/Symptoms:Fatigue, Chest Pain and Dyspnea; Risk  Factors:Former Smoker.  Sonographer:    Wenda Low Referring Phys: [place]   1. Left ventricular ejection fraction, by estimation, is 60 to 65%. The left ventricle has normal function. The left ventricle has no regional wall motion abnormalities. Left ventricular diastolic parameters were normal. 2. Right ventricular systolic function is normal. The right ventricular size is normal. There is normal pulmonary artery systolic pressure. The estimated right ventricular systolic pressure is 93.7 mmHg. 3. The mitral valve is grossly normal. Trivial mitral valve regurgitation. No evidence of mitral stenosis. 4. The aortic valve is tricuspid. Aortic valve regurgitation is not visualized. No aortic stenosis is present. 5. The inferior vena cava is normal in size with greater than 50% respiratory variability, suggesting right atrial pressure of 3 mmHg.  FINDINGS Left Ventricle: Left ventricular ejection fraction, by estimation, is 60 to 65%. The left ventricle has normal function. The left ventricle has no regional wall motion abnormalities. The left ventricular internal cavity size was normal in size. There is no left ventricular hypertrophy. Left ventricular diastolic parameters were normal.  Right Ventricle: The right ventricular size is normal. No increase in right ventricular wall thickness. Right ventricular systolic function is normal. There is normal pulmonary artery systolic pressure. The tricuspid regurgitant velocity is 2.14 m/s, and with an assumed right atrial pressure of 3 mmHg, the estimated right ventricular systolic pressure is 90.2 mmHg.  Left Atrium: Left atrial size was normal in size.  Right Atrium: Right atrial size was normal in size.  Pericardium: There is no evidence of pericardial effusion.  Mitral Valve: The mitral valve is grossly normal. Trivial mitral valve regurgitation. No evidence of mitral valve stenosis. MV peak gradient, 5.2 mmHg.  The mean mitral valve gradient is 2.0 mmHg.  Tricuspid Valve: The tricuspid valve is grossly normal. Tricuspid valve regurgitation is trivial.  No evidence of tricuspid stenosis.  Aortic Valve: The aortic valve is tricuspid. Aortic valve regurgitation is not visualized. No aortic stenosis is present. Aortic valve mean gradient measures 3.0 mmHg. Aortic valve peak gradient measures 4.7 mmHg. Aortic valve area, by VTI measures 2.53 cm.  Pulmonic Valve: The pulmonic valve was grossly normal. Pulmonic valve regurgitation is not visualized. No evidence of pulmonic stenosis.  Aorta: The aortic root and ascending aorta are structurally normal, with no evidence of dilitation.  Venous: The right lower pulmonary vein is normal. The inferior vena cava is normal in size with greater than 50% respiratory variability, suggesting right atrial pressure of 3 mmHg.  IAS/Shunts: The atrial septum is grossly normal.   LEFT VENTRICLE PLAX 2D LVIDd:         3.90 cm     Diastology LVIDs:         2.90 cm     LV e' medial:    9.36 cm/s LV PW:         1.10 cm     LV E/e' medial:  7.6 LV IVS:        1.20 cm     LV e' lateral:   8.16 cm/s LVOT diam:     2.00 cm     LV E/e' lateral: 8.7 LV SV:         62 LV SV Index:   31 LVOT Area:     3.14 cm  LV Volumes (MOD) LV vol d, MOD A2C: 64.5 ml LV vol d, MOD A4C: 53.5 ml LV vol s, MOD A2C: 22.4 ml LV vol s, MOD A4C: 23.8 ml LV SV MOD A2C:     42.1 ml LV SV MOD A4C:     53.5 ml LV SV MOD BP:      35.2 ml  RIGHT VENTRICLE RV Basal diam:  3.15 cm RV Mid diam:    2.80 cm TAPSE (M-mode): 2.8 cm  LEFT ATRIUM           Index        RIGHT ATRIUM           Index LA diam:      3.80 cm 1.91 cm/m   RA Area:     13.90 cm LA Vol (A2C): 60.2 ml 30.19 ml/m  RA Volume:   33.00 ml  16.55 ml/m LA Vol (A4C): 31.9 ml 16.00 ml/m AORTIC VALVE                    PULMONIC VALVE AV Area (Vmax):    2.39 cm     PV Vmax:       0.61 m/s AV Area (Vmean):   2.33 cm     PV Peak  grad:  1.5 mmHg AV Area (VTI):     2.53 cm AV Vmax:           108.00 cm/s AV Vmean:          74.000 cm/s AV VTI:            0.246 m AV Peak Grad:      4.7 mmHg AV Mean Grad:      3.0 mmHg LVOT Vmax:         82.20 cm/s LVOT Vmean:        54.900 cm/s LVOT VTI:          0.198 m LVOT/AV VTI ratio: 0.80  AORTA Ao Root diam: 3.00 cm Ao Asc diam:  2.70 cm  MITRAL VALVE               TRICUSPID VALVE MV Area (PHT): 4.21 cm    TR Peak grad:   18.3 mmHg MV Area VTI:   2.79 cm    TR Vmax:        214.00 cm/s MV Peak grad:  5.2 mmHg MV Mean grad:  2.0 mmHg    SHUNTS MV Vmax:       1.14 m/s    Systemic VTI:  0.20 m MV Vmean:      71.1 cm/s   Systemic Diam: 2.00 cm MV Decel Time: 180 msec MV E velocity: 70.70 cm/s MV A velocity: 96.80 cm/s MV E/A ratio:  0.73  Eleonore Chiquito MD Electronically signed by Eleonore Chiquito MD Signature Date/Time: 03/29/2022/11:31:48 AM    Final    MONITORS  CARDIAC EVENT MONITOR 06/01/2022             Recent Labs: No results found for requested labs within last 365 days.  Recent Lipid Panel    Component Value Date/Time   CHOL 195 06/11/2009 2347   TRIG 238 (H) 06/11/2009 2347   HDL 60 06/11/2009 2347   CHOLHDL 3.3 Ratio 06/11/2009 2347   VLDL 48 (H) 06/11/2009 2347   LDLCALC 87 06/11/2009 2347        Physical Exam:    VS:  BP 106/74   Pulse 70   Ht '5\' 7"'$  (1.702 m)   Wt 198 lb 3.2 oz (89.9 kg)   SpO2 94%   BMI 31.04 kg/m     Wt Readings from Last 3 Encounters:  09/17/22 198 lb 3.2 oz (89.9 kg)  05/26/22 197 lb 3.2 oz (89.4 kg)  03/08/22 191 lb (86.6 kg)    GEN:  Well nourished, well developed in no acute distress HEENT: Normal NECK: No JVD; No carotid bruits LYMPHATICS: No lymphadenopathy CARDIAC: RRR, no murmurs, rubs, gallops RESPIRATORY:  Clear to auscultation without rales, wheezing or rhonchi  ABDOMEN: Soft, non-tender, non-distended MUSCULOSKELETAL:  +1 edema bilateral; No deformity  SKIN: Warm and dry NEUROLOGIC:  Alert  and oriented x 3 PSYCHIATRIC:  anxious affect   ASSESSMENT:    1. PVC's (premature ventricular contractions)   2. Precordial chest pain   3. LVH (left ventricular hypertrophy)   4. Precordial pain   5. Lower extremity edema    PLAN:    PVCs - no LV dysfunction - has felt worse with 50 mg atenolol; will decrease to 25 mg  - TSH today  Chest pain syndrome - will get Cardiac CT  LVH Leg swelling - reviewed echo, suspect mild LVH, no changes to therapy - will get BNP and BMP - lasix 20 mg q48 but may increase - continue compression stockings  Three- four months with me  Time Spent Directly with Patient:   I have spent a total of 40 minutes with the patient reviewing notes, imaging, EKGs, labs and examining the patient as well as establishing an assessment and plan that was discussed personally with the patient.  > 50% of time was spent in direct patient care and reviewing imaging with patient .       Medication Adjustments/Labs and Tests Ordered: Current medicines are reviewed at length with the patient today.  Concerns regarding medicines are outlined above.  Orders Placed This Encounter  Procedures   CT CORONARY MORPH W/CTA COR W/SCORE W/CA W/CM &/OR WO/CM   TSH   Basic metabolic panel   Pro b natriuretic  peptide (BNP)   Meds ordered this encounter  Medications   atenolol (TENORMIN) 25 MG tablet    Sig: Take 1 tablet (25 mg total) by mouth daily.    Dispense:  180 tablet    Refill:  3   metoprolol tartrate (LOPRESSOR) 100 MG tablet    Sig: Take 2 hours prior to Cardiac CT    Dispense:  1 tablet    Refill:  0    Patient Instructions  Medication Instructions:  Your physician has recommended you make the following change in your medication:  DECREASE: atenolol (Tenormin) to 25 mg by mouth once daily  *If you need a refill on your cardiac medications before your next appointment, please call your pharmacy*   Lab Work: TODAY: BMP, BNP, TSH  If you have  labs (blood work) drawn today and your tests are completely normal, you will receive your results only by: [place] (if you have MyChart) OR A paper copy in the mail If you have any lab test that is abnormal or we need to change your treatment, we will call you to review the results.   Testing/Procedures: Your physician has requested that you have cardiac CT. Cardiac computed tomography (CT) is a painless test that uses an x-ray machine to take clear, detailed pictures of your heart. For further information please visit [email or web address][email or web address]. Please follow instruction sheet as given.     Follow-Up: At [facility], you and your health needs are our priority.  As part of our continuing mission to provide you with exceptional heart care, we have created designated Provider Care Teams.  These Care Teams include your primary Cardiologist (physician) and Advanced Practice Providers (APPs -  Physician Assistants and Nurse Practitioners) who all work together to provide you with the care you need, when you need it.   Your next appointment:   3-4 month(s)  The format for your next appointment:   In Person  Provider:   Rudean Haskell, MD     Other Instructions   Your cardiac CT will be scheduled at one of the below locations:   [facility] [address] [place], [place] [address] [phone]  [place] [address] [place], [place] [address] [phone]  [facility] [place], [place] [address] [phone]  If scheduled at [facility], please arrive at the [facility] and Children's Entrance (Entrance C2) of [facility] 30 minutes prior to test start time. You can use the FREE valet parking offered at entrance C (encouraged to control the heart rate for the test)  Proceed to the [facility] Radiology Department (first floor) to  check-in and test prep.  All radiology patients and guests should use entrance C2 at [facility], accessed from [facility], even though the hospital's physical address listed is [address]    If scheduled at [facility] or [facility], please arrive 15 mins early for check-in and test prep.   Please follow these instructions carefully (unless otherwise directed):  Hold all erectile dysfunction medications at least 3 days (72 hrs) prior to test. (Ie viagra, cialis, sildenafil, tadalafil, etc) We will administer nitroglycerin during this exam.   On the Night Before the Test: Be sure to Drink plenty of water. Do not consume any caffeinated/decaffeinated beverages or chocolate 12 hours prior to your test. Do not take any  antihistamines 12 hours prior to your test.-  - Flonase   On the Day of the Test: Drink plenty of water until 1 hour prior to the test. Do not eat any food 1 hour prior to test. You may take your regular medications prior to the test.  Take metoprolol (Lopressor) 100 mg by mouth two hours prior to test. HOLD Furosemide morning of the test. FEMALES- please wear underwire-free bra if available, avoid dresses & tight clothing        After the Test: Drink plenty of water. After receiving IV contrast, you may experience a mild flushed feeling. This is normal. On occasion, you may experience a mild rash up to 24 hours after the test. This is not dangerous. If this occurs, you can take Benadryl 25 mg and increase your fluid intake. If you experience trouble breathing, this can be serious. If it is severe call 911 IMMEDIATELY. If it is mild, please call our office. If you take any of these medications: Glipizide/Metformin, Avandament, Glucavance, please do not take 48 hours after completing test unless otherwise instructed.  We will call to schedule your test 2-4 weeks out understanding that some  insurance companies will need an authorization prior to the service being performed.   For non-scheduling related questions, please contact the cardiac imaging nurse navigator should you have any questions/concerns: Marchia Bond, Cardiac Imaging Nurse Navigator Gordy Clement, Cardiac Imaging Nurse Navigator [facility] Direct Office Dial: [phone]   For scheduling needs, including cancellations and rescheduling, please call [place], [phone].   Important Information About Sugar         Signed, Werner Lean, MD  09/17/2022 12:07 PM    [place]

## 2022-09-20 ENCOUNTER — Telehealth: Payer: Self-pay

## 2022-09-20 DIAGNOSIS — R600 Localized edema: Secondary | ICD-10-CM

## 2022-09-20 DIAGNOSIS — I493 Ventricular premature depolarization: Secondary | ICD-10-CM

## 2022-09-20 MED ORDER — FUROSEMIDE 20 MG PO TABS: 20.0000 mg | ORAL_TABLET | Freq: Two times a day (BID) | ORAL | 3 refills | Status: DC

## 2022-09-20 NOTE — Telephone Encounter (Signed)
The patient has been notified of the result and verbalized understanding.  All questions (if any) were answered. Natasha Gilding, RN 09/20/2022 10:51 AM   Pt will have f/u labs at [facility] close to home on 10/04/22.  BMP order placed and released for lab collect.

## 2022-09-20 NOTE — Telephone Encounter (Signed)
Pt reports is currently taking furosemide 20 mg BID d/t weight gain.  Advised pt will speak with MD in regards to treatment plan.   MD ok with pt taking 20 mg BID and f/u labs in 2 weeks.  Pt made aware.

## 2022-09-20 NOTE — Telephone Encounter (Signed)
-----   Message from Werner Lean, MD sent at 09/19/2022  2:14 PM EDT ----- BNP elevated Normal thyroid - increase to lasix 20 mg Po daily  BMP in two weeks

## 2022-09-21 LAB — BASIC METABOLIC PANEL
BUN/Creatinine Ratio: 15 (ref 12–28)
BUN: 14 mg/dL (ref 8–27)
CO2: 23 mmol/L (ref 20–29)
Calcium: 9.3 mg/dL (ref 8.7–10.3)
Chloride: 103 mmol/L (ref 96–106)
Creatinine, Ser: 0.92 mg/dL (ref 0.57–1.00)
Glucose: 93 mg/dL (ref 70–99)
Potassium: 4.6 mmol/L (ref 3.5–5.2)
Sodium: 147 mmol/L — ABNORMAL HIGH (ref 134–144)
eGFR: 70 mL/min/{1.73_m2} (ref >59)

## 2022-09-21 LAB — TSH: TSH: 1.31 u[IU]/mL (ref 0.450–4.500)

## 2022-09-21 LAB — PRO B NATRIURETIC PEPTIDE: NT-Pro BNP: 368 pg/mL — ABNORMAL HIGH (ref 0–287)

## 2022-09-28 ENCOUNTER — Ambulatory Visit: Payer: Medicare Other | Admitting: Gastroenterology

## 2022-09-29 ENCOUNTER — Telehealth (HOSPITAL_COMMUNITY): Payer: Self-pay | Admitting: *Deleted

## 2022-09-29 NOTE — Telephone Encounter (Signed)
Patient returning call regarding upcoming cardiac imaging study; pt verbalizes understanding of appt date/time, parking situation and where to check in, medications ordered, and verified current allergies; name and call back number provided for further questions should they arise  Gordy Clement RN Navigator Cardiac Imaging [facility] [phone] office [phone] cell  Patient to take '100mg'$  metoprolol tartrate two hours prior to her cardiac CT scan.  She is aware to arrive at 12pm.

## 2022-09-29 NOTE — Telephone Encounter (Signed)
Attempted to call patient regarding upcoming cardiac CT appointment. °Left message on voicemail with name and callback number ° °Marl Seago RN Navigator Cardiac Imaging °[facility] °[phone] Office °[phone] Cell ° °

## 2022-09-30 ENCOUNTER — Ambulatory Visit (HOSPITAL_COMMUNITY)
Admission: RE | Admit: 2022-09-30 | Discharge: 2022-09-30 | Disposition: A | Discharge status: Home / Self Care | Payer: Medicare Other | Source: Ambulatory Visit | Attending: Internal Medicine | Admitting: Internal Medicine

## 2022-09-30 DIAGNOSIS — R072 Precordial pain: Secondary | ICD-10-CM | POA: Insufficient documentation

## 2022-09-30 MED ORDER — IOHEXOL 350 MG/ML SOLN
95.0000 mL | Freq: Once | INTRAVENOUS | Status: AC | PRN
  Administered 2022-09-30: 95 mL via INTRAVENOUS

## 2022-09-30 MED ORDER — NITROGLYCERIN 0.4 MG SL SUBL
0.8000 mg | SUBLINGUAL_TABLET | Freq: Once | SUBLINGUAL | Status: AC
  Administered 2022-09-30: 0.8 mg via SUBLINGUAL

## 2022-09-30 MED ORDER — SODIUM CHLORIDE 0.9 % IV BOLUS
1000.0000 mL | Freq: Once | INTRAVENOUS | Status: AC
Start: 2022-09-30 — End: 2022-09-30
  Administered 2022-09-30: 1000 mL via INTRAVENOUS

## 2022-09-30 MED ORDER — NITROGLYCERIN 0.4 MG SL SUBL
SUBLINGUAL_TABLET | SUBLINGUAL | Status: AC
  Filled 2022-09-30: qty 2

## 2022-09-30 NOTE — Progress Notes (Signed)
Patient reports feeling weak and lightheaded today post beta blocker. Spoke to Dr. Stanford Breed to notify of symptoms and of blood pressure as well as to verify that full dose of nitroglycerin is to be administered during heart CT. Dr. Stanford Breed ordered 1 liter NS bolus and full dose of NTG for heart CT. Patient was made aware and agrees with POC.

## 2022-10-04 DIAGNOSIS — M1991 Primary osteoarthritis, unspecified site: Secondary | ICD-10-CM | POA: Diagnosis not present

## 2022-10-04 DIAGNOSIS — R600 Localized edema: Secondary | ICD-10-CM | POA: Diagnosis not present

## 2022-10-04 DIAGNOSIS — G894 Chronic pain syndrome: Secondary | ICD-10-CM | POA: Diagnosis not present

## 2022-10-04 DIAGNOSIS — Z0001 Encounter for general adult medical examination with abnormal findings: Secondary | ICD-10-CM | POA: Diagnosis not present

## 2022-10-04 DIAGNOSIS — K219 Gastro-esophageal reflux disease without esophagitis: Secondary | ICD-10-CM | POA: Diagnosis not present

## 2022-10-04 DIAGNOSIS — I872 Venous insufficiency (chronic) (peripheral): Secondary | ICD-10-CM | POA: Diagnosis not present

## 2022-10-05 DIAGNOSIS — E559 Vitamin D deficiency, unspecified: Secondary | ICD-10-CM | POA: Diagnosis not present

## 2022-10-05 DIAGNOSIS — R600 Localized edema: Secondary | ICD-10-CM | POA: Diagnosis not present

## 2022-10-05 DIAGNOSIS — I83892 Varicose veins of left lower extremities with other complications: Secondary | ICD-10-CM | POA: Diagnosis not present

## 2022-10-05 DIAGNOSIS — I87392 Chronic venous hypertension (idiopathic) with other complications of left lower extremity: Secondary | ICD-10-CM | POA: Diagnosis not present

## 2022-10-05 DIAGNOSIS — Z0001 Encounter for general adult medical examination with abnormal findings: Secondary | ICD-10-CM | POA: Diagnosis not present

## 2022-10-05 DIAGNOSIS — E782 Mixed hyperlipidemia: Secondary | ICD-10-CM | POA: Diagnosis not present

## 2022-10-12 DIAGNOSIS — I87391 Chronic venous hypertension (idiopathic) with other complications of right lower extremity: Secondary | ICD-10-CM | POA: Diagnosis not present

## 2022-10-12 DIAGNOSIS — I83891 Varicose veins of right lower extremities with other complications: Secondary | ICD-10-CM | POA: Diagnosis not present

## 2022-10-27 DIAGNOSIS — I83892 Varicose veins of left lower extremities with other complications: Secondary | ICD-10-CM | POA: Diagnosis not present

## 2022-11-26 DIAGNOSIS — E2749 Other adrenocortical insufficiency: Secondary | ICD-10-CM | POA: Diagnosis not present

## 2022-11-29 DIAGNOSIS — M79661 Pain in right lower leg: Secondary | ICD-10-CM | POA: Diagnosis not present

## 2022-11-29 DIAGNOSIS — I83891 Varicose veins of right lower extremities with other complications: Secondary | ICD-10-CM | POA: Diagnosis not present

## 2022-12-08 DIAGNOSIS — I83892 Varicose veins of left lower extremities with other complications: Secondary | ICD-10-CM | POA: Diagnosis not present

## 2022-12-08 DIAGNOSIS — I872 Venous insufficiency (chronic) (peripheral): Secondary | ICD-10-CM | POA: Diagnosis not present

## 2022-12-13 DIAGNOSIS — M1991 Primary osteoarthritis, unspecified site: Secondary | ICD-10-CM | POA: Diagnosis not present

## 2022-12-13 DIAGNOSIS — R600 Localized edema: Secondary | ICD-10-CM | POA: Diagnosis not present

## 2022-12-13 DIAGNOSIS — G894 Chronic pain syndrome: Secondary | ICD-10-CM | POA: Diagnosis not present

## 2022-12-13 DIAGNOSIS — E2749 Other adrenocortical insufficiency: Secondary | ICD-10-CM | POA: Diagnosis not present

## 2022-12-13 DIAGNOSIS — I872 Venous insufficiency (chronic) (peripheral): Secondary | ICD-10-CM | POA: Diagnosis not present

## 2022-12-13 DIAGNOSIS — K219 Gastro-esophageal reflux disease without esophagitis: Secondary | ICD-10-CM | POA: Diagnosis not present

## 2022-12-13 DIAGNOSIS — D518 Other vitamin B12 deficiency anemias: Secondary | ICD-10-CM | POA: Diagnosis not present

## 2022-12-20 DIAGNOSIS — I83891 Varicose veins of right lower extremities with other complications: Secondary | ICD-10-CM | POA: Diagnosis not present

## 2022-12-20 DIAGNOSIS — I83811 Varicose veins of right lower extremities with pain: Secondary | ICD-10-CM | POA: Diagnosis not present

## 2023-01-12 NOTE — Progress Notes (Signed)
Cardiology Office Note:    Date:  01/14/2023   ID:  Joneen Roach Nauman, DOB Sep 09, 1961, MRN [id number]  PCP:  [facility], [place] Providers Cardiologist:  Werner Lean, MD     Referring MD: [facility], MD   CC: Follow up cardiac testing.  History of Present Illness:    Natasha Wright is a 62 y.o. female with recent right knee arthroscopy. 2023: hx of PVCs see [facility] Dr. Joan Mayans in the past. On low dose BB and PRN lasix. Saw Dr. Joan Mayans son has had multiple ablations.  Has been on life support. Had normal Echo and Cardiac CT Felt symptoms with metoprolol then cardizem.  Patient has been fighting a cold that has been lingering.  Has been feeling joint stiffening and has been needing surgery. Had stomach virus late 2023.  Took a while to recover.  Then had COVID-19. Is getting evaluated for Addison's disease and had low cortisol. Right after her infection noted worsening leg swelling.  No chest pain or pressure .  No SOB/DOE and no PND/Orthopnea. Having new skin changes.  New freckles that are different from before.  Started a new cream.  Had hypotension with Cardiac CT scan: if needed in the future would use ivabradine.   Past Medical History:  Diagnosis Date   Anxiety    Arthritis    B12 deficiency    h/o   Family history of heart disease    GERD (gastroesophageal reflux disease)    Hypoglycemia    IBS (irritable bowel syndrome)    Lumbar disc disease    OA (osteoarthritis)    PONV (postoperative nausea and vomiting)    needs scop patch   Rectocele    Shingles    Spinal stenosis in cervical region     Past Surgical History:  Procedure Laterality Date   ABDOMINAL HYSTERECTOMY  1992   ARTHRODESIS FOOT WITH WEIL OSTEOTOMY Left 05/09/2014   Procedure: LEFT FIRST TARSAL METATARSAL  ARTHRODESIS; LEFT SECOND WEIL OSTEOTOMY;  Surgeon: Wylene Simmer, MD;  Location: [facility];  Service: Orthopedics;  Laterality: Left;    BIOPSY  07/11/2018   Procedure: BIOPSY;  Surgeon: Danie Binder, MD;  [facility];  Service: Endoscopy;;  duodenal biopsy , gastric biopsy    BUNIONECTOMY WITH HAMMERTOE RECONSTRUCTION Left 05/09/2014   Procedure: LEFT MODIFIED MCBRIDE BUNIONECTOMY; LEFT SECOND HAMMER TOE CORRECTION;  Surgeon: Wylene Simmer, MD;  Location: [facility];  Service: Orthopedics;  Laterality: Left;   CERVICAL SPINE SURGERY  2009   CHOLECYSTECTOMY  2004   lap choli   COLONOSCOPY  07/08/2003   RMR: Left-sided diverticula, remainder of colonic mucosa and terminal ileum appeared normal.. Minimally friable internal hemorrhoids, otherwise normal rectum   COLONOSCOPY N/A 04/30/2013   Dr. Oneida Alar: mild diverticulosis in descending and sigmoid colon, internal hemorrhoids.    DILATION AND CURETTAGE OF UTERUS     ESOPHAGOGASTRODUODENOSCOPY (EGD) WITH PROPOFOL N/A 07/11/2018   Procedure: ESOPHAGOGASTRODUODENOSCOPY (EGD) WITH PROPOFOL;  Surgeon: Danie Binder, MD;  [facility];  Service: Endoscopy;  Laterality: N/A;  9:30am   NM MYOCAR PERF WALL MOTION  10/2011   bruce myoview - breast attenuation noted in anterior region; EF 65%; no ischemia/infarct/scar; low risk   SAVORY DILATION N/A 07/11/2018   Procedure: SAVORY DILATION;  Surgeon: Danie Binder, MD;  [facility];  Service: Endoscopy;  Laterality: N/A;   SHOULDER SURGERY     TONSILLECTOMY  1980   TUBAL LIGATION      Current Medications: Current Meds  Medication Sig   acetaminophen (TYLENOL) 650 MG CR tablet Take 1,300 mg by mouth 2 (two) times daily as needed for pain.   atenolol (TENORMIN) 25 MG tablet Take 1 tablet (25 mg total) by mouth daily.   citalopram (CELEXA) 20 MG tablet Take 1 tablet (20 mg total) by mouth 2 (two) times daily.   clotrimazole-betamethasone (LOTRISONE) cream Apply topically as needed (rash).   diazepam (VALIUM) 10 MG tablet Take 10 mg by mouth every 8 (eight) hours as needed (muscle  spasms).   dicyclomine (BENTYL) 20 MG tablet Take 20 mg by mouth 4 (four) times daily as needed for spasms.   estradiol (ESTRACE) 0.5 MG tablet Take 0.5 mg by mouth at bedtime.    fluticasone (FLONASE) 50 MCG/ACT nasal spray Place 1 spray into both nostrils 2 (two) times daily.   furosemide (LASIX) 20 MG tablet Take 1 tablet (20 mg total) by mouth 2 (two) times daily.   ipratropium (ATROVENT) 0.06 % nasal spray Place 2 sprays into both nostrils daily.   metoprolol tartrate (LOPRESSOR) 100 MG tablet Take 2 hours prior to Cardiac CT   mirtazapine (REMERON) 15 MG tablet Take 15 mg by mouth at bedtime.   Multiple Vitamin (MULTIVITAMIN WITH MINERALS) TABS tablet Take 1 tablet by mouth daily.   pantoprazole (PROTONIX) 40 MG tablet TAKE 1 TABLET BY MOUTH 30 MINUTES PRIOR TO MEALS TWICE DAILY   rosuvastatin (CRESTOR) 10 MG tablet Take 10 mg by mouth at bedtime.   spironolactone (ALDACTONE) 25 MG tablet Take 0.5 tablets (12.5 mg total) by mouth daily.     Allergies:   Penicillins, Sulfamethoxazole-trimethoprim, Codeine, Fentanyl, Morphine, Adhesive [tape], Influenza vaccines, Latex, and Nsaids   Social History   Socioeconomic History   Marital status: Married    Spouse name: Mikeal Hawthorne   Number of children: 1   Years of education: 14   Highest education level: Not on file  Occupational History   Occupation: Unemployed    Employer: [employer]    Employer: UNEMPLOYED  Tobacco Use   Smoking status: Former    Packs/day: 0.50    Years: 40.00    Total pack years: 20.00    Types: Cigarettes    Quit date: 12/24/2013    Years since quitting: 9.0   Smokeless tobacco: Never   Tobacco comments:    using vape as aid to sustain from smoking  Vaping Use   Vaping Use: Never used  Substance and Sexual Activity   Alcohol use: No   Drug use: No   Sexual activity: Not on file  Other Topics Concern   Not on file  Social History Narrative   One son, Down syndrome,is right handed ,resides with  son and husband   Right handed   Social Determinants of Health   Financial Resource Strain: Not on file  Food Insecurity: Not on file  Transportation Needs: Not on file  Physical Activity: Not on file  Stress: Not on file  Social Connections: Not on file     Family History: The patient's family history includes Asthma in her maternal grandmother; Breast cancer in her maternal grandmother; Diabetes in her maternal grandmother, mother, paternal grandmother, and sister; Down syndrome in her child; Heart disease in her child, paternal grandfather, and paternal grandmother; Hyperlipidemia in her brother, mother, and sister; Hypertension in her maternal grandmother, mother, and sister; Stroke in her maternal grandfather, maternal grandmother, and paternal  grandmother; Ulcerative colitis in her father. There is no history of Colon cancer.  ROS:   Please see the history of present illness.     All other systems reviewed and are negative.  EKGs/Labs/Other Studies Reviewed:    The following studies were reviewed today: 01/14/23: SR rate 90  Cardiac Studies & Procedures     STRESS TESTS  MYOCARDIAL PERFUSION IMAGING 06/05/2020  Narrative  There was no ST segment deviation noted during stress.  Nuclear stress EF: 53%. The left ventricular ejection fraction is normal (55-65%). Visually, the EF is higher than the computer calculated 53%.  This is a low risk study. There is no evidence of ischemia or previous infarction .  The study is normal.   ECHOCARDIOGRAM  ECHOCARDIOGRAM COMPLETE 03/29/2022  Narrative ECHOCARDIOGRAM REPORT    Patient Name:   Natasha Wright Date of Exam: 03/29/2022 Medical Rec #:  [id number]        Height:       67.5 in Accession #:    [id number]       Weight:       191.0 lb Date of Birth:  03/20/1961       BSA:          1.994 m Patient Age:    5 years         BP:           133/82 mmHg Patient Gender: F                HR:           87 bpm. Exam Location:   [facility]  Procedure: 2D Echo, Cardiac Doppler and Color Doppler  Indications:    PVC's  History:        Patient has prior history of Echocardiogram examinations, most recent 04/11/2017. Arrythmias:PVC, Signs/Symptoms:Fatigue, Chest Pain and Dyspnea; Risk Factors:Former Smoker.  Sonographer:    Wenda Low Referring Phys: [place]   1. Left ventricular ejection fraction, by estimation, is 60 to 65%. The left ventricle has normal function. The left ventricle has no regional wall motion abnormalities. Left ventricular diastolic parameters were normal. 2. Right ventricular systolic function is normal. The right ventricular size is normal. There is normal pulmonary artery systolic pressure. The estimated right ventricular systolic pressure is Q000111Q mmHg. 3. The mitral valve is grossly normal. Trivial mitral valve regurgitation. No evidence of mitral stenosis. 4. The aortic valve is tricuspid. Aortic valve regurgitation is not visualized. No aortic stenosis is present. 5. The inferior vena cava is normal in size with greater than 50% respiratory variability, suggesting right atrial pressure of 3 mmHg.  FINDINGS Left Ventricle: Left ventricular ejection fraction, by estimation, is 60 to 65%. The left ventricle has normal function. The left ventricle has no regional wall motion abnormalities. The left ventricular internal cavity size was normal in size. There is no left ventricular hypertrophy. Left ventricular diastolic parameters were normal.  Right Ventricle: The right ventricular size is normal. No increase in right ventricular wall thickness. Right ventricular systolic function is normal. There is normal pulmonary artery systolic pressure. The tricuspid regurgitant velocity is 2.14 m/s, and with an assumed right atrial pressure of 3 mmHg, the estimated right ventricular systolic pressure is Q000111Q mmHg.  Left Atrium: Left atrial size was normal in size.  Right  Atrium: Right atrial size was normal in size.  Pericardium: There is no evidence of pericardial effusion.  Mitral Valve: The mitral valve is grossly  normal. Trivial mitral valve regurgitation. No evidence of mitral valve stenosis. MV peak gradient, 5.2 mmHg. The mean mitral valve gradient is 2.0 mmHg.  Tricuspid Valve: The tricuspid valve is grossly normal. Tricuspid valve regurgitation is trivial. No evidence of tricuspid stenosis.  Aortic Valve: The aortic valve is tricuspid. Aortic valve regurgitation is not visualized. No aortic stenosis is present. Aortic valve mean gradient measures 3.0 mmHg. Aortic valve peak gradient measures 4.7 mmHg. Aortic valve area, by VTI measures 2.53 cm.  Pulmonic Valve: The pulmonic valve was grossly normal. Pulmonic valve regurgitation is not visualized. No evidence of pulmonic stenosis.  Aorta: The aortic root and ascending aorta are structurally normal, with no evidence of dilitation.  Venous: The right lower pulmonary vein is normal. The inferior vena cava is normal in size with greater than 50% respiratory variability, suggesting right atrial pressure of 3 mmHg.  IAS/Shunts: The atrial septum is grossly normal.   LEFT VENTRICLE PLAX 2D LVIDd:         3.90 cm     Diastology LVIDs:         2.90 cm     LV e' medial:    9.36 cm/s LV PW:         1.10 cm     LV E/e' medial:  7.6 LV IVS:        1.20 cm     LV e' lateral:   8.16 cm/s LVOT diam:     2.00 cm     LV E/e' lateral: 8.7 LV SV:         62 LV SV Index:   31 LVOT Area:     3.14 cm  LV Volumes (MOD) LV vol d, MOD A2C: 64.5 ml LV vol d, MOD A4C: 53.5 ml LV vol s, MOD A2C: 22.4 ml LV vol s, MOD A4C: 23.8 ml LV SV MOD A2C:     42.1 ml LV SV MOD A4C:     53.5 ml LV SV MOD BP:      35.2 ml  RIGHT VENTRICLE RV Basal diam:  3.15 cm RV Mid diam:    2.80 cm TAPSE (M-mode): 2.8 cm  LEFT ATRIUM           Index        RIGHT ATRIUM           Index LA diam:      3.80 cm 1.91 cm/m   RA Area:      13.90 cm LA Vol (A2C): 60.2 ml 30.19 ml/m  RA Volume:   33.00 ml  16.55 ml/m LA Vol (A4C): 31.9 ml 16.00 ml/m AORTIC VALVE                    PULMONIC VALVE AV Area (Vmax):    2.39 cm     PV Vmax:       0.61 m/s AV Area (Vmean):   2.33 cm     PV Peak grad:  1.5 mmHg AV Area (VTI):     2.53 cm AV Vmax:           108.00 cm/s AV Vmean:          74.000 cm/s AV VTI:            0.246 m AV Peak Grad:      4.7 mmHg AV Mean Grad:      3.0 mmHg LVOT Vmax:         82.20 cm/s LVOT Vmean:  54.900 cm/s LVOT VTI:          0.198 m LVOT/AV VTI ratio: 0.80  AORTA Ao Root diam: 3.00 cm Ao Asc diam:  2.70 cm  MITRAL VALVE               TRICUSPID VALVE MV Area (PHT): 4.21 cm    TR Peak grad:   18.3 mmHg MV Area VTI:   2.79 cm    TR Vmax:        214.00 cm/s MV Peak grad:  5.2 mmHg MV Mean grad:  2.0 mmHg    SHUNTS MV Vmax:       1.14 m/s    Systemic VTI:  0.20 m MV Vmean:      71.1 cm/s   Systemic Diam: 2.00 cm MV Decel Time: 180 msec MV E velocity: 70.70 cm/s MV A velocity: 96.80 cm/s MV E/A ratio:  0.73  Eleonore Chiquito MD Electronically signed by Eleonore Chiquito MD Signature Date/Time: 03/29/2022/11:31:48 AM    Final    MONITORS  CARDIAC EVENT MONITOR 06/01/2022   CT SCANS  CT CORONARY MORPH W/CTA COR W/SCORE 09/30/2022  Addendum 09/30/2022  2:29 PM ADDENDUM REPORT: 09/30/2022 14:27  EXAM: OVER-READ INTERPRETATION  CT CHEST  The following report is an over-read performed by radiologist Dr. Sabino Dick [facility] on 09/30/2022. This over-read does not include interpretation of cardiac or coronary anatomy or pathology. The coronary CTA interpretation by the cardiologist is attached.  COMPARISON:  None.  FINDINGS: The visualized portions of the extracardiac vascular structures are unremarkable. Visualized mediastinum is unremarkable. Visualized portion of upper abdomen is unremarkable. Visualized pulmonary parenchyma is unremarkable. Visualized  skeleton is unremarkable.  IMPRESSION: No definite abnormality seen involving the visualized extracardiac structures of the chest.   Electronically Signed By: Marijo Conception M.D. On: 09/30/2022 14:27  Narrative CLINICAL DATA:  62 yo female with chest pain  EXAM: Cardiac/Coronary CTA  TECHNIQUE: A non-contrast, gated CT scan was obtained with axial slices of 3 mm through the heart for calcium scoring. Calcium scoring was performed using the Agatston method. A 120 kV prospective, gated, contrast cardiac scan was obtained. Gantry rotation speed was 250 msecs and collimation was 0.6 mm. Two sublingual nitroglycerin tablets (0.8 mg) were given. The 3D data set was reconstructed in 5% intervals of the 35-75% of the R-R cycle. Diastolic phases were analyzed on a dedicated workstation using MPR, MIP, and VRT modes. The patient received 95 cc of contrast.  FINDINGS: Image quality: Excellent.  Noise artifact is: Limited.  Coronary Arteries:  Normal coronary origin.  Right dominance.  Left main: The left main is a large caliber vessel with a normal take off from the left coronary cusp that bifurcates to form a left anterior descending artery and a left circumflex artery. There is no plaque or stenosis.  Left anterior descending artery: The LAD is patent without evidence of plaque or stenosis. The LAD gives off 1 patent diagonal branch.  Left circumflex artery: The LCX is non-dominant and patent with no evidence of plaque or stenosis. The LCX gives off 1 patent obtuse marginal branch.  Right coronary artery: The RCA is dominant with normal take off from the right coronary cusp. There is no evidence of plaque or stenosis. The RCA terminates as a PDA and right posterolateral branch without evidence of plaque or stenosis.  Right Atrium: Right atrial size is within normal limits.  Right Ventricle: The right ventricular cavity is within normal limits.  Left Atrium: Left  atrial size is normal in size with no left atrial appendage filling defect.  Left Ventricle: The ventricular cavity size is within normal limits. There are no stigmata of prior infarction. There is no abnormal filling defect.  Pulmonary arteries: Normal in size without proximal filling defect.  Pulmonary veins: Normal pulmonary venous drainage.  Pericardium: Normal thickness with no significant effusion or calcium present.  Cardiac valves: The aortic valve is trileaflet without significant calcification. The mitral valve is normal structure without significant calcification.  Aorta: Normal caliber with no significant disease.  Extra-cardiac findings: See attached radiology report for non-cardiac structures.  IMPRESSION: 1. Coronary calcium score of 0. This was 0 percentile for age-, sex, and race-matched controls.  2. Normal coronary origin with right dominance.  3. No evidence of CAD.  RECOMMENDATIONS: 1 CAD-RADS 0: No evidence of CAD (0%). Consider non-atherosclerotic causes of chest pain.  Kirk Ruths, MD  Electronically Signed: By: Kirk Ruths M.D. On: 09/30/2022 14:12         Recent Labs: 09/17/2022: BUN 14; Creatinine, Ser 0.92; NT-Pro BNP 368; Potassium 4.6; Sodium 147; TSH 1.310  Recent Lipid Panel    Component Value Date/Time   CHOL 195 06/11/2009 2347   TRIG 238 (H) 06/11/2009 2347   HDL 60 06/11/2009 2347   CHOLHDL 3.3 Ratio 06/11/2009 2347   VLDL 48 (H) 06/11/2009 2347   LDLCALC 87 06/11/2009 2347        Physical Exam:    VS:  BP 130/84   Pulse 90   Ht 5' 7.5" (1.715 m)   Wt 200 lb 9.6 oz (91 kg)   SpO2 95%   BMI 30.95 kg/m     Wt Readings from Last 3 Encounters:  01/14/23 200 lb 9.6 oz (91 kg)  09/17/22 198 lb 3.2 oz (89.9 kg)  05/26/22 197 lb 3.2 oz (89.4 kg)    GEN:  Well nourished, well developed in no acute distress HEENT: Normal NECK: No JVD CARDIAC: RRR, no murmurs, rubs, gallops RESPIRATORY:  Clear to auscultation  without rales, wheezing or rhonchi  ABDOMEN: Soft, non-tender, non-distended MUSCULOSKELETAL: non pitting bilateral edema; No deformity  NEUROLOGIC:  Alert and oriented x 3 PSYCHIATRIC:  anxious affect   ASSESSMENT:    1. Chronic heart failure with preserved ejection fraction ([place])   2. LVH (left ventricular hypertrophy)   3. PVC's (premature ventricular contractions)   4. Medication management   5. Lower extremity edema     PLAN:    Query of HFpEF Chronic lymphedema s/p venous ablations HTN - continue compression stockings - she is worried about the hypotension from the metoprolol for cardiac cT - continue lasix 20 mg PO BID - add aldactone 12.5 mg PO daily; BMP and BNP in 1-2 weeks - patient to check her AMB BP  PVCs - no LV dysfunction, no PVCs today - Given her symptoms of fatigue with atenolol 50 mg; continue 25 mg dose of atenolol  Six months with me or APP       Medication Adjustments/Labs and Tests Ordered: Current medicines are reviewed at length with the patient today.  Concerns regarding medicines are outlined above.  Orders Placed This Encounter  Procedures   Pro b natriuretic peptide (BNP)   Basic metabolic panel   EKG [id number]   Meds ordered this encounter  Medications   spironolactone (ALDACTONE) 25 MG tablet    Sig: Take 0.5 tablets (12.5 mg total) by mouth daily.    Dispense:  45  tablet    Refill:  3    Patient Instructions  Medication Instructions:  Please start Aldactone 12.5 mg once a day. Continue all other medications as listed.   *If you need a refill on your cardiac medications before your next appointment, please call your pharmacy*   Lab Work: Please have blood work in 2 weeks (BMP,pro-BNP)  If you have labs (blood work) drawn today and your tests are completely normal, you will receive your results only by: [place] (if you have MyChart) OR A paper copy in the mail If you have any lab test that is abnormal or we need to  change your treatment, we will call you to review the results.   Follow-Up: At [facility], you and your health needs are our priority.  As part of our continuing mission to provide you with exceptional heart care, we have created designated Provider Care Teams.  These Care Teams include your primary Cardiologist (physician) and Advanced Practice Providers (APPs -  Physician Assistants and Nurse Practitioners) who all work together to provide you with the care you need, when you need it.  We recommend signing up for the patient portal called "MyChart".  Sign up information is provided on this After Visit Summary.  MyChart is used to connect with patients for Virtual Visits (Telemedicine).  Patients are able to view lab/test results, encounter notes, upcoming appointments, etc.  Non-urgent messages can be sent to your provider as well.   To learn more about what you can do with MyChart, go to [email or web address].    Your next appointment:   6 month(s)  Provider:   Dr Gasper Sells        Signed, Werner Lean, MD  01/14/2023 9:34 AM    [place]

## 2023-01-14 ENCOUNTER — Other Ambulatory Visit: Payer: Self-pay | Admitting: *Deleted

## 2023-01-14 ENCOUNTER — Encounter: Payer: Self-pay | Admitting: Internal Medicine

## 2023-01-14 ENCOUNTER — Ambulatory Visit: Payer: Medicare Other | Attending: Internal Medicine | Admitting: Internal Medicine

## 2023-01-14 VITALS — BP 130/84 | HR 90 | Ht 67.5 in | Wt 200.6 lb

## 2023-01-14 DIAGNOSIS — Z79899 Other long term (current) drug therapy: Secondary | ICD-10-CM | POA: Diagnosis not present

## 2023-01-14 DIAGNOSIS — I517 Cardiomegaly: Secondary | ICD-10-CM | POA: Diagnosis not present

## 2023-01-14 DIAGNOSIS — I493 Ventricular premature depolarization: Secondary | ICD-10-CM

## 2023-01-14 DIAGNOSIS — I5032 Chronic diastolic (congestive) heart failure: Secondary | ICD-10-CM | POA: Diagnosis not present

## 2023-01-14 DIAGNOSIS — R600 Localized edema: Secondary | ICD-10-CM | POA: Diagnosis not present

## 2023-01-14 MED ORDER — SPIRONOLACTONE 25 MG PO TABS: 12.5000 mg | ORAL_TABLET | Freq: Every day | ORAL | 3 refills | Status: DC

## 2023-01-14 NOTE — Patient Instructions (Signed)
Medication Instructions:  Please start Aldactone 12.5 mg once a day. Continue all other medications as listed.   *If you need a refill on your cardiac medications before your next appointment, please call your pharmacy*   Lab Work: Please have blood work in 2 weeks (BMP,pro-BNP)  If you have labs (blood work) drawn today and your tests are completely normal, you will receive your results only by: [place] (if you have MyChart) OR A paper copy in the mail If you have any lab test that is abnormal or we need to change your treatment, we will call you to review the results.   Follow-Up: At [facility], you and your health needs are our priority.  As part of our continuing mission to provide you with exceptional heart care, we have created designated Provider Care Teams.  These Care Teams include your primary Cardiologist (physician) and Advanced Practice Providers (APPs -  Physician Assistants and Nurse Practitioners) who all work together to provide you with the care you need, when you need it.  We recommend signing up for the patient portal called "MyChart".  Sign up information is provided on this After Visit Summary.  MyChart is used to connect with patients for Virtual Visits (Telemedicine).  Patients are able to view lab/test results, encounter notes, upcoming appointments, etc.  Non-urgent messages can be sent to your provider as well.   To learn more about what you can do with MyChart, go to [email or web address].    Your next appointment:   6 month(s)  Provider:   Dr Gasper Sells

## 2023-01-18 ENCOUNTER — Other Ambulatory Visit: Payer: Self-pay | Admitting: *Deleted

## 2023-01-18 MED ORDER — SPIRONOLACTONE 25 MG PO TABS: 12.5000 mg | ORAL_TABLET | Freq: Every day | ORAL | 3 refills | Status: DC

## 2023-01-28 DIAGNOSIS — R600 Localized edema: Secondary | ICD-10-CM | POA: Diagnosis not present

## 2023-01-28 DIAGNOSIS — Z79899 Other long term (current) drug therapy: Secondary | ICD-10-CM | POA: Diagnosis not present

## 2023-01-28 DIAGNOSIS — I5032 Chronic diastolic (congestive) heart failure: Secondary | ICD-10-CM | POA: Diagnosis not present

## 2023-01-29 LAB — BASIC METABOLIC PANEL
BUN/Creatinine Ratio: 13 (ref 12–28)
BUN: 12 mg/dL (ref 8–27)
CO2: 21 mmol/L (ref 20–29)
Calcium: 9.2 mg/dL (ref 8.7–10.3)
Chloride: 106 mmol/L (ref 96–106)
Creatinine, Ser: 0.9 mg/dL (ref 0.57–1.00)
Glucose: 100 mg/dL — ABNORMAL HIGH (ref 70–99)
Potassium: 4.7 mmol/L (ref 3.5–5.2)
Sodium: 142 mmol/L (ref 134–144)
eGFR: 72 mL/min/{1.73_m2} (ref >59)

## 2023-01-29 LAB — PRO B NATRIURETIC PEPTIDE: NT-Pro BNP: 149 pg/mL (ref 0–287)

## 2023-02-04 DIAGNOSIS — J0100 Acute maxillary sinusitis, unspecified: Secondary | ICD-10-CM | POA: Diagnosis not present

## 2023-02-04 DIAGNOSIS — K219 Gastro-esophageal reflux disease without esophagitis: Secondary | ICD-10-CM | POA: Diagnosis not present

## 2023-02-04 DIAGNOSIS — M1991 Primary osteoarthritis, unspecified site: Secondary | ICD-10-CM | POA: Diagnosis not present

## 2023-03-09 ENCOUNTER — Ambulatory Visit
Admission: RE | Admit: 2023-03-09 | Discharge: 2023-03-09 | Disposition: A | Discharge status: Home / Self Care | Payer: Medicare Other | Source: Ambulatory Visit | Attending: Nurse Practitioner | Admitting: Nurse Practitioner

## 2023-03-09 ENCOUNTER — Ambulatory Visit (INDEPENDENT_AMBULATORY_CARE_PROVIDER_SITE_OTHER): Payer: Medicare Other

## 2023-03-09 ENCOUNTER — Other Ambulatory Visit: Payer: Self-pay

## 2023-03-09 VITALS — BP 108/74 | HR 84 | Temp 98.1°F | Resp 20

## 2023-03-09 DIAGNOSIS — Z1152 Encounter for screening for COVID-19: Secondary | ICD-10-CM

## 2023-03-09 DIAGNOSIS — J019 Acute sinusitis, unspecified: Secondary | ICD-10-CM

## 2023-03-09 DIAGNOSIS — B9689 Other specified bacterial agents as the cause of diseases classified elsewhere: Secondary | ICD-10-CM | POA: Diagnosis not present

## 2023-03-09 DIAGNOSIS — R059 Cough, unspecified: Secondary | ICD-10-CM | POA: Diagnosis not present

## 2023-03-09 MED ORDER — DOXYCYCLINE HYCLATE 100 MG PO CAPS: 100.0000 mg | ORAL_CAPSULE | Freq: Two times a day (BID) | ORAL | 0 refills | Status: AC

## 2023-03-09 MED ORDER — BENZONATATE 100 MG PO CAPS: 100.0000 mg | ORAL_CAPSULE | Freq: Three times a day (TID) | ORAL | 0 refills | Status: DC | PRN

## 2023-03-09 NOTE — Discharge Instructions (Signed)
As we discussed, I suspect you have a sinus infection.  Take the doxycycline as prescribed to treat it.  Symptoms should improve over the next week to 10 days.  If you develop chest pain or shortness of breath, go to the emergency room.  The chest x-ray today was negative for pneumonia.  We have tested you today for COVID-19.  You will see the results in Mychart and we will call you with positive results.    Please stay home and isolate until you are aware of the results.    Some things that can make you feel better are: - Increased rest - Increasing fluid with water/sugar free electrolytes - Acetaminophen and ibuprofen as needed for fever/pain - Salt water gargling, chloraseptic spray and throat lozenges - OTC guaifenesin (Mucinex) 600 mg twice daily - Saline sinus flushes or a neti pot - Humidifying the air -Tessalon Perles during the day as needed for dry cough

## 2023-03-09 NOTE — ED Provider Notes (Signed)
[facility]    CSN: [id number] Arrival date & time: 03/09/23  1558      History   Chief Complaint Chief Complaint  Patient presents with   Cough    Entered by patient    HPI Natasha Wright is a 62 y.o. female.   Patient presents today for 2-week history of cough, worsened over the past day.  She denies fever, body aches or chills.  No sore throat, ear pain, abdominal pain, vomiting, diarrhea, decreased appetite, or new rash.  She endorses congested cough, shortness of breath chest pain/tightness after coughing, stuffy nose, sinus pressure over the bridge of her nose, headache, loss of taste, and fatigue.  Reports her husband recently tested positive for RSV, COVID-19, and also has pneumonia.  She has been taking Tylenol Cold and sinus for symptoms with some improvement.    Past Medical History:  Diagnosis Date   Anxiety    Arthritis    B12 deficiency    h/o   Family history of heart disease    GERD (gastroesophageal reflux disease)    Hypoglycemia    IBS (irritable bowel syndrome)    Lumbar disc disease    OA (osteoarthritis)    PONV (postoperative nausea and vomiting)    needs scop patch   Rectocele    Shingles    Spinal stenosis in cervical region     Patient Active Problem List   Diagnosis Date Noted   Chronic heart failure with preserved ejection fraction 01/14/2023   PVC's (premature ventricular contractions) 09/17/2022   LVH (left ventricular hypertrophy) 09/17/2022   Dysphagia    Diarrhea    Dyspepsia 06/08/2018   Bilateral pes planus 05/17/2018   Abdominal pain, left lateral 02/10/2018   GERD (gastroesophageal reflux disease) 11/29/2016   Nocturnal hypoxemia 03/01/2014   Abdominal bloating 04/18/2013   UNSPECIFIED PERIPHERAL VASCULAR DISEASE 06/11/2009   CARPAL TUNNEL SYNDROME, BILATERAL 12/20/2008   NECK PAIN, CHRONIC 12/06/2008   VIRAL URI 08/09/2008   HYPOGLYCEMIA 07/15/2008   MACULAR DEGENERATION 07/15/2008   Varicose veins of  right lower extremity with complications 07/15/2008   ALLERGIC RHINITIS 07/15/2008   Constipation 07/15/2008   IBS 07/15/2008   OVERACTIVE BLADDER 07/15/2008   ARTHRITIS 07/15/2008   LOW BACK PAIN 07/15/2008   FIBROMYALGIA 07/15/2008   INSOMNIA 07/15/2008   MIGRAINES, HX OF 07/15/2008    Past Surgical History:  Procedure Laterality Date   ABDOMINAL HYSTERECTOMY  1992   ARTHRODESIS FOOT WITH WEIL OSTEOTOMY Left 05/09/2014   Procedure: LEFT FIRST TARSAL METATARSAL  ARTHRODESIS; LEFT SECOND WEIL OSTEOTOMY;  Surgeon: Toni Arthurs, MD;  Location: [facility];  Service: Orthopedics;  Laterality: Left;   BIOPSY  07/11/2018   Procedure: BIOPSY;  Surgeon: West Bali, MD;  Location: [facility];  Service: Endoscopy;;  duodenal biopsy , gastric biopsy    BUNIONECTOMY WITH HAMMERTOE RECONSTRUCTION Left 05/09/2014   Procedure: LEFT MODIFIED MCBRIDE BUNIONECTOMY; LEFT SECOND HAMMER TOE CORRECTION;  Surgeon: Toni Arthurs, MD;  Location: [facility];  Service: Orthopedics;  Laterality: Left;   CERVICAL SPINE SURGERY  2009   CHOLECYSTECTOMY  2004   lap choli   COLONOSCOPY  07/08/2003   RMR: Left-sided diverticula, remainder of colonic mucosa and terminal ileum appeared normal.. Minimally friable internal hemorrhoids, otherwise normal rectum   COLONOSCOPY N/A 04/30/2013   Dr. Darrick Penna: mild diverticulosis in descending and sigmoid colon, internal hemorrhoids.    DILATION AND CURETTAGE OF UTERUS     ESOPHAGOGASTRODUODENOSCOPY (EGD) WITH  PROPOFOL N/A 07/11/2018   Procedure: ESOPHAGOGASTRODUODENOSCOPY (EGD) WITH PROPOFOL;  Surgeon: West Bali, MD;  Location: [facility];  Service: Endoscopy;  Laterality: N/A;  9:30am   KNEE ARTHROSCOPY Right    NM MYOCAR PERF WALL MOTION  10/2011   bruce myoview - breast attenuation noted in anterior region; EF 65%; no ischemia/infarct/scar; low risk   SAVORY DILATION N/A 07/11/2018   Procedure: SAVORY DILATION;  Surgeon: West Bali, MD;  Location: [facility];  Service: Endoscopy;  Laterality: N/A;   SHOULDER SURGERY     TONSILLECTOMY  1980   TUBAL LIGATION      OB History   No obstetric history on file.      Home Medications    Prior to Admission medications   Medication Sig Start Date End Date Taking? Authorizing Provider  benzonatate (TESSALON) 100 MG capsule Take 1 capsule (100 mg total) by mouth 3 (three) times daily as needed for cough. Do not take with alcohol or while driving or operating heavy machinery.  May cause drowsiness. 03/09/23  Yes Valentino Nose, NP  doxycycline (VIBRAMYCIN) 100 MG capsule Take 1 capsule (100 mg total) by mouth 2 (two) times daily for 7 days. 03/09/23 03/16/23 Yes Valentino Nose, NP  acetaminophen (TYLENOL) 650 MG CR tablet Take 1,300 mg by mouth 2 (two) times daily as needed for pain.    [provider]  atenolol (TENORMIN) 25 MG tablet Take 1 tablet (25 mg total) by mouth daily. 09/17/22   Chandrasekhar, Rondel Jumbo, MD  citalopram (CELEXA) 20 MG tablet Take 1 tablet (20 mg total) by mouth 2 (two) times daily. 08/04/18   Laqueta Linden, MD  clotrimazole-betamethasone (LOTRISONE) cream Apply topically as needed (rash). 12/14/22   [provider]  diazepam (VALIUM) 10 MG tablet Take 10 mg by mouth every 8 (eight) hours as needed (muscle spasms).    [provider]  dicyclomine (BENTYL) 20 MG tablet Take 20 mg by mouth 4 (four) times daily as needed for spasms.    [provider]  estradiol (ESTRACE) 0.5 MG tablet Take 0.5 mg by mouth at bedtime.     [provider]  fluticasone (FLONASE) 50 MCG/ACT nasal spray Place 1 spray into both nostrils 2 (two) times daily.    [provider]  furosemide (LASIX) 20 MG tablet Take 1 tablet (20 mg total) by mouth 2 (two) times daily. 09/20/22   Chandrasekhar, Mahesh A, MD  ipratropium (ATROVENT) 0.06 % nasal spray Place 2 sprays into both nostrils daily.    [provider]  metoprolol tartrate (LOPRESSOR) 100 MG tablet Take 2 hours prior to Cardiac CT 09/17/22   Riley Lam A, MD  mirtazapine (REMERON) 15 MG tablet Take 15 mg by mouth at bedtime. 12/27/22   [provider]  Multiple Vitamin (MULTIVITAMIN WITH MINERALS) TABS tablet Take 1 tablet by mouth daily.    [provider]  pantoprazole (PROTONIX) 40 MG tablet TAKE 1 TABLET BY MOUTH 30 MINUTES PRIOR TO MEALS TWICE DAILY 03/05/19   Tiffany Kocher, PA-C  rosuvastatin (CRESTOR) 10 MG tablet Take 10 mg by mouth at bedtime. 12/02/22   [provider]  spironolactone (ALDACTONE) 25 MG tablet Take 0.5 tablets (12.5 mg total) by mouth daily. 01/18/23   Christell Constant, MD    Family History Family History  Problem Relation Age of Onset   Diabetes Mother    Hypertension Mother    Hyperlipidemia Mother  Ulcerative colitis Father    Breast cancer Maternal Grandmother    Hypertension Maternal Grandmother    Diabetes Maternal Grandmother    Stroke Maternal Grandmother    Asthma Maternal Grandmother    Stroke Maternal Grandfather    Stroke Paternal Grandmother    Diabetes Paternal Grandmother    Heart disease Paternal Grandmother    Heart disease Paternal Grandfather    Hyperlipidemia Brother    Hyperlipidemia Sister    Hypertension Sister    Diabetes Sister    Down syndrome Child    Heart disease Child    Colon cancer Neg Hx     Social History Social History   Tobacco Use   Smoking status: Former    Packs/day: 0.50    Years: 40.00    Additional pack years: 0.00    Total pack years: 20.00    Types: Cigarettes    Quit date: 12/24/2013    Years since quitting: 9.2   Smokeless tobacco: Never   Tobacco comments:    using vape as aid to sustain from smoking  Vaping Use   Vaping Use: Never used  Substance Use Topics   Alcohol use: No   Drug use: No     Allergies   Penicillins, Sulfamethoxazole-trimethoprim, Codeine, Fentanyl, Morphine,  Adhesive [tape], Influenza vaccines, Latex, and Nsaids   Review of Systems Review of Systems Per HPI  Physical Exam Triage Vital Signs ED Triage Vitals  Enc Vitals Group     BP 03/09/23 1610 108/74     Pulse Rate 03/09/23 1610 84     Resp 03/09/23 1610 20     Temp 03/09/23 1610 98.1 F (36.7 C)     Temp Source 03/09/23 1610 Oral     SpO2 03/09/23 1610 93 %     Weight --      Height --      Head Circumference --      Peak Flow --      Pain Score 03/09/23 1605 0     Pain Loc --      Pain Edu? --      Excl. in GC? --    No data found.  Updated Vital Signs BP 108/74 (BP Location: Right Arm)   Pulse 84   Temp 98.1 F (36.7 C) (Oral)   Resp 20   SpO2 93%   Visual Acuity Right Eye Distance:   Left Eye Distance:   Bilateral Distance:    Right Eye Near:   Left Eye Near:    Bilateral Near:     Physical Exam Vitals and nursing note reviewed.  Constitutional:      General: She is not in acute distress.    Appearance: Normal appearance. She is not ill-appearing or toxic-appearing.  HENT:     Head: Normocephalic and atraumatic.     Right Ear: Tympanic membrane, ear canal and external ear normal.     Left Ear: Tympanic membrane, ear canal and external ear normal.     Nose: Congestion present. No rhinorrhea.     Right Sinus: Maxillary sinus tenderness present. No frontal sinus tenderness.     Left Sinus: Maxillary sinus tenderness present. No frontal sinus tenderness.     Mouth/Throat:     Mouth: Mucous membranes are moist.     Pharynx: Oropharynx is clear. Posterior oropharyngeal erythema present. No oropharyngeal exudate.  Eyes:     General: No scleral icterus.    Extraocular Movements: Extraocular movements intact.  Cardiovascular:  Rate and Rhythm: Normal rate and regular rhythm.  Pulmonary:     Effort: Pulmonary effort is normal. No respiratory distress.     Breath sounds: Normal breath sounds. No wheezing, rhonchi or rales.  Musculoskeletal:     Cervical  back: Normal range of motion and neck supple.  Lymphadenopathy:     Cervical: No cervical adenopathy.  Skin:    General: Skin is warm and dry.     Capillary Refill: Capillary refill takes less than 2 seconds.     Coloration: Skin is not jaundiced or pale.     Findings: No erythema or rash.  Neurological:     Mental Status: She is alert and oriented to person, place, and time.  Psychiatric:        Behavior: Behavior is cooperative.      UC Treatments / Results  Labs (all labs ordered are listed, but only abnormal results are displayed) Labs Reviewed  SARS CORONAVIRUS 2 (TAT 6-24 HRS)    EKG   Radiology DG Chest 2 View  Result Date: 03/09/2023 CLINICAL DATA:  Cough EXAM: CHEST - 2 VIEW COMPARISON:  03/30/2017 FINDINGS: Cardiac and mediastinal contours are within normal limits. No focal pulmonary opacity. No pleural effusion or pneumothorax. No acute osseous abnormality. IMPRESSION: No acute cardiopulmonary process. Electronically Signed   By: Wiliam Ke M.D.   On: 03/09/2023 16:24    Procedures Procedures (including critical care time)  Medications Ordered in UC Medications - No data to display  Initial Impression / Assessment and Plan / UC Course  I have reviewed the triage vital signs and the nursing notes.  Pertinent labs & imaging results that were available during my care of the patient were reviewed by me and considered in my medical decision making (see chart for details).   Patient is well-appearing, normotensive, afebrile, not tachycardic, not tachypneic, oxygenating well on room air.    1. Acute bacterial sinusitis 2. Encounter for screening for COVID-19 Given length of cough and sinus pressure, suspect bacterial etiology Will treat with doxycycline twice daily for 7 days Supportive care discussed with patient Start Tessalon Perles as needed for cough COVID-19 testing obtained given known exposure to COVID-19, although I discussed with patient that  symptoms are consistent with bacterial sinusitis given length of symptoms ER and return precautions discussed with patient  The patient was given the opportunity to ask questions.  All questions answered to their satisfaction.  The patient is in agreement to this plan.     Final Clinical Impressions(s) / UC Diagnoses   Final diagnoses:  Acute bacterial sinusitis  Encounter for screening for COVID-19     Discharge Instructions      As we discussed, I suspect you have a sinus infection.  Take the doxycycline as prescribed to treat it.  Symptoms should improve over the next week to 10 days.  If you develop chest pain or shortness of breath, go to the emergency room.  The chest x-ray today was negative for pneumonia.  We have tested you today for COVID-19.  You will see the results in Mychart and we will call you with positive results.    Please stay home and isolate until you are aware of the results.    Some things that can make you feel better are: - Increased rest - Increasing fluid with water/sugar free electrolytes - Acetaminophen and ibuprofen as needed for fever/pain - Salt water gargling, chloraseptic spray and throat lozenges - OTC guaifenesin (Mucinex) 600 mg twice  daily - Saline sinus flushes or a neti pot - Humidifying the air -Tessalon Perles during the day as needed for dry cough     ED Prescriptions     Medication Sig Dispense Auth. Provider   doxycycline (VIBRAMYCIN) 100 MG capsule Take 1 capsule (100 mg total) by mouth 2 (two) times daily for 7 days. 14 capsule Cathlean Marseilles A, NP   benzonatate (TESSALON) 100 MG capsule Take 1 capsule (100 mg total) by mouth 3 (three) times daily as needed for cough. Do not take with alcohol or while driving or operating heavy machinery.  May cause drowsiness. 21 capsule Valentino Nose, NP      PDMP not reviewed this encounter.   Valentino Nose, NP 03/09/23 1650

## 2023-03-09 NOTE — ED Triage Notes (Signed)
Pt reports cough for last several days. History of similar since christmas. Reports recurrent use of prednisone and z-pacs. Recent exposure to covid/rsv/pneumonia.

## 2023-03-10 LAB — SARS CORONAVIRUS 2 (TAT 6-24 HRS): SARS Coronavirus 2: NEGATIVE

## 2023-03-14 DIAGNOSIS — J0100 Acute maxillary sinusitis, unspecified: Secondary | ICD-10-CM | POA: Diagnosis not present

## 2023-03-14 DIAGNOSIS — M1990 Unspecified osteoarthritis, unspecified site: Secondary | ICD-10-CM | POA: Diagnosis not present

## 2023-03-15 ENCOUNTER — Other Ambulatory Visit: Payer: Self-pay | Admitting: Internal Medicine

## 2023-03-15 DIAGNOSIS — Z1231 Encounter for screening mammogram for malignant neoplasm of breast: Secondary | ICD-10-CM

## 2023-04-05 ENCOUNTER — Encounter: Payer: Self-pay | Admitting: *Deleted

## 2023-04-06 ENCOUNTER — Ambulatory Visit
Admission: RE | Admit: 2023-04-06 | Discharge: 2023-04-06 | Disposition: A | Discharge status: Home / Self Care | Payer: Medicare Other | Source: Ambulatory Visit

## 2023-04-06 DIAGNOSIS — Z1231 Encounter for screening mammogram for malignant neoplasm of breast: Secondary | ICD-10-CM | POA: Diagnosis not present

## 2023-04-14 DIAGNOSIS — J310 Chronic rhinitis: Secondary | ICD-10-CM | POA: Diagnosis not present

## 2023-04-14 DIAGNOSIS — Z9889 Other specified postprocedural states: Secondary | ICD-10-CM | POA: Diagnosis not present

## 2023-04-14 DIAGNOSIS — R0981 Nasal congestion: Secondary | ICD-10-CM | POA: Diagnosis not present

## 2023-05-01 ENCOUNTER — Other Ambulatory Visit: Payer: Self-pay | Admitting: Cardiovascular Disease

## 2023-05-10 ENCOUNTER — Other Ambulatory Visit: Payer: Self-pay | Admitting: Cardiovascular Disease

## 2023-05-11 ENCOUNTER — Other Ambulatory Visit: Payer: Self-pay

## 2023-05-11 MED ORDER — SPIRONOLACTONE 25 MG PO TABS: 12.5000 mg | ORAL_TABLET | Freq: Every day | ORAL | 2 refills | Status: DC

## 2023-05-12 MED ORDER — ATENOLOL 25 MG PO TABS: 25.0000 mg | ORAL_TABLET | Freq: Every day | ORAL | 2 refills | Status: DC

## 2023-05-16 DIAGNOSIS — Z9889 Other specified postprocedural states: Secondary | ICD-10-CM | POA: Diagnosis not present

## 2023-05-16 DIAGNOSIS — J310 Chronic rhinitis: Secondary | ICD-10-CM | POA: Diagnosis not present

## 2023-05-16 DIAGNOSIS — R0981 Nasal congestion: Secondary | ICD-10-CM | POA: Diagnosis not present

## 2023-06-02 ENCOUNTER — Other Ambulatory Visit: Payer: Self-pay | Admitting: Cardiovascular Disease

## 2023-06-02 DIAGNOSIS — I890 Lymphedema, not elsewhere classified: Secondary | ICD-10-CM | POA: Diagnosis not present

## 2023-06-03 DIAGNOSIS — I872 Venous insufficiency (chronic) (peripheral): Secondary | ICD-10-CM | POA: Diagnosis not present

## 2023-06-03 DIAGNOSIS — I87391 Chronic venous hypertension (idiopathic) with other complications of right lower extremity: Secondary | ICD-10-CM | POA: Diagnosis not present

## 2023-06-08 ENCOUNTER — Ambulatory Visit: Payer: Medicare Other | Admitting: Gastroenterology

## 2023-06-08 ENCOUNTER — Encounter: Payer: Self-pay | Admitting: Gastroenterology

## 2023-06-08 ENCOUNTER — Other Ambulatory Visit: Payer: Self-pay | Admitting: Gastroenterology

## 2023-06-08 VITALS — BP 106/72 | HR 93 | Temp 97.9°F | Ht 68.0 in | Wt 204.0 lb

## 2023-06-08 DIAGNOSIS — K219 Gastro-esophageal reflux disease without esophagitis: Secondary | ICD-10-CM | POA: Diagnosis not present

## 2023-06-08 DIAGNOSIS — R109 Unspecified abdominal pain: Secondary | ICD-10-CM

## 2023-06-08 DIAGNOSIS — K580 Irritable bowel syndrome with diarrhea: Secondary | ICD-10-CM

## 2023-06-08 DIAGNOSIS — R140 Abdominal distension (gaseous): Secondary | ICD-10-CM

## 2023-06-08 MED ORDER — COLESTIPOL HCL 1 G PO TABS: ORAL_TABLET | ORAL | 1 refills | Status: DC

## 2023-06-08 NOTE — Progress Notes (Signed)
GI Office Note    Referring Provider: Elfredia Nevins, MD Primary Care Physician:  Elfredia Nevins, MD  Primary Gastroenterologist: Hennie Duos. Marletta Lor, DO   Chief Complaint   Chief Complaint  Patient presents with   Follow-up    Follow up on abd pain, diarrhea, bloating and gas.      History of Present Illness   Natasha Wright is a 62 y.o. female presenting today for further evaluation of abdominal pain, diarrhea, bloating, and gas. She has history IBS. Last seen in 2020.   Having more issues with her stomach. Fighting a lot physically. Issues with lymphedema, pending nasal surgery, reports couple of episodes of diverticulitis more recently. Between urgency of stools and taking diuretics, she finds it hard to leave her house. Takes care of her father who is in his 56s and her son with Down's syndrome. She developed PTSD several years back after her son was very sick/life support for over 40 days in the hospital.   Takes Dicyclomine 20 mg 4 times daily as needed but can't take regularly as it causes her to be sleepy. Pantoprazole 40 mg twice daily. Complains of postprandial loose stools within one hour of eating.  Stools are muddy red or yellow. Smell like chemicals. Stomach noisy. This morning stools pretty close to black. Denies Pepto. Lives off of gas-x. Feels like she can't pass gas. Sometimes drinks carbonated beverage to help her burp. Trying to eat better, but fruits and veggies make diarrhea worse. Stevia worsened diarrhea. On good days, she has 3 stools per day, watery consistency. No nocturnal stools. Sometimes cramping before stools can be significant and after stool still has pain. No solid stool since 10/2022. Had covid around that time. Some breakthrough heartburn at times. No dysphagia.   CT abdomen pelvis with contrast January 2023: Impression: No acute abnormality in the abdomen or pelvis.  Specifically no evidence of acute bowel inflammation or  diverticulitis.  EGD 06/2018: -esophagus normal s/p dilation, patient responded to dilation -mild gastritis, no h.pylori -duodenal bx negative.  Colonoscopy 04/2013: -mild diverticulosis -small internal hemorrhoids  Medications   Current Outpatient Medications  Medication Sig Dispense Refill   acetaminophen (TYLENOL) 650 MG CR tablet Take 1,300 mg by mouth 2 (two) times daily as needed for pain.     atenolol (TENORMIN) 25 MG tablet Take 1 tablet (25 mg total) by mouth daily. 90 tablet 2   citalopram (CELEXA) 20 MG tablet Take 1 tablet (20 mg total) by mouth 2 (two) times daily. 180 tablet 3   clotrimazole-betamethasone (LOTRISONE) cream Apply topically as needed (rash).     dicyclomine (BENTYL) 20 MG tablet Take 20 mg by mouth 4 (four) times daily as needed for spasms.     estradiol (ESTRACE) 0.5 MG tablet Take 0.5 mg by mouth at bedtime.      fluticasone (FLONASE) 50 MCG/ACT nasal spray Place 1 spray into both nostrils 2 (two) times daily.     ipratropium (ATROVENT) 0.06 % nasal spray Place 2 sprays into both nostrils daily.     metoprolol tartrate (LOPRESSOR) 100 MG tablet Take 2 hours prior to Cardiac CT 1 tablet 0   mirtazapine (REMERON) 15 MG tablet Take 15 mg by mouth at bedtime.     Multiple Vitamin (MULTIVITAMIN WITH MINERALS) TABS tablet Take 1 tablet by mouth daily.     pantoprazole (PROTONIX) 40 MG tablet TAKE 1 TABLET BY MOUTH 30 MINUTES PRIOR TO MEALS TWICE DAILY 60 tablet 11   rosuvastatin (  CRESTOR) 10 MG tablet Take 10 mg by mouth at bedtime.     spironolactone (ALDACTONE) 25 MG tablet Take 0.5 tablets (12.5 mg total) by mouth daily. 45 tablet 2   benzonatate (TESSALON) 100 MG capsule Take 1 capsule (100 mg total) by mouth 3 (three) times daily as needed for cough. Do not take with alcohol or while driving or operating heavy machinery.  May cause drowsiness. (Patient not taking: Reported on 06/08/2023) 21 capsule 0   No current facility-administered medications for this  visit.    Allergies   Allergies as of 06/08/2023 - Review Complete 06/08/2023  Allergen Reaction Noted   Penicillins Shortness Of Breath and Rash    Sulfamethoxazole-trimethoprim Shortness Of Breath and Rash    Codeine Nausea Only and Other (See Comments)    Fentanyl Nausea Only and Other (See Comments)    Morphine Nausea Only and Other (See Comments)    Adhesive [tape] Other (See Comments) 06/29/2018   Influenza vaccines Rash 03/06/2014   Latex Rash 06/29/2018   Nsaids Nausea Only 04/23/2013    Past Medical History   Past Medical History:  Diagnosis Date   Anxiety    Arthritis    B12 deficiency    h/o   Family history of heart disease    GERD (gastroesophageal reflux disease)    Hypoglycemia    IBS (irritable bowel syndrome)    Lumbar disc disease    OA (osteoarthritis)    PONV (postoperative nausea and vomiting)    needs scop patch   Rectocele    Shingles    Spinal stenosis in cervical region     Past Surgical History   Past Surgical History:  Procedure Laterality Date   ABDOMINAL HYSTERECTOMY  1992   ARTHRODESIS FOOT WITH WEIL OSTEOTOMY Left 05/09/2014   Procedure: LEFT FIRST TARSAL METATARSAL  ARTHRODESIS; LEFT SECOND WEIL OSTEOTOMY;  Surgeon: Toni Arthurs, MD;  Location: [facility];  Service: Orthopedics;  Laterality: Left;   BIOPSY  07/11/2018   Procedure: BIOPSY;  Surgeon: West Bali, MD;  Location: [facility];  Service: Endoscopy;;  duodenal biopsy , gastric biopsy    BUNIONECTOMY WITH HAMMERTOE RECONSTRUCTION Left 05/09/2014   Procedure: LEFT MODIFIED MCBRIDE BUNIONECTOMY; LEFT SECOND HAMMER TOE CORRECTION;  Surgeon: Toni Arthurs, MD;  Location:  [facility];  Service: Orthopedics;  Laterality: Left;   CERVICAL SPINE SURGERY  2009   CHOLECYSTECTOMY  2004   lap choli   COLONOSCOPY  07/08/2003   RMR: Left-sided diverticula, remainder of colonic mucosa and terminal ileum appeared normal.. Minimally friable internal  hemorrhoids, otherwise normal rectum   COLONOSCOPY N/A 04/30/2013   Dr. Darrick Penna: mild diverticulosis in descending and sigmoid colon, internal hemorrhoids.    DILATION AND CURETTAGE OF UTERUS     ESOPHAGOGASTRODUODENOSCOPY (EGD) WITH PROPOFOL N/A 07/11/2018   Procedure: ESOPHAGOGASTRODUODENOSCOPY (EGD) WITH PROPOFOL;  Surgeon: West Bali, MD;  Location: [facility];  Service: Endoscopy;  Laterality: N/A;  9:30am   KNEE ARTHROSCOPY Right    NASAL SINUS SURGERY     NM MYOCAR PERF WALL MOTION  10/2011   bruce myoview - breast attenuation noted in anterior region; EF 65%; no ischemia/infarct/scar; low risk   right ankle surgery     SAVORY DILATION N/A 07/11/2018   Procedure: SAVORY DILATION;  Surgeon: West Bali, MD;  Location: [facility];  Service: Endoscopy;  Laterality: N/A;   SHOULDER SURGERY     TONSILLECTOMY  1980   TUBAL LIGATION  Past Family History   Family History  Problem Relation Age of Onset   Diabetes Mother    Hypertension Mother    Hyperlipidemia Mother    Ulcerative colitis Father    Breast cancer Maternal Grandmother    Hypertension Maternal Grandmother    Diabetes Maternal Grandmother    Stroke Maternal Grandmother    Asthma Maternal Grandmother    Stroke Maternal Grandfather    Stroke Paternal Grandmother    Diabetes Paternal Grandmother    Heart disease Paternal Grandmother    Heart disease Paternal Grandfather    Hyperlipidemia Brother    Hyperlipidemia Sister    Hypertension Sister    Diabetes Sister    Down syndrome Child    Heart disease Child    Colon cancer Neg Hx     Past Social History   Social History   Socioeconomic History   Marital status: Married    Spouse name: Remi Deter   Number of children: 1   Years of education: 14   Highest education level: Not on file  Occupational History   Occupation: Unemployed    Employer: [employer]    Employer: UNEMPLOYED  Tobacco Use   Smoking status: Former    Current  packs/day: 0.00    Average packs/day: 0.5 packs/day for 40.0 years (20.0 ttl pk-yrs)    Types: Cigarettes    Start date: 12/24/1973    Quit date: 12/24/2013    Years since quitting: 9.4   Smokeless tobacco: Never   Tobacco comments:    using vape as aid to sustain from smoking  Vaping Use   Vaping status: Never Used  Substance and Sexual Activity   Alcohol use: No   Drug use: No   Sexual activity: Not on file  Other Topics Concern   Not on file  Social History Narrative   One son, Down syndrome,is right handed ,resides with son and husband   Right handed   Social Determinants of Health   Financial Resource Strain: Not on file  Food Insecurity: Not on file  Transportation Needs: Not on file  Physical Activity: Not on file  Stress: Not on file  Social Connections: Not on file  Intimate Partner Violence: Not on file    Review of Systems   General: Negative for anorexia, weight loss, fever, chills, fatigue, weakness. Eyes: Negative for vision changes.  ENT: Negative for hoarseness, difficulty swallowing, +nasal congestion. CV: Negative for chest pain, angina, palpitations, dyspnea on exertion, +peripheral edema.  Respiratory: Negative for dyspnea at rest, dyspnea on exertion, cough, sputum, wheezing.  GI: See history of present illness. GU:  Negative for dysuria, hematuria, urinary incontinence, urinary frequency, nocturnal urination.  MS: Negative for joint pain, low back pain.  Derm: Negative for rash or itching.  Neuro: Negative for weakness, abnormal sensation, seizure, frequent headaches, memory loss,  confusion.  Psych: Negative for anxiety, depression, suicidal ideation, hallucinations. See hpi Endo: Negative for unusual weight change.  Heme: Negative for bruising or bleeding. Allergy: Negative for rash or hives.  Physical Exam   BP 106/72   Pulse 93   Temp 97.9 F (36.6 C)   Ht 5\' 8"  (1.727 m)   Wt 204 lb (92.5 kg)   BMI 31.02 kg/m    General:  Well-nourished, well-developed in no acute distress.  Head: Normocephalic, atraumatic.   Eyes: Conjunctiva pink, no icterus. Mouth: Oropharyngeal mucosa moist and pink  Neck: Supple without thyromegaly, masses, or lymphadenopathy.  Lungs: Clear to auscultation bilaterally.  Heart:  Regular rate and rhythm, no murmurs rubs or gallops.  Abdomen: Bowel sounds are normal, nontender, nondistended, no hepatosplenomegaly or masses,  no abdominal bruits or hernia, no rebound or guarding.   Rectal: not performed Extremities: No lower extremity edema. No clubbing or deformities.  Neuro: Alert and oriented x 4 , grossly normal neurologically.  Skin: Warm and dry, no rash or jaundice.   Psych: Alert and cooperative, normal mood and affect.  Labs   Lab Results  Component Value Date   NA 142 01/28/2023   CL 106 01/28/2023   K 4.7 01/28/2023   CO2 21 01/28/2023   BUN 12 01/28/2023   CREATININE 0.90 01/28/2023   EGFR 72 01/28/2023   CALCIUM 9.2 01/28/2023   ALBUMIN 4.9 02/10/2018   GLUCOSE 100 (H) 01/28/2023      Imaging Studies   No results found.  Assessment   *Diarrhea *Bloating *Abdominal cramping *GERD   Reflux symptoms with breakthrough symptoms at times. Postprandial diarrhea, bloating, cramping possibly flare of IBS. Doubt infectious etiology but we did discuss possible stool studies to rule out C.Diff but she declined stating her stools do not have the typical smell. Remote celiac serologies were negative. She is s/p cholecystectomy so bile acid diarrhea could be playing a role. Reported one black stool this morning. Offered blood work, she declined. Will monitor. She is overdue for colonoscopy. At this time offered colonoscopy for screening and to evaluate her diarrhea.    PLAN   Start colestid 2 g once daily, separate from other medications by two hours minimum. Continue dicyclomine as needed for cramping, diarrhea. She is limited due to drowsiness.  Continue pantoprazole  once to twice daily for reflux. If ongoing breakthrough symptoms, we can consider changing in the future.  Monitor for further black stools, recommend labs if occur.  Colonoscopy to be scheduled. ASA 3.  I have discussed the risks, alternatives, benefits with regards to but not limited to the risk of reaction to medication, bleeding, infection, perforation and the patient is agreeable to proceed. Written consent to be obtained.    Leanna Battles. Melvyn Neth, MHS, PA-C [facility]

## 2023-06-08 NOTE — H&P (View-Only) (Signed)
 GI Office Note    Referring Provider: Elfredia Nevins, MD Primary Care Physician:  Elfredia Nevins, MD  Primary Gastroenterologist: Hennie Duos. Marletta Lor, DO   Chief Complaint   Chief Complaint  Patient presents with   Follow-up    Follow up on abd pain, diarrhea, bloating and gas.      History of Present Illness   Natasha Wright is a 62 y.o. female presenting today for further evaluation of abdominal pain, diarrhea, bloating, and gas. She has history IBS. Last seen in 2020.   Having more issues with her stomach. Fighting a lot physically. Issues with lymphedema, pending nasal surgery, reports couple of episodes of diverticulitis more recently. Between urgency of stools and taking diuretics, she finds it hard to leave her house. Takes care of her father who is in his 56s and her son with Down's syndrome. She developed PTSD several years back after her son was very sick/life support for over 40 days in the hospital.   Takes Dicyclomine 20 mg 4 times daily as needed but can't take regularly as it causes her to be sleepy. Pantoprazole 40 mg twice daily. Complains of postprandial loose stools within one hour of eating.  Stools are muddy red or yellow. Smell like chemicals. Stomach noisy. This morning stools pretty close to black. Denies Pepto. Lives off of gas-x. Feels like she can't pass gas. Sometimes drinks carbonated beverage to help her burp. Trying to eat better, but fruits and veggies make diarrhea worse. Stevia worsened diarrhea. On good days, she has 3 stools per day, watery consistency. No nocturnal stools. Sometimes cramping before stools can be significant and after stool still has pain. No solid stool since 10/2022. Had covid around that time. Some breakthrough heartburn at times. No dysphagia.   CT abdomen pelvis with contrast January 2023: Impression: No acute abnormality in the abdomen or pelvis.  Specifically no evidence of acute bowel inflammation or  diverticulitis.  EGD 06/2018: -esophagus normal s/p dilation, patient responded to dilation -mild gastritis, no h.pylori -duodenal bx negative.  Colonoscopy 04/2013: -mild diverticulosis -small internal hemorrhoids  Medications   Current Outpatient Medications  Medication Sig Dispense Refill   acetaminophen (TYLENOL) 650 MG CR tablet Take 1,300 mg by mouth 2 (two) times daily as needed for pain.     atenolol (TENORMIN) 25 MG tablet Take 1 tablet (25 mg total) by mouth daily. 90 tablet 2   citalopram (CELEXA) 20 MG tablet Take 1 tablet (20 mg total) by mouth 2 (two) times daily. 180 tablet 3   clotrimazole-betamethasone (LOTRISONE) cream Apply topically as needed (rash).     dicyclomine (BENTYL) 20 MG tablet Take 20 mg by mouth 4 (four) times daily as needed for spasms.     estradiol (ESTRACE) 0.5 MG tablet Take 0.5 mg by mouth at bedtime.      fluticasone (FLONASE) 50 MCG/ACT nasal spray Place 1 spray into both nostrils 2 (two) times daily.     ipratropium (ATROVENT) 0.06 % nasal spray Place 2 sprays into both nostrils daily.     metoprolol tartrate (LOPRESSOR) 100 MG tablet Take 2 hours prior to Cardiac CT 1 tablet 0   mirtazapine (REMERON) 15 MG tablet Take 15 mg by mouth at bedtime.     Multiple Vitamin (MULTIVITAMIN WITH MINERALS) TABS tablet Take 1 tablet by mouth daily.     pantoprazole (PROTONIX) 40 MG tablet TAKE 1 TABLET BY MOUTH 30 MINUTES PRIOR TO MEALS TWICE DAILY 60 tablet 11   rosuvastatin (  CRESTOR) 10 MG tablet Take 10 mg by mouth at bedtime.     spironolactone (ALDACTONE) 25 MG tablet Take 0.5 tablets (12.5 mg total) by mouth daily. 45 tablet 2   benzonatate (TESSALON) 100 MG capsule Take 1 capsule (100 mg total) by mouth 3 (three) times daily as needed for cough. Do not take with alcohol or while driving or operating heavy machinery.  May cause drowsiness. (Patient not taking: Reported on 06/08/2023) 21 capsule 0   No current facility-administered medications for this  visit.    Allergies   Allergies as of 06/08/2023 - Review Complete 06/08/2023  Allergen Reaction Noted   Penicillins Shortness Of Breath and Rash    Sulfamethoxazole-trimethoprim Shortness Of Breath and Rash    Codeine Nausea Only and Other (See Comments)    Fentanyl Nausea Only and Other (See Comments)    Morphine Nausea Only and Other (See Comments)    Adhesive [tape] Other (See Comments) 06/29/2018   Influenza vaccines Rash 03/06/2014   Latex Rash 06/29/2018   Nsaids Nausea Only 04/23/2013    Past Medical History   Past Medical History:  Diagnosis Date   Anxiety    Arthritis    B12 deficiency    h/o   Family history of heart disease    GERD (gastroesophageal reflux disease)    Hypoglycemia    IBS (irritable bowel syndrome)    Lumbar disc disease    OA (osteoarthritis)    PONV (postoperative nausea and vomiting)    needs scop patch   Rectocele    Shingles    Spinal stenosis in cervical region     Past Surgical History   Past Surgical History:  Procedure Laterality Date   ABDOMINAL HYSTERECTOMY  1992   ARTHRODESIS FOOT WITH WEIL OSTEOTOMY Left 05/09/2014   Procedure: LEFT FIRST TARSAL METATARSAL  ARTHRODESIS; LEFT SECOND WEIL OSTEOTOMY;  Surgeon: Toni Arthurs, MD;  Location: [facility];  Service: Orthopedics;  Laterality: Left;   BIOPSY  07/11/2018   Procedure: BIOPSY;  Surgeon: West Bali, MD;  Location: [facility];  Service: Endoscopy;;  duodenal biopsy , gastric biopsy    BUNIONECTOMY WITH HAMMERTOE RECONSTRUCTION Left 05/09/2014   Procedure: LEFT MODIFIED MCBRIDE BUNIONECTOMY; LEFT SECOND HAMMER TOE CORRECTION;  Surgeon: Toni Arthurs, MD;  [facility];  Service: Orthopedics;  Laterality: Left;   CERVICAL SPINE SURGERY  2009   CHOLECYSTECTOMY  2004   lap choli   COLONOSCOPY  07/08/2003   RMR: Left-sided diverticula, remainder of colonic mucosa and terminal ileum appeared normal.. Minimally friable internal  hemorrhoids, otherwise normal rectum   COLONOSCOPY N/A 04/30/2013   Dr. Darrick Penna: mild diverticulosis in descending and sigmoid colon, internal hemorrhoids.    DILATION AND CURETTAGE OF UTERUS     ESOPHAGOGASTRODUODENOSCOPY (EGD) WITH PROPOFOL N/A 07/11/2018   Procedure: ESOPHAGOGASTRODUODENOSCOPY (EGD) WITH PROPOFOL;  Surgeon: West Bali, MD;  Location: [facility];  Service: Endoscopy;  Laterality: N/A;  9:30am   KNEE ARTHROSCOPY Right    NASAL SINUS SURGERY     NM MYOCAR PERF WALL MOTION  10/2011   bruce myoview - breast attenuation noted in anterior region; EF 65%; no ischemia/infarct/scar; low risk   right ankle surgery     SAVORY DILATION N/A 07/11/2018   Procedure: SAVORY DILATION;  Surgeon: West Bali, MD;  Location: [facility];  Service: Endoscopy;  Laterality: N/A;   SHOULDER SURGERY     TONSILLECTOMY  1980   TUBAL LIGATION  Past Family History   Family History  Problem Relation Age of Onset   Diabetes Mother    Hypertension Mother    Hyperlipidemia Mother    Ulcerative colitis Father    Breast cancer Maternal Grandmother    Hypertension Maternal Grandmother    Diabetes Maternal Grandmother    Stroke Maternal Grandmother    Asthma Maternal Grandmother    Stroke Maternal Grandfather    Stroke Paternal Grandmother    Diabetes Paternal Grandmother    Heart disease Paternal Grandmother    Heart disease Paternal Grandfather    Hyperlipidemia Brother    Hyperlipidemia Sister    Hypertension Sister    Diabetes Sister    Down syndrome Child    Heart disease Child    Colon cancer Neg Hx     Past Social History   Social History   Socioeconomic History   Marital status: Married    Spouse name: Remi Deter   Number of children: 1   Years of education: 14   Highest education level: Not on file  Occupational History   Occupation: Unemployed    Employer: [employer]    Employer: UNEMPLOYED  Tobacco Use   Smoking status: Former    Current  packs/day: 0.00    Average packs/day: 0.5 packs/day for 40.0 years (20.0 ttl pk-yrs)    Types: Cigarettes    Start date: 12/24/1973    Quit date: 12/24/2013    Years since quitting: 9.4   Smokeless tobacco: Never   Tobacco comments:    using vape as aid to sustain from smoking  Vaping Use   Vaping status: Never Used  Substance and Sexual Activity   Alcohol use: No   Drug use: No   Sexual activity: Not on file  Other Topics Concern   Not on file  Social History Narrative   One son, Down syndrome,is right handed ,resides with son and husband   Right handed   Social Determinants of Health   Financial Resource Strain: Not on file  Food Insecurity: Not on file  Transportation Needs: Not on file  Physical Activity: Not on file  Stress: Not on file  Social Connections: Not on file  Intimate Partner Violence: Not on file    Review of Systems   General: Negative for anorexia, weight loss, fever, chills, fatigue, weakness. Eyes: Negative for vision changes.  ENT: Negative for hoarseness, difficulty swallowing, +nasal congestion. CV: Negative for chest pain, angina, palpitations, dyspnea on exertion, +peripheral edema.  Respiratory: Negative for dyspnea at rest, dyspnea on exertion, cough, sputum, wheezing.  GI: See history of present illness. GU:  Negative for dysuria, hematuria, urinary incontinence, urinary frequency, nocturnal urination.  MS: Negative for joint pain, low back pain.  Derm: Negative for rash or itching.  Neuro: Negative for weakness, abnormal sensation, seizure, frequent headaches, memory loss,  confusion.  Psych: Negative for anxiety, depression, suicidal ideation, hallucinations. See hpi Endo: Negative for unusual weight change.  Heme: Negative for bruising or bleeding. Allergy: Negative for rash or hives.  Physical Exam   BP 106/72   Pulse 93   Temp 97.9 F (36.6 C)   Ht 5\' 8"  (1.727 m)   Wt 204 lb (92.5 kg)   BMI 31.02 kg/m    General:  Well-nourished, well-developed in no acute distress.  Head: Normocephalic, atraumatic.   Eyes: Conjunctiva pink, no icterus. Mouth: Oropharyngeal mucosa moist and pink  Neck: Supple without thyromegaly, masses, or lymphadenopathy.  Lungs: Clear to auscultation bilaterally.  Heart:  Regular rate and rhythm, no murmurs rubs or gallops.  Abdomen: Bowel sounds are normal, nontender, nondistended, no hepatosplenomegaly or masses,  no abdominal bruits or hernia, no rebound or guarding.   Rectal: not performed Extremities: No lower extremity edema. No clubbing or deformities.  Neuro: Alert and oriented x 4 , grossly normal neurologically.  Skin: Warm and dry, no rash or jaundice.   Psych: Alert and cooperative, normal mood and affect.  Labs   Lab Results  Component Value Date   NA 142 01/28/2023   CL 106 01/28/2023   K 4.7 01/28/2023   CO2 21 01/28/2023   BUN 12 01/28/2023   CREATININE 0.90 01/28/2023   EGFR 72 01/28/2023   CALCIUM 9.2 01/28/2023   ALBUMIN 4.9 02/10/2018   GLUCOSE 100 (H) 01/28/2023      Imaging Studies   No results found.  Assessment   *Diarrhea *Bloating *Abdominal cramping *GERD   Reflux symptoms with breakthrough symptoms at times. Postprandial diarrhea, bloating, cramping possibly flare of IBS. Doubt infectious etiology but we did discuss possible stool studies to rule out C.Diff but she declined stating her stools do not have the typical smell. Remote celiac serologies were negative. She is s/p cholecystectomy so bile acid diarrhea could be playing a role. Reported one black stool this morning. Offered blood work, she declined. Will monitor. She is overdue for colonoscopy. At this time offered colonoscopy for screening and to evaluate her diarrhea.    PLAN   Start colestid 2 g once daily, separate from other medications by two hours minimum. Continue dicyclomine as needed for cramping, diarrhea. She is limited due to drowsiness.  Continue pantoprazole  once to twice daily for reflux. If ongoing breakthrough symptoms, we can consider changing in the future.  Monitor for further black stools, recommend labs if occur.  Colonoscopy to be scheduled. ASA 3.  I have discussed the risks, alternatives, benefits with regards to but not limited to the risk of reaction to medication, bleeding, infection, perforation and the patient is agreeable to proceed. Written consent to be obtained.    Leanna Battles. Melvyn Neth, MHS, PA-C [facility]

## 2023-06-08 NOTE — Patient Instructions (Signed)
Start Colestid 2 g once daily.  Do not take within 2 hours of other medications.  You may continue dicyclomine as needed for cramping and diarrhea.  Continue pantoprazole once to twice daily before a meal for now. We can discuss changing at a later date if ongoing breakthrough symptoms.  We will be in touch to schedule colonoscopy with Dr. Marletta Lor.  If you have any further black stools, let me know so we can do some blood work. Reach out with any questions or concerns.

## 2023-06-13 ENCOUNTER — Telehealth: Payer: Self-pay | Admitting: *Deleted

## 2023-06-13 ENCOUNTER — Encounter: Payer: Self-pay | Admitting: *Deleted

## 2023-06-13 NOTE — Telephone Encounter (Signed)
LMOVM to call back to schedule TCS w/ Dr. Marletta Lor, ASA 3, doesn't want trilyte

## 2023-06-14 ENCOUNTER — Encounter: Payer: Self-pay | Admitting: *Deleted

## 2023-06-14 NOTE — Telephone Encounter (Signed)
Pt has been scheduled for 07/04/23. Pt will come by to pick up clenpiq sample and instructions.   [employer] [id number] or Prior Authorization is not required for the requested services You are not required to submit a notification/prior authorization based on the information provided. If you have general questions about the prior authorization requirements, visit [email or web address] > Clinician Resources > Advance and Admission Notification Requirements. The number above acknowledges your notification. Please write this reference number down for future reference. If you would like to request an organization determination, please call us at [phone]. Decision ID #: [id number]

## 2023-06-15 DIAGNOSIS — J0130 Acute sphenoidal sinusitis, unspecified: Secondary | ICD-10-CM | POA: Diagnosis not present

## 2023-06-15 DIAGNOSIS — J323 Chronic sphenoidal sinusitis: Secondary | ICD-10-CM | POA: Diagnosis not present

## 2023-06-28 NOTE — Patient Instructions (Signed)
Natasha Wright  06/28/2023     @PREFPERIOPPHARMACY @   Your procedure is scheduled on  07/04/2023.    Report to [facility] at  0930  A.M.   Call this number if you have problems the morning of surgery:  [phone]  If you experience any cold or flu symptoms such as cough, fever, chills, shortness of breath, etc. between now and your scheduled surgery, please notify us at the above number.   Remember:  Follow the diet and prep instructions given to you by the office.     Take these medicines the morning of surgery with A SIP OF WATER                 atenolol, celexa, metoprolol, pantoprazole.     Do not wear jewelry, make-up or nail polish, including gel polish,  artificial nails, or any other type of covering on natural nails (fingers and  toes).  Do not wear lotions, powders, or perfumes, or deodorant.  Do not shave 48 hours prior to surgery.  Men may shave face and neck.  Do not bring valuables to the hospital.  [facility] is not responsible for any belongings or valuables.  Contacts, dentures or bridgework may not be worn into surgery.  Leave your suitcase in the car.  After surgery it may be brought to your room.  For patients admitted to the hospital, discharge time will be determined by your treatment team.  Patients discharged the day of surgery will not be allowed to drive home and must have someone with them for 24 hours.    Special instructions:   DO NOT smoke tobacco or vape for 24 hours before your procedure.  Please read over the following fact sheets that you were given. Anesthesia Post-op Instructions and Care and Recovery After Surgery      Colonoscopy, Adult, Care After The following information offers guidance on how to care for yourself after your procedure. Your health care provider may also give you more specific instructions. If you have problems or questions, contact your health care provider. What can I expect after the  procedure? After the procedure, it is common to have: A small amount of blood in your stool for 24 hours after the procedure. Some gas. Mild cramping or bloating of your abdomen. Follow these instructions at home: Eating and drinking  Drink enough fluid to keep your urine pale yellow. Follow instructions from your health care provider about eating or drinking restrictions. Resume your normal diet as told by your health care provider. Avoid heavy or fried foods that are hard to digest. Activity Rest as told by your health care provider. Avoid sitting for a long time without moving. Get up to take short walks every 1-2 hours. This is important to improve blood flow and breathing. Ask for help if you feel weak or unsteady. Return to your normal activities as told by your health care provider. Ask your health care provider what activities are safe for you. Managing cramping and bloating  Try walking around when you have cramps or feel bloated. If directed, apply heat to your abdomen as told by your health care provider. Use the heat source that your health care provider recommends, such as a moist heat pack or a heating pad. Place a towel between your skin and the heat source. Leave the heat on for 20-30 minutes. Remove the heat if your skin turns bright red. This is especially important if  you are unable to feel pain, heat, or cold. You have a greater risk of getting burned. General instructions If you were given a sedative during the procedure, it can affect you for several hours. Do not drive or operate machinery until your health care provider says that it is safe. For the first 24 hours after the procedure: Do not sign important documents. Do not drink alcohol. Do your regular daily activities at a slower pace than normal. Eat soft foods that are easy to digest. Take over-the-counter and prescription medicines only as told by your health care provider. Keep all follow-up visits. This  is important. Contact a health care provider if: You have blood in your stool 2-3 days after the procedure. Get help right away if: You have more than a small spotting of blood in your stool. You have large blood clots in your stool. You have swelling of your abdomen. You have nausea or vomiting. You have a fever. You have increasing pain in your abdomen that is not relieved with medicine. These symptoms may be an emergency. Get help right away. Call 911. Do not wait to see if the symptoms will go away. Do not drive yourself to the hospital. Summary After the procedure, it is common to have a small amount of blood in your stool. You may also have mild cramping and bloating of your abdomen. If you were given a sedative during the procedure, it can affect you for several hours. Do not drive or operate machinery until your health care provider says that it is safe. Get help right away if you have a lot of blood in your stool, nausea or vomiting, a fever, or increased pain in your abdomen. This information is not intended to replace advice given to you by your health care provider. Make sure you discuss any questions you have with your health care provider. Document Revised: 12/21/2022 Document Reviewed: 07/01/2021 Elsevier Patient Education  2024 [employer]vier [employer]. Monitored Anesthesia Care, Care After The following information offers guidance on how to care for yourself after your procedure. Your health care provider may also give you more specific instructions. If you have problems or questions, contact your health care provider. What can I expect after the procedure? After the procedure, it is common to have: Tiredness. Little or no memory about what happened during or after the procedure. Impaired judgment when it comes to making decisions. Nausea or vomiting. Some trouble with balance. Follow these instructions at home: For the time period you were told by your health care  provider:  Rest. Do not participate in activities where you could fall or become injured. Do not drive or use machinery. Do not drink alcohol. Do not take sleeping pills or medicines that cause drowsiness. Do not make important decisions or sign legal documents. Do not take care of children on your own. Medicines Take over-the-counter and prescription medicines only as told by your health care provider. If you were prescribed antibiotics, take them as told by your health care provider. Do not stop using the antibiotic even if you start to feel better. Eating and drinking Follow instructions from your health care provider about what you may eat and drink. Drink enough fluid to keep your urine pale yellow. If you vomit: Drink clear fluids slowly and in small amounts as you are able. Clear fluids include water, ice chips, low-calorie sports drinks, and fruit juice that has water added to it (diluted fruit juice). Eat light and bland foods in small amounts  as you are able. These foods include bananas, applesauce, rice, lean meats, toast, and crackers. General instructions  Have a responsible adult stay with you for the time you are told. It is important to have someone help care for you until you are awake and alert. If you have sleep apnea, surgery and some medicines can increase your risk for breathing problems. Follow instructions from your health care provider about wearing your sleep device: When you are sleeping. This includes during daytime naps. While taking prescription pain medicines, sleeping medicines, or medicines that make you drowsy. Do not use any products that contain nicotine or tobacco. These products include cigarettes, chewing tobacco, and vaping devices, such as e-cigarettes. If you need help quitting, ask your health care provider. Contact a health care provider if: You feel nauseous or vomit every time you eat or drink. You feel light-headed. You are still sleepy or  having trouble with balance after 24 hours. You get a rash. You have a fever. You have redness or swelling around the IV site. Get help right away if: You have trouble breathing. You have new confusion after you get home. These symptoms may be an emergency. Get help right away. Call 911. Do not wait to see if the symptoms will go away. Do not drive yourself to the hospital. This information is not intended to replace advice given to you by your health care provider. Make sure you discuss any questions you have with your health care provider. Document Revised: 04/05/2022 Document Reviewed: 04/05/2022 Elsevier Patient Education  2024 [employer].

## 2023-06-29 ENCOUNTER — Encounter (HOSPITAL_COMMUNITY)
Admission: RE | Admit: 2023-06-29 | Discharge: 2023-06-29 | Disposition: A | Discharge status: Home / Self Care | Payer: Medicare Other | Source: Ambulatory Visit | Attending: Internal Medicine | Admitting: Internal Medicine

## 2023-06-29 VITALS — BP 106/72 | HR 93 | Temp 97.9°F | Resp 18 | Ht 68.0 in | Wt 204.0 lb

## 2023-06-29 DIAGNOSIS — Z79899 Other long term (current) drug therapy: Secondary | ICD-10-CM | POA: Diagnosis not present

## 2023-06-29 DIAGNOSIS — Z01818 Encounter for other preprocedural examination: Secondary | ICD-10-CM | POA: Diagnosis present

## 2023-06-29 DIAGNOSIS — Z01812 Encounter for preprocedural laboratory examination: Secondary | ICD-10-CM | POA: Insufficient documentation

## 2023-06-29 LAB — BASIC METABOLIC PANEL
Anion gap: 10 (ref 5–15)
BUN: 10 mg/dL (ref 8–23)
CO2: 22 mmol/L (ref 22–32)
Calcium: 8.8 mg/dL — ABNORMAL LOW (ref 8.9–10.3)
Chloride: 105 mmol/L (ref 98–111)
Creatinine, Ser: 0.89 mg/dL (ref 0.44–1.00)
GFR, Estimated: >60 mL/min (ref >60)
Glucose, Bld: 104 mg/dL — ABNORMAL HIGH (ref 70–99)
Potassium: 4.1 mmol/L (ref 3.5–5.1)
Sodium: 137 mmol/L (ref 135–145)

## 2023-07-04 ENCOUNTER — Ambulatory Visit (HOSPITAL_COMMUNITY)
Admission: RE | Admit: 2023-07-04 | Discharge: 2023-07-04 | Disposition: A | Discharge status: Home / Self Care | Payer: Medicare Other | Source: Ambulatory Visit | Attending: Internal Medicine | Admitting: Internal Medicine

## 2023-07-04 ENCOUNTER — Encounter (HOSPITAL_COMMUNITY)
Admission: RE | Disposition: A | Discharge status: Home / Self Care | Payer: Self-pay | Source: Ambulatory Visit | Attending: Internal Medicine

## 2023-07-04 ENCOUNTER — Ambulatory Visit (HOSPITAL_BASED_OUTPATIENT_CLINIC_OR_DEPARTMENT_OTHER): Payer: Medicare Other | Admitting: Anesthesiology

## 2023-07-04 ENCOUNTER — Ambulatory Visit (HOSPITAL_COMMUNITY): Payer: Medicare Other | Admitting: Anesthesiology

## 2023-07-04 DIAGNOSIS — Z87891 Personal history of nicotine dependence: Secondary | ICD-10-CM | POA: Diagnosis not present

## 2023-07-04 DIAGNOSIS — K529 Noninfective gastroenteritis and colitis, unspecified: Secondary | ICD-10-CM | POA: Diagnosis not present

## 2023-07-04 DIAGNOSIS — K219 Gastro-esophageal reflux disease without esophagitis: Secondary | ICD-10-CM | POA: Insufficient documentation

## 2023-07-04 DIAGNOSIS — D128 Benign neoplasm of rectum: Secondary | ICD-10-CM

## 2023-07-04 DIAGNOSIS — D126 Benign neoplasm of colon, unspecified: Secondary | ICD-10-CM

## 2023-07-04 DIAGNOSIS — K621 Rectal polyp: Secondary | ICD-10-CM | POA: Diagnosis not present

## 2023-07-04 DIAGNOSIS — D122 Benign neoplasm of ascending colon: Secondary | ICD-10-CM | POA: Diagnosis not present

## 2023-07-04 DIAGNOSIS — K635 Polyp of colon: Secondary | ICD-10-CM

## 2023-07-04 DIAGNOSIS — R197 Diarrhea, unspecified: Secondary | ICD-10-CM

## 2023-07-04 DIAGNOSIS — D125 Benign neoplasm of sigmoid colon: Secondary | ICD-10-CM | POA: Diagnosis not present

## 2023-07-04 DIAGNOSIS — K580 Irritable bowel syndrome with diarrhea: Secondary | ICD-10-CM | POA: Insufficient documentation

## 2023-07-04 DIAGNOSIS — K5730 Diverticulosis of large intestine without perforation or abscess without bleeding: Secondary | ICD-10-CM | POA: Diagnosis not present

## 2023-07-04 DIAGNOSIS — I493 Ventricular premature depolarization: Secondary | ICD-10-CM | POA: Diagnosis not present

## 2023-07-04 DIAGNOSIS — K591 Functional diarrhea: Secondary | ICD-10-CM | POA: Diagnosis not present

## 2023-07-04 DIAGNOSIS — K648 Other hemorrhoids: Secondary | ICD-10-CM | POA: Insufficient documentation

## 2023-07-04 HISTORY — PX: POLYPECTOMY: SHX5525

## 2023-07-04 HISTORY — PX: COLONOSCOPY WITH PROPOFOL: SHX5780

## 2023-07-04 HISTORY — PX: BIOPSY: SHX5522

## 2023-07-04 SURGERY — COLONOSCOPY WITH PROPOFOL
Anesthesia: General

## 2023-07-04 MED ORDER — PHENYLEPHRINE 80 MCG/ML (10ML) SYRINGE FOR IV PUSH (FOR BLOOD PRESSURE SUPPORT)
PREFILLED_SYRINGE | INTRAVENOUS | Status: DC | PRN
  Administered 2023-07-04: 160 ug via INTRAVENOUS
  Administered 2023-07-04: 80 ug via INTRAVENOUS
  Administered 2023-07-04: 160 ug via INTRAVENOUS

## 2023-07-04 MED ORDER — PHENYLEPHRINE 80 MCG/ML (10ML) SYRINGE FOR IV PUSH (FOR BLOOD PRESSURE SUPPORT)
PREFILLED_SYRINGE | INTRAVENOUS | Status: AC
  Filled 2023-07-04: qty 10

## 2023-07-04 MED ORDER — LACTATED RINGERS IV SOLN: INTRAVENOUS | Status: DC

## 2023-07-04 MED ORDER — LIDOCAINE HCL (CARDIAC) PF 100 MG/5ML IV SOSY
PREFILLED_SYRINGE | INTRAVENOUS | Status: DC | PRN
  Administered 2023-07-04: 80 mg via INTRAVENOUS

## 2023-07-04 MED ORDER — PROPOFOL 10 MG/ML IV BOLUS
INTRAVENOUS | Status: DC | PRN
Start: 2023-07-04 — End: 2023-07-04
  Administered 2023-07-04: 80 mg via INTRAVENOUS

## 2023-07-04 MED ORDER — PROPOFOL 500 MG/50ML IV EMUL
INTRAVENOUS | Status: DC | PRN
  Administered 2023-07-04: 150 ug/kg/min via INTRAVENOUS

## 2023-07-04 NOTE — Anesthesia Postprocedure Evaluation (Signed)
Anesthesia Post Note  Patient: Natasha Wright  Procedure(s) Performed: COLONOSCOPY WITH PROPOFOL POLYPECTOMY BIOPSY  Patient location during evaluation: Phase II Anesthesia Type: General Level of consciousness: awake and alert and oriented Pain management: pain level controlled Vital Signs Assessment: post-procedure vital signs reviewed and stable Respiratory status: spontaneous breathing, nonlabored ventilation and respiratory function stable Cardiovascular status: blood pressure returned to baseline and stable Postop Assessment: no apparent nausea or vomiting Anesthetic complications: no  No notable events documented.   Last Vitals:  Vitals:   07/04/23 1045 07/04/23 1052  BP: 99/66 112/68  Pulse:    Resp:    Temp:    SpO2:      Last Pain:  Vitals:   07/04/23 1038  TempSrc: Axillary  PainSc: 0-No pain                 Dustyn Armbrister C Jacquelin Krajewski

## 2023-07-04 NOTE — Interval H&P Note (Signed)
History and Physical Interval Note:  07/04/2023 10:04 AM  Natasha Wright  has presented today for surgery, with the diagnosis of chronic diarrhea.  The various methods of treatment have been discussed with the patient and family. After consideration of risks, benefits and other options for treatment, the patient has consented to  Procedure(s) with comments: COLONOSCOPY WITH PROPOFOL (N/A) - 10:45 AM, ASA 3, pt knows to arrive at 8:00 as a surgical intervention.  The patient's history has been reviewed, patient examined, no change in status, stable for surgery.  I have reviewed the patient's chart and labs.  Questions were answered to the patient's satisfaction.     Lanelle Bal

## 2023-07-04 NOTE — Op Note (Signed)
[facility] Patient Name: Natasha Wright Procedure Date: 07/04/2023 10:04 AM MRN: [id number] Date of Birth: Jan 30, 1961 Attending MD: Hennie Duos. Marletta Lor , [place], [id number] CSN: [id number] Age: 62 Admit Type: Outpatient Procedure:                Colonoscopy Indications:              Chronic diarrhea Providers:                Hennie Duos. Marletta Lor, DO, Tammy Vaught, RN, Zena Amos Referring MD:              Medicines:                See the Anesthesia note for documentation of the                            administered medications Complications:            No immediate complications. Estimated Blood Loss:     Estimated blood loss was minimal. Procedure:                Pre-Anesthesia Assessment:                           - The anesthesia plan was to use monitored                            anesthesia care (MAC).                           After obtaining informed consent, the colonoscope                            was passed under direct vision. Throughout the                            procedure, the patient's blood pressure, pulse, and                            oxygen saturations were monitored continuously. The                            [id number]-HQ190L ([id number]) scope was introduced through                            the anus and advanced to the the cecum, identified                            by appendiceal orifice and ileocecal valve. The                            colonoscopy was performed without difficulty. The                            patient tolerated the procedure well. The quality  of the bowel preparation was evaluated using the                            BBPS [facility] Bowel Preparation Scale) with scores                            of: Right Colon = 2 (minor amount of residual                            staining, small fragments of stool and/or opaque                            liquid, but mucosa seen well), Transverse  Colon = 2                            (minor amount of residual staining, small fragments                            of stool and/or opaque liquid, but mucosa seen                            well) and Left Colon = 2 (minor amount of residual                            staining, small fragments of stool and/or opaque                            liquid, but mucosa seen well). The total BBPS score                            equals 6. Fair. Scope In: 10:15:36 AM Scope Out: 10:32:07 AM Scope Withdrawal Time: 0 hours 14 minutes 57 seconds  Total Procedure Duration: 0 hours 16 minutes 31 seconds  Findings:      A few medium-mouthed and small-mouthed diverticula were found in the       sigmoid colon.      An 8 mm polyp was found in the ascending colon. The polyp was sessile.       The polyp was removed with a cold snare. Resection and retrieval were       complete.      A 5 mm polyp was found in the rectum. The polyp was sessile. The polyp       was removed with a cold snare. Resection and retrieval were complete.      Biopsies for histology were taken with a cold forceps from the ascending       colon, transverse colon and descending colon for evaluation of       microscopic colitis. Impression:               - Diverticulosis in the sigmoid colon.                           - One 8 mm polyp in the ascending colon, removed  with a cold snare. Resected and retrieved.                           - One 5 mm polyp in the rectum, removed with a cold                            snare. Resected and retrieved.                           - Biopsies were taken with a cold forceps from the                            ascending colon, transverse colon and descending                            colon for evaluation of microscopic colitis. Moderate Sedation:      Per Anesthesia Care Recommendation:           - Patient has a contact number available for                             emergencies. The signs and symptoms of potential                            delayed complications were discussed with the                            patient. Return to normal activities tomorrow.                            Written discharge instructions were provided to the                            patient.                           - Resume previous diet.                           - Continue present medications.                           - Await pathology results.                           - Repeat colonoscopy in 5 years for surveillance.                           - Return to GI clinic in 3 months. Procedure Code(s):        --- Professional ---                           [id number], Colonoscopy, flexible; with removal of                            tumor(s),  polyp(s), or other lesion(s) by snare                            technique                           45380, 59, Colonoscopy, flexible; with biopsy,                            single or multiple Diagnosis Code(s):        --- Professional ---                           D12.2, Benign neoplasm of ascending colon                           D12.8, Benign neoplasm of rectum                           K52.9, Noninfective gastroenteritis and colitis,                            unspecified                           K57.30, Diverticulosis of large intestine without                            perforation or abscess without bleeding CPT copyright 2022 American Medical Association. All rights reserved. The codes documented in this report are preliminary and upon coder review may  be revised to meet current compliance requirements. Hennie Duos. Marletta Lor, DO Hennie Duos. Marletta Lor, DO 07/04/2023 10:35:46 AM This report has been signed electronically. Number of Addenda: 0

## 2023-07-04 NOTE — Discharge Instructions (Signed)
  Colonoscopy Discharge Instructions  Read the instructions outlined below and refer to this sheet in the next few weeks. These discharge instructions provide you with general information on caring for yourself after you leave the hospital. Your doctor may also give you specific instructions. While your treatment has been planned according to the most current medical practices available, unavoidable complications occasionally occur.   ACTIVITY You may resume your regular activity, but move at a slower pace for the next 24 hours.  Take frequent rest periods for the next 24 hours.  Walking will help get rid of the air and reduce the bloated feeling in your belly (abdomen).  No driving for 24 hours (because of the medicine (anesthesia) used during the test).   Do not sign any important legal documents or operate any machinery for 24 hours (because of the anesthesia used during the test).  NUTRITION Drink plenty of fluids.  You may resume your normal diet as instructed by your doctor.  Begin with a light meal and progress to your normal diet. Heavy or fried foods are harder to digest and may make you feel sick to your stomach (nauseated).  Avoid alcoholic beverages for 24 hours or as instructed.  MEDICATIONS You may resume your normal medications unless your doctor tells you otherwise.  WHAT YOU CAN EXPECT TODAY Some feelings of bloating in the abdomen.  Passage of more gas than usual.  Spotting of blood in your stool or on the toilet paper.  IF YOU HAD POLYPS REMOVED DURING THE COLONOSCOPY: No aspirin products for 7 days or as instructed.  No alcohol for 7 days or as instructed.  Eat a soft diet for the next 24 hours.  FINDING OUT THE RESULTS OF YOUR TEST Not all test results are available during your visit. If your test results are not back during the visit, make an appointment with your caregiver to find out the results. Do not assume everything is normal if you have not heard from your  caregiver or the medical facility. It is important for you to follow up on all of your test results.  SEEK IMMEDIATE MEDICAL ATTENTION IF: You have more than a spotting of blood in your stool.  Your belly is swollen (abdominal distention).  You are nauseated or vomiting.  You have a temperature over 101.  You have abdominal pain or discomfort that is severe or gets worse throughout the day.   Your colonoscopy revealed 2 polyp(s) which I removed successfully. Await pathology results, my office will contact you. I recommend repeating colonoscopy in 5 years for surveillance purposes.   You also have mild diverticulosis. I would recommend increasing fiber in your diet or adding OTC Benefiber/Metamucil. Be sure to drink at least 4 to 6 glasses of water daily.   I also took samples of your colon to rule out a condition called microscopic colitis which can cause chronically loose stools especially in women.  Continue on Colestid.  Follow-up with GI in 3 months   I hope you have a great rest of your week!  Hennie Duos. Marletta Lor, D.O. Gastroenterology and Hepatology [facility]

## 2023-07-04 NOTE — Transfer of Care (Addendum)
Immediate Anesthesia Transfer of Care Note  Patient: Natasha Wright  Procedure(s) Performed: COLONOSCOPY WITH PROPOFOL POLYPECTOMY BIOPSY  Patient Location: Short Stay  Anesthesia Type:General  Level of Consciousness: drowsy and patient cooperative  Airway & Oxygen Therapy: Patient Spontanous Breathing and Patient connected to nasal cannula oxygen  Post-op Assessment: Report given to RN and Post -op Vital signs reviewed and stable  Post vital signs: Reviewed and stable  Last Vitals:  Vitals Value Taken Time  BP 99/47 07/04/23   1038  Temp 36.4 07/04/23   1038  Pulse 66 07/04/23   1038  Resp 15 07/04/23   1038  SpO2 97% 07/04/23   1038    Last Pain:  Vitals:   07/04/23 1010  TempSrc:   PainSc: 5       Patients Stated Pain Goal: 5 (07/04/23 0936)  Complications: No notable events documented.

## 2023-07-04 NOTE — Anesthesia Preprocedure Evaluation (Signed)
Anesthesia Evaluation  Patient identified by MRN, date of birth, ID band Patient awake    Reviewed: Allergy & Precautions, H&P , NPO status , Patient's Chart, lab work & pertinent test results, reviewed documented beta blocker date and time   History of Anesthesia Complications (+) PONV and history of anesthetic complications  Airway Mallampati: II  TM Distance: >3 FB Neck ROM: Limited   Comment: Chronic neck pain Cervical spinal stenosis, neck fusion Dental  (+) Dental Advisory Given, Missing   Pulmonary neg pulmonary ROS, Patient abstained from smoking., former smoker   Pulmonary exam normal breath sounds clear to auscultation       Cardiovascular + Peripheral Vascular Disease  Normal cardiovascular exam+ dysrhythmias (PVCs)  Rhythm:Regular Rate:Normal  1. Left ventricular ejection fraction, by estimation, is 60 to 65%. The  left ventricle has normal function. The left ventricle has no regional  wall motion abnormalities. Left ventricular diastolic parameters were  normal.   2. Right ventricular systolic function is normal. The right ventricular  size is normal. There is normal pulmonary artery systolic pressure. The  estimated right ventricular systolic pressure is 21.3 mmHg.   3. The mitral valve is grossly normal. Trivial mitral valve  regurgitation. No evidence of mitral stenosis.   4. The aortic valve is tricuspid. Aortic valve regurgitation is not  visualized. No aortic stenosis is present.   5. The inferior vena cava is normal in size with greater than 50%  respiratory variability, suggesting right atrial pressure of 3 mmHg.     Neuro/Psych  PSYCHIATRIC DISORDERS Anxiety      Neuromuscular disease    GI/Hepatic Neg liver ROS,GERD  Medicated,,  Endo/Other  negative endocrine ROS    Renal/GU negative Renal ROS  negative genitourinary   Musculoskeletal  (+) Arthritis , Osteoarthritis,  Fibromyalgia -   Abdominal   Peds negative pediatric ROS (+)  Hematology negative hematology ROS (+)   Anesthesia Other Findings   Reproductive/Obstetrics negative OB ROS                             Anesthesia Physical Anesthesia Plan  ASA: 2  Anesthesia Plan: General   Post-op Pain Management: Minimal or no pain anticipated   Induction: Intravenous  PONV Risk Score and Plan: 1 and Propofol infusion  Airway Management Planned: Nasal Cannula and Natural Airway  Additional Equipment:   Intra-op Plan:   Post-operative Plan:   Informed Consent: I have reviewed the patients History and Physical, chart, labs and discussed the procedure including the risks, benefits and alternatives for the proposed anesthesia with the patient or authorized representative who has indicated his/her understanding and acceptance.     Dental advisory given  Plan Discussed with: CRNA and Surgeon  Anesthesia Plan Comments:         Anesthesia Quick Evaluation

## 2023-07-06 ENCOUNTER — Encounter (HOSPITAL_COMMUNITY): Payer: Self-pay | Admitting: Internal Medicine

## 2023-07-21 ENCOUNTER — Other Ambulatory Visit: Payer: Self-pay | Admitting: Oncology

## 2023-07-21 DIAGNOSIS — Z006 Encounter for examination for normal comparison and control in clinical research program: Secondary | ICD-10-CM

## 2023-07-28 ENCOUNTER — Ambulatory Visit: Payer: Medicare Other | Attending: Internal Medicine | Admitting: Internal Medicine

## 2023-07-28 ENCOUNTER — Encounter: Payer: Self-pay | Admitting: Internal Medicine

## 2023-07-28 VITALS — BP 100/66 | HR 82 | Ht 68.0 in | Wt 200.6 lb

## 2023-07-28 DIAGNOSIS — I493 Ventricular premature depolarization: Secondary | ICD-10-CM | POA: Diagnosis not present

## 2023-07-28 DIAGNOSIS — I10 Essential (primary) hypertension: Secondary | ICD-10-CM | POA: Insufficient documentation

## 2023-07-28 DIAGNOSIS — I517 Cardiomegaly: Secondary | ICD-10-CM

## 2023-07-28 DIAGNOSIS — I5032 Chronic diastolic (congestive) heart failure: Secondary | ICD-10-CM

## 2023-07-28 MED ORDER — SPIRONOLACTONE 25 MG PO TABS: 25.0000 mg | ORAL_TABLET | Freq: Every day | ORAL | 3 refills | Status: DC

## 2023-07-28 NOTE — Progress Notes (Signed)
Cardiology Office Note:    Date:  07/28/2023   ID:  Natasha Wright, DOB 06-Jun-1961, MRN [id number]  PCP:  Natasha Nevins, MD   Natasha Wright Cardiologist:  Natasha Constant, MD     Referring MD: Natasha Nevins, MD   CC: F/u PVCs  History of Present Illness:    Natasha Wright is a 62 y.o. female with recent right knee arthroscopy. 2023: hx of PVCs see [facility] Dr. Lowella Wright in the past. On low dose BB and PRN lasix. Saw Dr. Lowella Wright son has had multiple ablations.  Has been on life support. Had normal Echo and Cardiac CT. Felt symptoms with metoprolol then cardizem. 2024: Had a cold then covid-19. Patient has been fighting a cold that has been lingering.  Has been feeling joint stiffening and has been needing surgery. Had stomach virus late 2023.  Took a while to recover.  Then had COVID-19. Is getting evaluated for Addison's disease and had low cortisol. Right after her infection noted worsening leg swelling. 2024: Working to focus on her health today.  Patient notes that she is doing well.   Since last visit notes that she was having significant diarrhea and is seeing GI . There are no interval hospital/ED visit.   EKG showed no PVCs. Diagnosed with Bile acid malabsorption. Is on some new medications.   No chest pain or pressure .  No SOB/DOE and no PND/Orthopnea.  No weight gain or leg swelling.  No palpitations or syncope. Increase spironolactone- with that the K was normal.  Has not needed lasix on higher dose.    Past Medical History:  Diagnosis Date   Anxiety    Arthritis    B12 deficiency    h/o   Family history of heart disease    GERD (gastroesophageal reflux disease)    Hypoglycemia    IBS (irritable bowel syndrome)    Lumbar disc disease    OA (osteoarthritis)    PONV (postoperative nausea and vomiting)    needs scop patch   Rectocele    Shingles    Spinal stenosis in cervical region     Past Surgical History:  Procedure  Laterality Date   ABDOMINAL HYSTERECTOMY  1992   ARTHRODESIS FOOT WITH WEIL OSTEOTOMY Left 05/09/2014   Procedure: LEFT FIRST TARSAL METATARSAL  ARTHRODESIS; LEFT SECOND WEIL OSTEOTOMY;  Surgeon: Natasha Arthurs, MD;  Location: [facility];  Service: Orthopedics;  Laterality: Left;   BIOPSY  07/11/2018   Procedure: BIOPSY;  Surgeon: Natasha Bali, MD;  [facility];  Service: Endoscopy;;  duodenal biopsy , gastric biopsy    BIOPSY  07/04/2023   Procedure: BIOPSY;  Surgeon: Natasha Bal, DO;  [facility];  Service: Endoscopy;;   BUNIONECTOMY WITH HAMMERTOE RECONSTRUCTION Left 05/09/2014   Procedure: LEFT MODIFIED MCBRIDE BUNIONECTOMY; LEFT SECOND HAMMER TOE CORRECTION;  Surgeon: Natasha Arthurs, MD;  Location: [facility];  Service: Orthopedics;  Laterality: Left;   CERVICAL SPINE SURGERY  2009   CHOLECYSTECTOMY  2004   lap choli   COLONOSCOPY  07/08/2003   RMR: Left-sided diverticula, remainder of colonic mucosa and terminal ileum appeared normal.. Minimally friable internal hemorrhoids, otherwise normal rectum   COLONOSCOPY N/A 04/30/2013   Dr. Darrick Wright: mild diverticulosis in descending and sigmoid colon, internal hemorrhoids.    COLONOSCOPY WITH PROPOFOL N/A 07/04/2023   Procedure: COLONOSCOPY WITH PROPOFOL;  Surgeon: Natasha Bal, DO;  [facility];  Service: Endoscopy;  Laterality: N/A;  10:45 AM, ASA 3, pt knows to arrive at 8:00   DILATION AND CURETTAGE OF UTERUS     ESOPHAGOGASTRODUODENOSCOPY (EGD) WITH PROPOFOL N/A 07/11/2018   Procedure: ESOPHAGOGASTRODUODENOSCOPY (EGD) WITH PROPOFOL;  Surgeon: Natasha Bali, MD;  [facility];  Service: Endoscopy;  Laterality: N/A;  9:30am   KNEE ARTHROSCOPY Right    NASAL SINUS SURGERY     NM MYOCAR PERF WALL MOTION  10/2011   bruce myoview - breast attenuation noted in anterior region; EF 65%; no ischemia/infarct/scar; low risk   POLYPECTOMY  07/04/2023   Procedure:  POLYPECTOMY;  Surgeon: Natasha Bal, DO;  [facility];  Service: Endoscopy;;   right ankle surgery     SAVORY DILATION N/A 07/11/2018   Procedure: SAVORY DILATION;  Surgeon: Natasha Bali, MD;  [facility];  Service: Endoscopy;  Laterality: N/A;   SHOULDER SURGERY     TONSILLECTOMY  1980   TUBAL LIGATION      Current Medications: Current Meds  Medication Sig   acetaminophen (TYLENOL) 650 MG CR tablet Take 1,300 mg by mouth 2 (two) times daily as needed for pain.   atenolol (TENORMIN) 25 MG tablet Take 1 tablet (25 mg total) by mouth daily.   azelastine (ASTELIN) 0.1 % nasal spray as needed for allergies.   budesonide (RHINOCORT AQUA) 32 MCG/ACT nasal spray as needed for allergies.   citalopram (CELEXA) 20 MG tablet Take 1 tablet (20 mg total) by mouth 2 (two) times daily.   colestipol (COLESTID) 1 g tablet Two tablets once daily, do not take within 2 hours of other medications.   dicyclomine (BENTYL) 20 MG tablet Take 20 mg by mouth 4 (four) times daily as needed for spasms.   estradiol (ESTRACE) 0.5 MG tablet Take 0.5 mg by mouth at bedtime.    fluticasone (FLONASE) 50 MCG/ACT nasal spray Place 1 spray into both nostrils 2 (two) times daily.   ipratropium (ATROVENT) 0.06 % nasal spray Place 2 sprays into both nostrils daily.   mirtazapine (REMERON) 15 MG tablet Take 15 mg by mouth at bedtime.   Multiple Vitamin (MULTIVITAMIN WITH MINERALS) TABS tablet Take 1 tablet by mouth daily.   pantoprazole (PROTONIX) 40 MG tablet TAKE 1 TABLET BY MOUTH 30 MINUTES PRIOR TO MEALS TWICE DAILY   rosuvastatin (CRESTOR) 10 MG tablet Take 10 mg by mouth at bedtime.   spironolactone (ALDACTONE) 25 MG tablet Take 0.5 tablets (12.5 mg total) by mouth daily.   [DISCONTINUED] metoprolol tartrate (LOPRESSOR) 100 MG tablet Take 2 hours prior to Cardiac CT     Allergies:   Penicillins, Sulfamethoxazole-trimethoprim, Codeine, Fentanyl, Morphine, Adhesive [tape], Influenza vaccines,  Latex, and Nsaids   Social History   Socioeconomic History   Marital status: Married    Spouse name: Natasha Wright   Number of children: 1   Years of education: 14   Highest education level: Not on file  Occupational History   Occupation: Unemployed    Employer: [employer]    Employer: UNEMPLOYED  Tobacco Use   Smoking status: Former    Current packs/day: 0.00    Average packs/day: 0.5 packs/day for 40.0 years (20.0 ttl pk-yrs)    Types: Cigarettes    Start date: 12/24/1973    Quit date: 12/24/2013    Years since quitting: 9.5   Smokeless tobacco: Never   Tobacco comments:    using vape as aid to sustain from smoking  Vaping Use   Vaping status:  Never Used  Substance and Sexual Activity   Alcohol use: No   Drug use: No   Sexual activity: Not on file  Other Topics Concern   Not on file  Social History Narrative   One son, Down syndrome,is right handed ,resides with son and husband   Right handed   Social Determinants of Health   Financial Resource Strain: Not on file  Food Insecurity: Not on file  Transportation Needs: Not on file  Physical Activity: Not on file  Stress: Not on file  Social Connections: Not on file     Family History: The patient's family history includes Asthma in her maternal grandmother; Breast cancer in her maternal grandmother; Diabetes in her maternal grandmother, mother, paternal grandmother, and sister; Down syndrome in her child; Heart disease in her child, paternal grandfather, and paternal grandmother; Hyperlipidemia in her brother, mother, and sister; Hypertension in her maternal grandmother, mother, and sister; Stroke in her maternal grandfather, maternal grandmother, and paternal grandmother; Ulcerative colitis in her father. There is no history of Colon cancer.  ROS:   Please see the history of present illness.     EKGs/Labs/Other Studies Reviewed:    The following studies were reviewed today: 01/14/23: SR rate 90  Cardiac  Studies & Procedures     STRESS TESTS  MYOCARDIAL PERFUSION IMAGING 06/05/2020  Narrative  There was no ST segment deviation noted during stress.  Nuclear stress EF: 53%. The left ventricular ejection fraction is normal (55-65%). Visually, the EF is higher than the computer calculated 53%.  This is a low risk study. There is no evidence of ischemia or previous infarction .  The study is normal.   ECHOCARDIOGRAM  ECHOCARDIOGRAM COMPLETE 03/29/2022  Narrative ECHOCARDIOGRAM REPORT    Patient Name:   TASHAWN GRESSEL Whitis Date of Exam: 03/29/2022 Medical Rec #:  [id number]        Height:       67.5 in Accession #:    [id number]       Weight:       191.0 lb Date of Birth:  11/26/1960       BSA:          1.994 m Patient Age:    60 years         BP:           133/82 mmHg Patient Gender: F                HR:           87 bpm. Ex[facility]  Procedure: 2D Echo, Cardiac Doppler and Color Doppler  Indications:    PVC's  History:        Patient has prior history of Echocardiogram examinations, most recent 04/11/2017. Arrythmias:PVC, Signs/Symptoms:Fatigue, Chest Pain and Dyspnea; Risk Factors:Former Smoker.  Sonographer:    Mikki Harbor Referring Phys: [id number] Wendall Stade  IMPRESSIONS   1. Left ventricular ejection fraction, by estimation, is 60 to 65%. The left ventricle has normal function. The left ventricle has no regional wall motion abnormalities. Left ventricular diastolic parameters were normal. 2. Right ventricular systolic function is normal. The right ventricular size is normal. There is normal pulmonary artery systolic pressure. The estimated right ventricular systolic pressure is 21.3 mmHg. 3. The mitral valve is grossly normal. Trivial mitral valve regurgitation. No evidence of mitral stenosis. 4. The aortic valve is tricuspid. Aortic valve regurgitation is not visualized. No aortic stenosis is present. 5. The inferior vena cava is  normal in size with  greater than 50% respiratory variability, suggesting right atrial pressure of 3 mmHg.  FINDINGS Left Ventricle: Left ventricular ejection fraction, by estimation, is 60 to 65%. The left ventricle has normal function. The left ventricle has no regional wall motion abnormalities. The left ventricular internal cavity size was normal in size. There is no left ventricular hypertrophy. Left ventricular diastolic parameters were normal.  Right Ventricle: The right ventricular size is normal. No increase in right ventricular wall thickness. Right ventricular systolic function is normal. There is normal pulmonary artery systolic pressure. The tricuspid regurgitant velocity is 2.14 m/s, and with an assumed right atrial pressure of 3 mmHg, the estimated right ventricular systolic pressure is 21.3 mmHg.  Left Atrium: Left atrial size was normal in size.  Right Atrium: Right atrial size was normal in size.  Pericardium: There is no evidence of pericardial effusion.  Mitral Valve: The mitral valve is grossly normal. Trivial mitral valve regurgitation. No evidence of mitral valve stenosis. MV peak gradient, 5.2 mmHg. The mean mitral valve gradient is 2.0 mmHg.  Tricuspid Valve: The tricuspid valve is grossly normal. Tricuspid valve regurgitation is trivial. No evidence of tricuspid stenosis.  Aortic Valve: The aortic valve is tricuspid. Aortic valve regurgitation is not visualized. No aortic stenosis is present. Aortic valve mean gradient measures 3.0 mmHg. Aortic valve peak gradient measures 4.7 mmHg. Aortic valve area, by VTI measures 2.53 cm.  Pulmonic Valve: The pulmonic valve was grossly normal. Pulmonic valve regurgitation is not visualized. No evidence of pulmonic stenosis.  Aorta: The aortic root and ascending aorta are structurally normal, with no evidence of dilitation.  Venous: The right lower pulmonary vein is normal. The inferior vena cava is normal in size with greater than 50% respiratory  variability, suggesting right atrial pressure of 3 mmHg.  IAS/Shunts: The atrial septum is grossly normal.   LEFT VENTRICLE PLAX 2D LVIDd:         3.90 cm     Diastology LVIDs:         2.90 cm     LV e' medial:    9.36 cm/s LV PW:         1.10 cm     LV E/e' medial:  7.6 LV IVS:        1.20 cm     LV e' lateral:   8.16 cm/s LVOT diam:     2.00 cm     LV E/e' lateral: 8.7 LV SV:         62 LV SV Index:   31 LVOT Area:     3.14 cm  LV Volumes (MOD) LV vol d, MOD A2C: 64.5 ml LV vol d, MOD A4C: 53.5 ml LV vol s, MOD A2C: 22.4 ml LV vol s, MOD A4C: 23.8 ml LV SV MOD A2C:     42.1 ml LV SV MOD A4C:     53.5 ml LV SV MOD BP:      35.2 ml  RIGHT VENTRICLE RV Basal diam:  3.15 cm RV Mid diam:    2.80 cm TAPSE (M-mode): 2.8 cm  LEFT ATRIUM           Index        RIGHT ATRIUM           Index LA diam:      3.80 cm 1.91 cm/m   RA Area:     13.90 cm LA Vol (A2C): 60.2 ml 30.19 ml/m  RA Volume:   33.00  ml  16.55 ml/m LA Vol (A4C): 31.9 ml 16.00 ml/m AORTIC VALVE                    PULMONIC VALVE AV Area (Vmax):    2.39 cm     PV Vmax:       0.61 m/s AV Area (Vmean):   2.33 cm     PV Peak grad:  1.5 mmHg AV Area (VTI):     2.53 cm AV Vmax:           108.00 cm/s AV Vmean:          74.000 cm/s AV VTI:            0.246 m AV Peak Grad:      4.7 mmHg AV Mean Grad:      3.0 mmHg LVOT Vmax:         82.20 cm/s LVOT Vmean:        54.900 cm/s LVOT VTI:          0.198 m LVOT/AV VTI ratio: 0.80  AORTA Ao Root diam: 3.00 cm Ao Asc diam:  2.70 cm  MITRAL VALVE               TRICUSPID VALVE MV Area (PHT): 4.21 cm    TR Peak grad:   18.3 mmHg MV Area VTI:   2.79 cm    TR Vmax:        214.00 cm/s MV Peak grad:  5.2 mmHg MV Mean grad:  2.0 mmHg    SHUNTS MV Vmax:       1.14 m/s    Systemic VTI:  0.20 m MV Vmean:      71.1 cm/s   Systemic Diam: 2.00 cm MV Decel Time: 180 msec MV E velocity: 70.70 cm/s MV A velocity: 96.80 cm/s MV E/A ratio:  0.73  Lennie Odor  MD Electronically signed by Lennie Odor MD Signature Date/Time: 03/29/2022/11:31:48 AM    Final    MONITORS  CARDIAC EVENT MONITOR 04/13/2022   CT SCANS  CT CORONARY MORPH W/CTA COR W/SCORE 09/30/2022  Addendum 09/30/2022  2:29 PM ADDENDUM REPORT: 09/30/2022 14:27  EXAM: OVER-READ INTERPRETATION  CT CHEST  The following report is an over-read performed by radiologist Dr. Roque Lias [facility] on 09/30/2022. This over-read does not include interpretation of cardiac or coronary anatomy or pathology. The coronary CTA interpretation by the cardiologist is attached.  COMPARISON:  None.  FINDINGS: The visualized portions of the extracardiac vascular structures are unremarkable. Visualized mediastinum is unremarkable. Visualized portion of upper abdomen is unremarkable. Visualized pulmonary parenchyma is unremarkable. Visualized skeleton is unremarkable.  IMPRESSION: No definite abnormality seen involving the visualized extracardiac structures of the chest.   Electronically Signed By: Lupita Raider M.D. On: 09/30/2022 14:27  Narrative CLINICAL DATA:  62 yo female with chest pain  EXAM: Cardiac/Coronary CTA  TECHNIQUE: A non-contrast, gated CT scan was obtained with axial slices of 3 mm through the heart for calcium scoring. Calcium scoring was performed using the Agatston method. A 120 kV prospective, gated, contrast cardiac scan was obtained. Gantry rotation speed was 250 msecs and collimation was 0.6 mm. Two sublingual nitroglycerin tablets (0.8 mg) were given. The 3D data set was reconstructed in 5% intervals of the 35-75% of the R-R cycle. Diastolic phases were analyzed on a dedicated workstation using MPR, MIP, and VRT modes. The patient received 95 cc of contrast.  FINDINGS: Image quality: Excellent.  Noise artifact  is: Limited.  Coronary Arteries:  Normal coronary origin.  Right dominance.  Left main: The left main is a large  caliber vessel with a normal take off from the left coronary cusp that bifurcates to form a left anterior descending artery and a left circumflex artery. There is no plaque or stenosis.  Left anterior descending artery: The LAD is patent without evidence of plaque or stenosis. The LAD gives off 1 patent diagonal branch.  Left circumflex artery: The LCX is non-dominant and patent with no evidence of plaque or stenosis. The LCX gives off 1 patent obtuse marginal branch.  Right coronary artery: The RCA is dominant with normal take off from the right coronary cusp. There is no evidence of plaque or stenosis. The RCA terminates as a PDA and right posterolateral branch without evidence of plaque or stenosis.  Right Atrium: Right atrial size is within normal limits.  Right Ventricle: The right ventricular cavity is within normal limits.  Left Atrium: Left atrial size is normal in size with no left atrial appendage filling defect.  Left Ventricle: The ventricular cavity size is within normal limits. There are no stigmata of prior infarction. There is no abnormal filling defect.  Pulmonary arteries: Normal in size without proximal filling defect.  Pulmonary veins: Normal pulmonary venous drainage.  Pericardium: Normal thickness with no significant effusion or calcium present.  Cardiac valves: The aortic valve is trileaflet without significant calcification. The mitral valve is normal structure without significant calcification.  Aorta: Normal caliber with no significant disease.  Extra-cardiac findings: See attached radiology report for non-cardiac structures.  IMPRESSION: 1. Coronary calcium score of 0. This was 0 percentile for age-, sex, and race-matched controls.  2. Normal coronary origin with right dominance.  3. No evidence of CAD.  RECOMMENDATIONS: 1 CAD-RADS 0: No evidence of CAD (0%). Consider non-atherosclerotic causes of chest pain.  Olga Millers,  MD  Electronically Signed: By: Olga Millers M.D. On: 09/30/2022 14:12         Recent Labs: 09/17/2022: TSH 1.310 01/28/2023: NT-Pro BNP 149 06/29/2023: BUN 10; Creatinine, Ser 0.89; Potassium 4.1; Sodium 137  Recent Lipid Panel    Component Value Date/Time   CHOL 195 06/11/2009 2347   TRIG 238 (H) 06/11/2009 2347   HDL 60 06/11/2009 2347   CHOLHDL 3.3 Ratio 06/11/2009 2347   VLDL 48 (H) 06/11/2009 2347   LDLCALC 87 06/11/2009 2347        Physical Exam:    VS:  BP 100/66   Pulse 82   Ht 5\' 8"  (1.727 m)   Wt 200 lb 9.6 oz (91 kg)   SpO2 94%   BMI 30.50 kg/m     Wt Readings from Last 3 Encounters:  07/28/23 200 lb 9.6 oz (91 kg)  07/04/23 204 lb (92.5 kg)  06/29/23 204 lb (92.5 kg)    GEN:  Well nourished, well developed in no acute distress HEENT: Normal NECK: No JVD CARDIAC: RRR, no murmurs, rubs, gallops RESPIRATORY:  normal rate, crackles in the bases ABDOMEN: Soft, non-tender, non-distended MUSCULOSKELETAL: non pitting bilateral edema; No deformity  NEUROLOGIC:  Alert and oriented x 3 PSYCHIATRIC:  anxious affect   ASSESSMENT:    1. Chronic heart failure with preserved ejection fraction (HCC)   2. LVH (left ventricular hypertrophy)   3. PVC's (premature ventricular contractions)   4. Essential hypertension     PLAN:    Chronic HFpEF Chronic lymphedema s/p venous ablations HTN - continue compression stockings - spironolactone 25 mg  PO daily - BMP and BNP today - based on labs will do PRN lasix vs lasix 20 mg PO daily - if worsening add SGLT2i (has hx of hypoglycemia)  PVCs - no LV dysfunction - continue current BB  One year        Medication Adjustments/Labs and Tests Ordered: Current medicines are reviewed at length with the patient today.  Concerns regarding medicines are outlined above.  Orders Placed This Encounter  Procedures   EKG 12-Lead   No orders of the defined types were placed in this encounter.   There are no Patient  Instructions on file for this visit.   Signed, Natasha Constant, MD  07/28/2023 8:57 AM    [facility]

## 2023-07-28 NOTE — Patient Instructions (Signed)
Medication Instructions:  Your physician has recommended you make the following change in your medication:  UPDATED MED LIST: spironolactone 25 mg by mouth once daily  *If you need a refill on your cardiac medications before your next appointment, please call your pharmacy*   Lab Work: BMP, BNP  If you have labs (blood work) drawn today and your tests are completely normal, you will receive your results only by: MyChart Message (if you have MyChart) OR A paper copy in the mail If you have any lab test that is abnormal or we need to change your treatment, we will call you to review the results.   Testing/Procedures: NONE   Follow-Up: At [facility], you and your health needs are our priority.  As part of our continuing mission to provide you with exceptional heart care, we have created designated Provider Care Teams.  These Care Teams include your primary Cardiologist (physician) and Advanced Practice Providers (APPs -  Physician Assistants and Nurse Practitioners) who all work together to provide you with the care you need, when you need it.   Your next appointment:   1 year(s)  Provider:   Christell Constant, MD

## 2023-07-30 LAB — BASIC METABOLIC PANEL
BUN/Creatinine Ratio: 13 (ref 12–28)
BUN: 11 mg/dL (ref 8–27)
CO2: 21 mmol/L (ref 20–29)
Calcium: 9 mg/dL (ref 8.7–10.3)
Chloride: 104 mmol/L (ref 96–106)
Creatinine, Ser: 0.84 mg/dL (ref 0.57–1.00)
Glucose: 102 mg/dL — ABNORMAL HIGH (ref 70–99)
Potassium: 4.2 mmol/L (ref 3.5–5.2)
Sodium: 140 mmol/L (ref 134–144)
eGFR: 78 mL/min/{1.73_m2} (ref >59)

## 2023-07-30 LAB — PRO B NATRIURETIC PEPTIDE: NT-Pro BNP: 124 pg/mL (ref 0–287)

## 2023-08-13 ENCOUNTER — Other Ambulatory Visit: Payer: Self-pay | Admitting: Gastroenterology

## 2023-08-15 ENCOUNTER — Other Ambulatory Visit: Payer: Self-pay | Admitting: Gastroenterology

## 2023-08-16 ENCOUNTER — Encounter (HOSPITAL_COMMUNITY): Payer: Self-pay | Admitting: Internal Medicine

## 2023-09-19 ENCOUNTER — Encounter: Payer: Self-pay | Admitting: Gastroenterology

## 2023-10-06 ENCOUNTER — Other Ambulatory Visit (HOSPITAL_COMMUNITY): Payer: Self-pay | Admitting: Internal Medicine

## 2023-10-06 ENCOUNTER — Ambulatory Visit (HOSPITAL_COMMUNITY)
Admission: RE | Admit: 2023-10-06 | Discharge: 2023-10-06 | Disposition: A | Discharge status: Home / Self Care | Payer: Medicare Other | Source: Ambulatory Visit | Attending: Internal Medicine | Admitting: Internal Medicine

## 2023-10-06 DIAGNOSIS — E559 Vitamin D deficiency, unspecified: Secondary | ICD-10-CM | POA: Diagnosis not present

## 2023-10-06 DIAGNOSIS — R0600 Dyspnea, unspecified: Secondary | ICD-10-CM | POA: Diagnosis not present

## 2023-10-06 DIAGNOSIS — G9332 Myalgic encephalomyelitis/chronic fatigue syndrome: Secondary | ICD-10-CM | POA: Diagnosis not present

## 2023-10-06 DIAGNOSIS — R1310 Dysphagia, unspecified: Secondary | ICD-10-CM | POA: Diagnosis not present

## 2023-10-06 DIAGNOSIS — K219 Gastro-esophageal reflux disease without esophagitis: Secondary | ICD-10-CM | POA: Diagnosis not present

## 2023-10-06 DIAGNOSIS — M1991 Primary osteoarthritis, unspecified site: Secondary | ICD-10-CM | POA: Diagnosis not present

## 2023-10-06 DIAGNOSIS — Z0001 Encounter for general adult medical examination with abnormal findings: Secondary | ICD-10-CM | POA: Diagnosis not present

## 2023-10-06 DIAGNOSIS — D518 Other vitamin B12 deficiency anemias: Secondary | ICD-10-CM | POA: Diagnosis not present

## 2023-10-06 DIAGNOSIS — G894 Chronic pain syndrome: Secondary | ICD-10-CM | POA: Diagnosis not present

## 2023-10-06 DIAGNOSIS — I872 Venous insufficiency (chronic) (peripheral): Secondary | ICD-10-CM | POA: Diagnosis not present

## 2023-10-06 DIAGNOSIS — E782 Mixed hyperlipidemia: Secondary | ICD-10-CM | POA: Diagnosis not present

## 2023-10-13 DIAGNOSIS — J209 Acute bronchitis, unspecified: Secondary | ICD-10-CM | POA: Diagnosis not present

## 2023-10-13 DIAGNOSIS — M1990 Unspecified osteoarthritis, unspecified site: Secondary | ICD-10-CM | POA: Diagnosis not present

## 2023-10-13 DIAGNOSIS — J0100 Acute maxillary sinusitis, unspecified: Secondary | ICD-10-CM | POA: Diagnosis not present

## 2023-10-13 DIAGNOSIS — N3281 Overactive bladder: Secondary | ICD-10-CM | POA: Diagnosis not present

## 2023-10-14 ENCOUNTER — Other Ambulatory Visit: Payer: Self-pay | Admitting: Internal Medicine

## 2023-10-14 DIAGNOSIS — R208 Other disturbances of skin sensation: Secondary | ICD-10-CM

## 2023-10-15 ENCOUNTER — Other Ambulatory Visit: Payer: Self-pay | Admitting: Internal Medicine

## 2023-10-17 ENCOUNTER — Ambulatory Visit: Payer: Medicare Other | Admitting: Gastroenterology

## 2023-10-17 NOTE — Progress Notes (Deleted)
GI Office Note    Referring Provider: Elfredia Nevins, MD Primary Care Physician:  Elfredia Nevins, MD  Primary Gastroenterologist: Hennie Duos. Marletta Lor, DO   Chief Complaint   No chief complaint on file.   History of Present Illness   Natasha Wright is a 62 y.o. female presenting today for ofllow up. Last seen*** H/o abdominal pain, diarrhea, bloating, gas, IBS, diverticulitis.      After last ov, started colestid, continued dicyclomine and pantoprazole. Colonoscopy completed as outlined below.  CT abdomen pelvis with contrast January 2023: Impression: No acute abnormality in the abdomen or pelvis.  Specifically no evidence of acute bowel inflammation or diverticulitis.   Colonoscopy 06/2023: -diverticulosis -one 8mm polyp in ascending colon removed, tubular adenomas -one 5mm polyp in rectum removed, hyperplastic -random colon biopsies negative -next colonoscopy five years  EGD 06/2018: -esophagus normal s/p dilation, patient responded to dilation -mild gastritis, no h.pylori -duodenal bx negative.   Colonoscopy 04/2013: -mild diverticulosis -small internal hemorrhoids      Medications   Current Outpatient Medications  Medication Sig Dispense Refill   acetaminophen (TYLENOL) 650 MG CR tablet Take 1,300 mg by mouth 2 (two) times daily as needed for pain.     atenolol (TENORMIN) 25 MG tablet Take 1 tablet (25 mg total) by mouth daily. 90 tablet 2   azelastine (ASTELIN) 0.1 % nasal spray as needed for allergies.     budesonide (RHINOCORT AQUA) 32 MCG/ACT nasal spray as needed for allergies.     citalopram (CELEXA) 20 MG tablet Take 1 tablet (20 mg total) by mouth 2 (two) times daily. 180 tablet 3   colestipol (COLESTID) 1 g tablet TAKE 2 TABLETS BY MOUTH DAILY. DO NOT TAKE WITHIN 2 HOURS OF OTHER MEDICATIONS 180 tablet 2   dicyclomine (BENTYL) 20 MG tablet Take 20 mg by mouth 4 (four) times daily as needed for spasms.     estradiol (ESTRACE) 0.5 MG tablet Take  0.5 mg by mouth at bedtime.      fluticasone (FLONASE) 50 MCG/ACT nasal spray Place 1 spray into both nostrils 2 (two) times daily.     ipratropium (ATROVENT) 0.06 % nasal spray Place 2 sprays into both nostrils daily.     mirtazapine (REMERON) 15 MG tablet Take 15 mg by mouth at bedtime.     Multiple Vitamin (MULTIVITAMIN WITH MINERALS) TABS tablet Take 1 tablet by mouth daily.     pantoprazole (PROTONIX) 40 MG tablet TAKE 1 TABLET BY MOUTH 30 MINUTES PRIOR TO MEALS TWICE DAILY 60 tablet 11   rosuvastatin (CRESTOR) 10 MG tablet Take 10 mg by mouth at bedtime.     spironolactone (ALDACTONE) 25 MG tablet Take 1 tablet (25 mg total) by mouth daily. 90 tablet 3   No current facility-administered medications for this visit.    Allergies   Allergies as of 10/17/2023 - Review Complete 07/28/2023  Allergen Reaction Noted   Penicillins Shortness Of Breath and Rash    Sulfamethoxazole-trimethoprim Shortness Of Breath and Rash    Codeine Nausea Only and Other (See Comments)    Fentanyl Nausea Only and Other (See Comments)    Morphine Nausea Only and Other (See Comments)    Adhesive [tape] Other (See Comments) 06/29/2018   Influenza vaccines Rash 03/06/2014   Latex Rash 06/29/2018   Nsaids Nausea Only 04/23/2013     Past Medical History   Past Medical History:  Diagnosis Date   Anxiety    Arthritis    B12 deficiency  h/o   Family history of heart disease    GERD (gastroesophageal reflux disease)    Hypoglycemia    IBS (irritable bowel syndrome)    Lumbar disc disease    OA (osteoarthritis)    PONV (postoperative nausea and vomiting)    needs scop patch   Rectocele    Shingles    Spinal stenosis in cervical region     Past Surgical History   Past Surgical History:  Procedure Laterality Date   ABDOMINAL HYSTERECTOMY  1992   ARTHRODESIS FOOT WITH WEIL OSTEOTOMY Left 05/09/2014   Procedure: LEFT FIRST TARSAL METATARSAL  ARTHRODESIS; LEFT SECOND WEIL OSTEOTOMY;  Surgeon: Toni Arthurs, MD;  Location: [facility];  Service: Orthopedics;  Laterality: Left;   BIOPSY  07/11/2018   Procedure: BIOPSY;  Surgeon: West Bali, MD;  Location: [facility];  Service: Endoscopy;;  duodenal biopsy , gastric biopsy    BIOPSY  07/04/2023   Procedure: BIOPSY;  Surgeon: Lanelle Bal, DO;  Location: [facility];  Service: Endoscopy;;   BUNIONECTOMY WITH HAMMERTOE RECONSTRUCTION Left 05/09/2014   Procedure: LEFT MODIFIED MCBRIDE BUNIONECTOMY; LEFT SECOND HAMMER TOE CORRECTION;  Surgeon: Toni Arthurs, MD;  Location: [facility];  Service: Orthopedics;  Laterality: Left;   CERVICAL SPINE SURGERY  2009   CHOLECYSTECTOMY  2004   lap choli   COLONOSCOPY  07/08/2003   RMR: Left-sided diverticula, remainder of colonic mucosa and terminal ileum appeared normal.. Minimally friable internal hemorrhoids, otherwise normal rectum   COLONOSCOPY N/A 04/30/2013   Dr. Darrick Penna: mild diverticulosis in descending and sigmoid colon, internal hemorrhoids.    COLONOSCOPY WITH PROPOFOL N/A 07/04/2023   Procedure: COLONOSCOPY WITH PROPOFOL;  Surgeon: Lanelle Bal, DO;  Location: [facility];  Service: Endoscopy;  Laterality: N/A;  10:45 AM, ASA 3, pt knows to arrive at 8:00   DILATION AND CURETTAGE OF UTERUS     ESOPHAGOGASTRODUODENOSCOPY (EGD) WITH PROPOFOL N/A 07/11/2018   Procedure: ESOPHAGOGASTRODUODENOSCOPY (EGD) WITH PROPOFOL;  Surgeon: West Bali, MD;  Location: [facility];  Service: Endoscopy;  Laterality: N/A;  9:30am   KNEE ARTHROSCOPY Right    NASAL SINUS SURGERY     NM MYOCAR PERF WALL MOTION  10/2011   bruce myoview - breast attenuation noted in anterior region; EF 65%; no ischemia/infarct/scar; low risk   POLYPECTOMY  07/04/2023   Procedure: POLYPECTOMY;  Surgeon: Lanelle Bal, DO;  Location: [facility];  Service: Endoscopy;;   right ankle surgery     SAVORY DILATION N/A 07/11/2018   Procedure: SAVORY DILATION;  Surgeon: West Bali, MD;  Location: [facility];  Service: Endoscopy;  Laterality: N/A;   SHOULDER SURGERY     TONSILLECTOMY  1980   TUBAL LIGATION      Past Family History   Family History  Problem Relation Age of Onset   Diabetes Mother    Hypertension Mother    Hyperlipidemia Mother    Ulcerative colitis Father    Breast cancer Maternal Grandmother    Hypertension Maternal Grandmother    Diabetes Maternal Grandmother    Stroke Maternal Grandmother    Asthma Maternal Grandmother    Stroke Maternal Grandfather    Stroke Paternal Grandmother    Diabetes Paternal Grandmother    Heart disease Paternal Grandmother    Heart disease Paternal Grandfather    Hyperlipidemia Brother    Hyperlipidemia Sister    Hypertension Sister    Diabetes Sister  Down syndrome Child    Heart disease Child    Colon cancer Neg Hx     Past Social History   Social History   Socioeconomic History   Marital status: Married    Spouse name: Remi Deter   Number of children: 1   Years of education: 14   Highest education level: Not on file  Occupational History   Occupation: Unemployed    Employer: [employer]    Employer: UNEMPLOYED  Tobacco Use   Smoking status: Former    Current packs/day: 0.00    Average packs/day: 0.5 packs/day for 40.0 years (20.0 ttl pk-yrs)    Types: Cigarettes    Start date: 12/24/1973    Quit date: 12/24/2013    Years since quitting: 9.8   Smokeless tobacco: Never   Tobacco comments:    using vape as aid to sustain from smoking  Vaping Use   Vaping status: Never Used  Substance and Sexual Activity   Alcohol use: No   Drug use: No   Sexual activity: Not on file  Other Topics Concern   Not on file  Social History Narrative   One son, Down syndrome,is right handed ,resides with son and husband   Right handed   Social Determinants of Health   Financial Resource Strain: Not on file  Food Insecurity: Not on file  Transportation Needs: Not on file  Physical  Activity: Not on file  Stress: Not on file  Social Connections: Not on file  Intimate Partner Violence: Not on file    Review of Systems   General: Negative for anorexia, weight loss, fever, chills, fatigue, weakness. ENT: Negative for hoarseness, difficulty swallowing , nasal congestion. CV: Negative for chest pain, angina, palpitations, dyspnea on exertion, peripheral edema.  Respiratory: Negative for dyspnea at rest, dyspnea on exertion, cough, sputum, wheezing.  GI: See history of present illness. GU:  Negative for dysuria, hematuria, urinary incontinence, urinary frequency, nocturnal urination.  Endo: Negative for unusual weight change.     Physical Exam   There were no vitals taken for this visit.   General: Well-nourished, well-developed in no acute distress.  Eyes: No icterus. Mouth: Oropharyngeal mucosa moist and pink , no lesions erythema or exudate. Lungs: Clear to auscultation bilaterally.  Heart: Regular rate and rhythm, no murmurs rubs or gallops.  Abdomen: Bowel sounds are normal, nontender, nondistended, no hepatosplenomegaly or masses,  no abdominal bruits or hernia , no rebound or guarding.  Rectal: ***  Extremities: No lower extremity edema. No clubbing or deformities. Neuro: Alert and oriented x 4   Skin: Warm and dry, no jaundice.   Psych: Alert and cooperative, normal mood and affect.  Labs   *** Imaging Studies   DG Chest 2 View  Result Date: 10/13/2023 CLINICAL DATA:  Dyspnea. EXAM: CHEST - 2 VIEW COMPARISON:  03/09/2023 FINDINGS: Streaky opacity at the lung bases is compatible with atelectasis or scarring. No focal consolidation, pulmonary edema, or pleural effusion. The cardiopericardial silhouette is within normal limits for size. No acute bony abnormality. IMPRESSION: Bibasilar atelectasis or scarring. Electronically Signed   By: Kennith Center M.D.   On: 10/13/2023 09:08    Assessment       PLAN   ***   Leanna Battles. Melvyn Neth, MHS,  PA-C [facility]

## 2023-10-24 ENCOUNTER — Encounter: Payer: Self-pay | Admitting: Gastroenterology

## 2023-10-24 ENCOUNTER — Ambulatory Visit: Payer: Medicare Other | Admitting: Gastroenterology

## 2023-10-24 VITALS — BP 121/83 | HR 76 | Temp 98.0°F | Ht 67.0 in | Wt 200.8 lb

## 2023-10-24 DIAGNOSIS — R1310 Dysphagia, unspecified: Secondary | ICD-10-CM

## 2023-10-24 DIAGNOSIS — K219 Gastro-esophageal reflux disease without esophagitis: Secondary | ICD-10-CM

## 2023-10-24 DIAGNOSIS — R197 Diarrhea, unspecified: Secondary | ICD-10-CM | POA: Diagnosis not present

## 2023-10-24 MED ORDER — COLESTIPOL HCL 5 G PO PACK: PACK | ORAL | 3 refills | Status: DC

## 2023-10-24 NOTE — Patient Instructions (Signed)
I have sent in powder Colestid. Take 1/2 packet (2.5 grams) ONCE daily, do not take within two hours of other medications.  Let me know if pricing is not affordable and we can try the other powder options mentioned today. Continue pantoprazole 40mg  twice daily before a meal.  Return to office in four months. Call sooner if you need anything!

## 2023-10-24 NOTE — Progress Notes (Signed)
GI Office Note    Referring Provider: Elfredia Nevins, MD Primary Care Physician:  Elfredia Nevins, MD  Primary Gastroenterologist: Hennie Duos. Marletta Lor, DO   Chief Complaint   Chief Complaint  Patient presents with   Follow-up    Doing well    History of Present Illness   Natasha Wright is a 62 y.o. female presenting today for follow up. Last seen in the office in 05/2023. H/o abdominal pain, diarrhea, bloating, gas, IBS, diverticulitis.      After last ov, started colestid, continued dicyclomine prn and pantoprazole once or twice daily. Colonoscopy completed as outlined below.  She continues to have a lot of struggles. Recently her mother was placed on Hospice for advanced Alzheimer's. She continues to care for her son with Down's. She had noted issues with muscle weakness of both upper and lower extremities since having Covid in 10/2022. She has issues swallowing, feels like its muscular rather than obstructive. Has difficult swallowing meats and her colestid. States it takes 30 minutes to swallow colestid (after breaking into four pieces). Sometimes will have food come back up if having trouble swallowing. Feels like it never gets much past the back of her throat. States she is being referred to a neurologist for work up. Her PCP has ordered MRI brain. She has also been having DOE and recent chest xray with bibasilar atelectasis vs scarring. Continues to struggle with lymphedema.     From a GI standpoint, her diarrhea resolved with colestid, she was taking 1 gram twice a day. Using [facility] to afford. She worries about price moving into 2025. She would like to see if powder is affordable. She denies constipation. BM once daily. Has been very happy with response. Does watch greasy foods, gets sick on the stomach if has too much grease. Has had great control of her reflux on pantoprazole BID. No longer requiring Pepcid.       CT abdomen pelvis with contrast January  2023: Impression: No acute abnormality in the abdomen or pelvis.  Specifically no evidence of acute bowel inflammation or diverticulitis.   Colonoscopy 06/2023: -diverticulosis -one 8mm polyp in ascending colon removed, tubular adenomas -one 5mm polyp in rectum removed, hyperplastic -random colon biopsies negative -next colonoscopy five years   EGD 06/2018: -esophagus normal s/p dilation, patient responded to dilation -mild gastritis, no h.pylori -duodenal bx negative.   Colonoscopy 04/2013: -mild diverticulosis -small internal hemorrhoids    Medications   Current Outpatient Medications  Medication Sig Dispense Refill   acetaminophen (TYLENOL) 650 MG CR tablet Take 1,300 mg by mouth 2 (two) times daily as needed for pain.     atenolol (TENORMIN) 25 MG tablet TAKE 1 TABLET BY MOUTH DAILY 90 tablet 2   azelastine (ASTELIN) 0.1 % nasal spray as needed for allergies.     budesonide (RHINOCORT AQUA) 32 MCG/ACT nasal spray as needed for allergies.     citalopram (CELEXA) 20 MG tablet Take 1 tablet (20 mg total) by mouth 2 (two) times daily. 180 tablet 3   colestipol (COLESTID) 1 g tablet TAKE 2 TABLETS BY MOUTH DAILY. DO NOT TAKE WITHIN 2 HOURS OF OTHER MEDICATIONS 180 tablet 2   dicyclomine (BENTYL) 20 MG tablet Take 20 mg by mouth 4 (four) times daily as needed for spasms.     estradiol (ESTRACE) 0.5 MG tablet Take 0.5 mg by mouth at bedtime.      fluticasone (FLONASE) 50 MCG/ACT nasal spray Place 1 spray into both nostrils  2 (two) times daily.     furosemide (LASIX) 20 MG tablet Take 20 mg by mouth daily.     ipratropium (ATROVENT) 0.06 % nasal spray Place 2 sprays into both nostrils daily.     mirtazapine (REMERON) 15 MG tablet Take 15 mg by mouth at bedtime.     Multiple Vitamin (MULTIVITAMIN WITH MINERALS) TABS tablet Take 1 tablet by mouth daily.     mupirocin ointment (BACTROBAN) 2 % Apply topically.     oxybutynin (DITROPAN) 5 MG tablet Take 5 mg by mouth 2 (two) times daily.      pantoprazole (PROTONIX) 40 MG tablet TAKE 1 TABLET BY MOUTH 30 MINUTES PRIOR TO MEALS TWICE DAILY 60 tablet 11   rosuvastatin (CRESTOR) 10 MG tablet Take 10 mg by mouth at bedtime.     spironolactone (ALDACTONE) 25 MG tablet Take 1 tablet (25 mg total) by mouth daily. 90 tablet 3   No current facility-administered medications for this visit.    Allergies   Allergies as of 10/24/2023 - Review Complete 10/24/2023  Allergen Reaction Noted   Penicillins Shortness Of Breath and Rash    Sulfamethoxazole-trimethoprim Shortness Of Breath and Rash    Codeine Nausea Only and Other (See Comments)    Fentanyl Nausea Only and Other (See Comments)    Morphine Nausea Only and Other (See Comments)    Adhesive [tape] Other (See Comments) 06/29/2018   Influenza vaccines Rash 03/06/2014   Latex Rash 06/29/2018   Nsaids Nausea Only 04/23/2013        Review of Systems   General: Negative for anorexia, weight loss, fever, chills, fatigue, weakness. ENT: Negative for hoarseness, nasal congestion. See hpi. CV: Negative for chest pain, angina, palpitations, dyspnea on exertion, peripheral edema.  Respiratory: Negative for dyspnea at rest,  cough, sputum, wheezing. +DOE GI: See history of present illness. GU:  Negative for dysuria, hematuria, urinary incontinence, urinary frequency, nocturnal urination.  Endo: Negative for unusual weight change.     Physical Exam   BP 121/83 (BP Location: Left Arm, Patient Position: Sitting, Cuff Size: Large)   Pulse 76   Temp 98 F (36.7 C) (Oral)   Ht 5\' 7"  (1.702 m)   Wt 200 lb 12.8 oz (91.1 kg)   SpO2 97%   BMI 31.45 kg/m    General: Well-nourished, well-developed in no acute distress.  Eyes: No icterus. Mouth: Oropharyngeal mucosa moist and pink   Abdomen: Bowel sounds are normal, nontender, nondistended, no hepatosplenomegaly or masses,  no abdominal bruits or hernia , no rebound or guarding.  Rectal: not performed  Extremities: Trace lower  extremity edema. No clubbing or deformities. Neuro: Alert and oriented x 4   Skin: Warm and dry, no jaundice.   Psych: Alert and cooperative, normal mood and affect.  Labs   Labs from November 2024: White blood cell count 6800, hemoglobin 14.5, platelets 277,000, creatinine 0.9, albumin 4.5, total bilirubin 0.3, alkaline phosphatase 110, AST 20, ALT 17, B12 249, folate 3.9, TSH 2.2, vitamin D 30.6.  Imaging Studies   DG Chest 2 View  Result Date: 10/13/2023 CLINICAL DATA:  Dyspnea. EXAM: CHEST - 2 VIEW COMPARISON:  03/09/2023 FINDINGS: Streaky opacity at the lung bases is compatible with atelectasis or scarring. No focal consolidation, pulmonary edema, or pleural effusion. The cardiopericardial silhouette is within normal limits for size. No acute bony abnormality. IMPRESSION: Bibasilar atelectasis or scarring. Electronically Signed   By: Kennith Center M.D.   On: 10/13/2023 09:08    Assessment/Plan:  Diarrhea -doing very well on colestid 1gram BID. Having a hard time swallowing pill. Would do better with powder if affordable. RX sent to take 2.5 grams once daily. If not affordable, we can try cholestyramine.   GERD -doing well on pantoprazole BID. No longer requiring H2 blocker  Dysphagia -to large pills and meats but she describes difficulty swallowing, no necessarily food obstructing in the esophagus. Cannot rule out possible upper esophageal tightness or oropharyngeal component. With other muscular issues, there could be neurological source for her swallowing issues as well. She has pending MRI Brain and neurology consult. She requests to hold off evaluation of her dysphagia from GI standpoint for now.   Return ov in four months or call sooner if needed.   Leanna Battles. Melvyn Neth, MHS, PA-C [facility]

## 2023-10-26 ENCOUNTER — Other Ambulatory Visit: Payer: Self-pay | Admitting: Internal Medicine

## 2023-10-26 DIAGNOSIS — R9389 Abnormal findings on diagnostic imaging of other specified body structures: Secondary | ICD-10-CM

## 2023-11-09 ENCOUNTER — Other Ambulatory Visit: Payer: Medicare Other

## 2023-11-09 ENCOUNTER — Ambulatory Visit
Admission: RE | Admit: 2023-11-09 | Discharge: 2023-11-09 | Disposition: A | Discharge status: Home / Self Care | Payer: Medicare Other | Source: Ambulatory Visit | Attending: Internal Medicine | Admitting: Internal Medicine

## 2023-11-09 DIAGNOSIS — R599 Enlarged lymph nodes, unspecified: Secondary | ICD-10-CM | POA: Diagnosis not present

## 2023-11-09 DIAGNOSIS — R9389 Abnormal findings on diagnostic imaging of other specified body structures: Secondary | ICD-10-CM

## 2023-11-09 MED ORDER — IOPAMIDOL (ISOVUE-300) INJECTION 61%
75.0000 mL | Freq: Once | INTRAVENOUS | Status: AC | PRN
  Administered 2023-11-09: 75 mL via INTRAVENOUS

## 2023-11-12 ENCOUNTER — Ambulatory Visit
Admission: RE | Admit: 2023-11-12 | Discharge: 2023-11-12 | Disposition: A | Discharge status: Home / Self Care | Payer: Medicare Other | Source: Ambulatory Visit | Attending: Internal Medicine | Admitting: Internal Medicine

## 2023-11-12 DIAGNOSIS — R1310 Dysphagia, unspecified: Secondary | ICD-10-CM | POA: Diagnosis not present

## 2023-11-12 DIAGNOSIS — R29810 Facial weakness: Secondary | ICD-10-CM | POA: Diagnosis not present

## 2023-11-12 DIAGNOSIS — R208 Other disturbances of skin sensation: Secondary | ICD-10-CM

## 2023-11-25 DIAGNOSIS — R4702 Dysphasia: Secondary | ICD-10-CM | POA: Diagnosis not present

## 2023-11-25 DIAGNOSIS — R531 Weakness: Secondary | ICD-10-CM | POA: Diagnosis not present

## 2023-11-25 DIAGNOSIS — E538 Deficiency of other specified B group vitamins: Secondary | ICD-10-CM | POA: Diagnosis not present

## 2023-11-25 DIAGNOSIS — R2681 Unsteadiness on feet: Secondary | ICD-10-CM | POA: Diagnosis not present

## 2023-12-01 DIAGNOSIS — R4702 Dysphasia: Secondary | ICD-10-CM | POA: Diagnosis not present

## 2023-12-06 DIAGNOSIS — R2681 Unsteadiness on feet: Secondary | ICD-10-CM | POA: Diagnosis not present

## 2023-12-14 DIAGNOSIS — E538 Deficiency of other specified B group vitamins: Secondary | ICD-10-CM | POA: Diagnosis not present

## 2023-12-14 DIAGNOSIS — M549 Dorsalgia, unspecified: Secondary | ICD-10-CM | POA: Diagnosis not present

## 2023-12-30 DIAGNOSIS — M47816 Spondylosis without myelopathy or radiculopathy, lumbar region: Secondary | ICD-10-CM | POA: Diagnosis not present

## 2023-12-30 DIAGNOSIS — M47812 Spondylosis without myelopathy or radiculopathy, cervical region: Secondary | ICD-10-CM | POA: Diagnosis not present

## 2023-12-30 DIAGNOSIS — M5412 Radiculopathy, cervical region: Secondary | ICD-10-CM | POA: Diagnosis not present

## 2023-12-30 DIAGNOSIS — M961 Postlaminectomy syndrome, not elsewhere classified: Secondary | ICD-10-CM | POA: Diagnosis not present

## 2023-12-30 DIAGNOSIS — M5416 Radiculopathy, lumbar region: Secondary | ICD-10-CM | POA: Diagnosis not present

## 2023-12-30 DIAGNOSIS — M48061 Spinal stenosis, lumbar region without neurogenic claudication: Secondary | ICD-10-CM | POA: Diagnosis not present

## 2024-01-08 NOTE — Progress Notes (Unsigned)
GI Office Note    Referring Provider: Elfredia Nevins, MD Primary Care Physician:  Elfredia Nevins, MD  Primary Gastroenterologist: Hennie Duos. Marletta Lor, DO   Chief Complaint   No chief complaint on file.   History of Present Illness   Natasha Wright is a 63 y.o. female presenting today for follow up. Last seen 10/2023. H/o abdominal pain, diarrhea, bloating, gas, IBS, diverticulutisi.     Trouble taking colestid due to swallowing issues. Worked well for diarrhea.   CT abdomen pelvis with contrast January 2023: Impression: No acute abnormality in the abdomen or pelvis.  Specifically no evidence of acute bowel inflammation or diverticulitis.   Colonoscopy 06/2023: -diverticulosis -one 8mm polyp in ascending colon removed, tubular adenomas -one 5mm polyp in rectum removed, hyperplastic -random colon biopsies negative -next colonoscopy five years   EGD 06/2018: -esophagus normal s/p dilation, patient responded to dilation -mild gastritis, no h.pylori -duodenal bx negative.   Colonoscopy 04/2013: -mild diverticulosis -small internal hemorrhoids         Medications   Current Outpatient Medications  Medication Sig Dispense Refill   acetaminophen (TYLENOL) 650 MG CR tablet Take 1,300 mg by mouth 2 (two) times daily as needed for pain.     atenolol (TENORMIN) 25 MG tablet TAKE 1 TABLET BY MOUTH DAILY 90 tablet 2   azelastine (ASTELIN) 0.1 % nasal spray as needed for allergies.     budesonide (RHINOCORT AQUA) 32 MCG/ACT nasal spray as needed for allergies.     citalopram (CELEXA) 20 MG tablet Take 1 tablet (20 mg total) by mouth 2 (two) times daily. 180 tablet 3   colestipol (COLESTID) 5 g packet Take 1/2 packet ONCE daily. Do not take within two hours of other medications. 60 each 3   dicyclomine (BENTYL) 20 MG tablet Take 20 mg by mouth 4 (four) times daily as needed for spasms.     estradiol (ESTRACE) 0.5 MG tablet Take 0.5 mg by mouth at bedtime.       fluticasone (FLONASE) 50 MCG/ACT nasal spray Place 1 spray into both nostrils 2 (two) times daily.     furosemide (LASIX) 20 MG tablet Take 20 mg by mouth daily.     ipratropium (ATROVENT) 0.06 % nasal spray Place 2 sprays into both nostrils daily.     mirtazapine (REMERON) 15 MG tablet Take 15 mg by mouth at bedtime.     Multiple Vitamin (MULTIVITAMIN WITH MINERALS) TABS tablet Take 1 tablet by mouth daily.     mupirocin ointment (BACTROBAN) 2 % Apply topically.     oxybutynin (DITROPAN) 5 MG tablet Take 5 mg by mouth 2 (two) times daily.     pantoprazole (PROTONIX) 40 MG tablet TAKE 1 TABLET BY MOUTH 30 MINUTES PRIOR TO MEALS TWICE DAILY 60 tablet 11   rosuvastatin (CRESTOR) 10 MG tablet Take 10 mg by mouth at bedtime.     spironolactone (ALDACTONE) 25 MG tablet Take 1 tablet (25 mg total) by mouth daily. 90 tablet 3   No current facility-administered medications for this visit.    Allergies   Allergies as of 01/09/2024 - Review Complete 10/24/2023  Allergen Reaction Noted   Penicillins Shortness Of Breath and Rash    Sulfamethoxazole-trimethoprim Shortness Of Breath and Rash    Codeine Nausea Only and Other (See Comments)    Fentanyl Nausea Only and Other (See Comments)    Morphine Nausea Only and Other (See Comments)    Adhesive [tape] Other (See Comments) 06/29/2018  Influenza vaccines Rash 03/06/2014   Latex Rash 06/29/2018   Nsaids Nausea Only 04/23/2013     Past Medical History   Past Medical History:  Diagnosis Date   Anxiety    Arthritis    B12 deficiency    h/o   Family history of heart disease    GERD (gastroesophageal reflux disease)    Hypoglycemia    IBS (irritable bowel syndrome)    Lumbar disc disease    OA (osteoarthritis)    PONV (postoperative nausea and vomiting)    needs scop patch   Rectocele    Shingles    Spinal stenosis in cervical region     Past Surgical History   Past Surgical History:  Procedure Laterality Date   ABDOMINAL  HYSTERECTOMY  1992   ARTHRODESIS FOOT WITH WEIL OSTEOTOMY Left 05/09/2014   Procedure: LEFT FIRST TARSAL METATARSAL  ARTHRODESIS; LEFT SECOND WEIL OSTEOTOMY;  Surgeon: Toni Arthurs, MD;  Location: [facility];  Service: Orthopedics;  Laterality: Left;   BIOPSY  07/11/2018   Procedure: BIOPSY;  Surgeon: West Bali, MD;  [facility]: [facility];  Service: Endoscopy;;  duodenal biopsy , gastric biopsy    BIOPSY  07/04/2023   Procedure: BIOPSY;  Surgeon: Lanelle Bal, DO;  Location: [facility];  Service: Endoscopy;;   BUNIONECTOMY WITH HAMMERTOE RECONSTRUCTION Left 05/09/2014   Procedure: LEFT MODIFIED MCBRIDE BUNIONECTOMY; LEFT SECOND HAMMER TOE CORRECTION;  Surgeon: Toni Arthurs, MD;  Location: [facility];  Service: Orthopedics;  Laterality: Left;   CERVICAL SPINE SURGERY  2009   CHOLECYSTECTOMY  2004   lap choli   COLONOSCOPY  07/08/2003   RMR: Left-sided diverticula, remainder of colonic mucosa and terminal ileum appeared normal.. Minimally friable internal hemorrhoids, otherwise normal rectum   COLONOSCOPY N/A 04/30/2013   Dr. Darrick Penna: mild diverticulosis in descending and sigmoid colon, internal hemorrhoids.    COLONOSCOPY WITH PROPOFOL N/A 07/04/2023   Procedure: COLONOSCOPY WITH PROPOFOL;  Surgeon: Lanelle Bal, DO;  Location: [facility];  Service: Endoscopy;  Laterality: N/A;  10:45 AM, ASA 3, pt knows to arrive at 8:00   DILATION AND CURETTAGE OF UTERUS     ESOPHAGOGASTRODUODENOSCOPY (EGD) WITH PROPOFOL N/A 07/11/2018   Procedure: ESOPHAGOGASTRODUODENOSCOPY (EGD) WITH PROPOFOL;  Surgeon: West Bali, MD;  Location: [facility];  Service: Endoscopy;  Laterality: N/A;  9:30am   KNEE ARTHROSCOPY Right    NASAL SINUS SURGERY     NM MYOCAR PERF WALL MOTION  10/2011   bruce myoview - breast attenuation noted in anterior region; EF 65%; no ischemia/infarct/scar; low risk   POLYPECTOMY  07/04/2023   Procedure: POLYPECTOMY;  Surgeon: Lanelle Bal, DO;  Location: [facility];  Service: Endoscopy;;   right ankle surgery     SAVORY DILATION N/A 07/11/2018   Procedure: SAVORY DILATION;  Surgeon: West Bali, MD;  Location: [facility];  Service: Endoscopy;  Laterality: N/A;   SHOULDER SURGERY     TONSILLECTOMY  1980   TUBAL LIGATION      Past Family History   Family History  Problem Relation Age of Onset   Diabetes Mother    Hypertension Mother    Hyperlipidemia Mother    Ulcerative colitis Father    Breast cancer Maternal Grandmother    Hypertension Maternal Grandmother    Diabetes Maternal Grandmother    Stroke Maternal Grandmother    Asthma Maternal Grandmother    Stroke Maternal Grandfather    Stroke  Paternal Grandmother    Diabetes Paternal Grandmother    Heart disease Paternal Grandmother    Heart disease Paternal Grandfather    Hyperlipidemia Brother    Hyperlipidemia Sister    Hypertension Sister    Diabetes Sister    Down syndrome Child    Heart disease Child    Colon cancer Neg Hx     Past Social History   Social History   Socioeconomic History   Marital status: Married    Spouse name: Remi Deter   Number of children: 1   Years of education: 14   Highest education level: Not on file  Occupational History   Occupation: Unemployed    Employer: [employer]    Employer: UNEMPLOYED  Tobacco Use   Smoking status: Former    Current packs/day: 0.00    Average packs/day: 0.5 packs/day for 40.0 years (20.0 ttl pk-yrs)    Types: Cigarettes    Start date: 12/24/1973    Quit date: 12/24/2013    Years since quitting: 10.0   Smokeless tobacco: Never   Tobacco comments:    using vape as aid to sustain from smoking  Vaping Use   Vaping status: Never Used  Substance and Sexual Activity   Alcohol use: No   Drug use: No   Sexual activity: Not on file  Other Topics Concern   Not on file  Social History Narrative   One son, Down syndrome,is right handed ,resides with son and husband    Right handed   Social Drivers of Health   Financial Resource Strain: Low Risk  (11/24/2023)   Received from [facility]   Overall Financial Resource Strain (CARDIA)    Difficulty of Paying Living Expenses: Not very hard  Food Insecurity: No Food Insecurity (11/24/2023)   Received from [facility]   Hunger Vital Sign    Worried About Running Out of Food in the Last Year: Never true    Ran Out of Food in the Last Year: Never true  Transportation Needs: No Transportation Needs (11/24/2023)   Received from [facility] - Transportation    Lack of Transportation (Medical): No    Lack of Transportation (Non-Medical): No  Physical Activity: Insufficiently Active (11/24/2023)   Received from [facility]   Exercise Vital Sign    Days of Exercise per Week: 2 days    Minutes of Exercise per Session: 10 min  Stress: Stress Concern Present (11/24/2023)   Received from [facility] - Occupational Stress Questionnaire    Feeling of Stress : Very much  Social Connections: Somewhat Isolated (11/24/2023)   Received from [facility]   Social Network    How would you rate your social network (family, work, friends)?: Restricted participation with some degree of social isolation  Intimate Partner Violence: Not At Risk (11/24/2023)   Received from [facility]   HITS    Over the last 12 months how often did your partner physically hurt you?: Never    Over the last 12 months how often did your partner insult you or talk down to you?: Never    Over the last 12 months how often did your partner threaten you with physical harm?: Never    Over the last 12 months how often did your partner scream or curse at you?: Never    Review of Systems   General: Negative for anorexia, weight loss, fever, chills, fatigue, weakness. ENT: Negative for hoarseness, difficulty  swallowing , nasal congestion. CV: Negative for chest pain, angina, palpitations, dyspnea  on exertion, peripheral edema.  Respiratory: Negative for dyspnea at rest, dyspnea on exertion, cough, sputum, wheezing.  GI: See history of present illness. GU:  Negative for dysuria, hematuria, urinary incontinence, urinary frequency, nocturnal urination.  Endo: Negative for unusual weight change.     Physical Exam   There were no vitals taken for this visit.   General: Well-nourished, well-developed in no acute distress.  Eyes: No icterus. Mouth: Oropharyngeal mucosa moist and pink , no lesions erythema or exudate. Lungs: Clear to auscultation bilaterally.  Heart: Regular rate and rhythm, no murmurs rubs or gallops.  Abdomen: Bowel sounds are normal, nontender, nondistended, no hepatosplenomegaly or masses,  no abdominal bruits or hernia , no rebound or guarding.  Rectal: ***  Extremities: No lower extremity edema. No clubbing or deformities. Neuro: Alert and oriented x 4   Skin: Warm and dry, no jaundice.   Psych: Alert and cooperative, normal mood and affect.  Labs   *** Imaging Studies   No results found.  Assessment       PLAN   ***   Leanna Battles. Melvyn Neth, MHS, PA-C [facility]

## 2024-01-09 ENCOUNTER — Ambulatory Visit: Payer: Medicare Other | Admitting: Gastroenterology

## 2024-01-09 ENCOUNTER — Encounter: Payer: Self-pay | Admitting: Gastroenterology

## 2024-01-09 VITALS — BP 131/69 | HR 88 | Temp 98.3°F | Ht 68.0 in | Wt 203.0 lb

## 2024-01-09 DIAGNOSIS — R197 Diarrhea, unspecified: Secondary | ICD-10-CM

## 2024-01-09 DIAGNOSIS — K219 Gastro-esophageal reflux disease without esophagitis: Secondary | ICD-10-CM | POA: Diagnosis not present

## 2024-01-09 DIAGNOSIS — R1310 Dysphagia, unspecified: Secondary | ICD-10-CM | POA: Diagnosis not present

## 2024-01-09 DIAGNOSIS — K529 Noninfective gastroenteritis and colitis, unspecified: Secondary | ICD-10-CM

## 2024-01-09 DIAGNOSIS — R1319 Other dysphagia: Secondary | ICD-10-CM

## 2024-01-09 MED ORDER — CHOLESTYRAMINE 4 G PO PACK: 4.0000 g | PACK | Freq: Every day | ORAL | 3 refills | Status: DC

## 2024-01-09 NOTE — Patient Instructions (Addendum)
Stop colestipol.  Start cholestyramine 4 g once daily, separate from other medications by 2 hours. We may have to adjust the dose, let me know if you are having issues with diarrhea or constipation. Continue pantoprazole 40 mg twice daily before meals. Will be in touch to schedule upper endoscopy in the near future.

## 2024-01-10 ENCOUNTER — Encounter: Payer: Self-pay | Admitting: *Deleted

## 2024-01-10 ENCOUNTER — Telehealth: Payer: Self-pay | Admitting: Gastroenterology

## 2024-01-10 NOTE — Telephone Encounter (Signed)
Pt has been scheduled for 02/13/24. Instructions mailed

## 2024-01-10 NOTE — Telephone Encounter (Signed)
Patient seen in the office yesterday. I have finished chart review and need to move forward with EGD/ED for dysphagia. Dr. Marletta Lor.  AS[facility] 3, room 3.

## 2024-01-19 DIAGNOSIS — M47812 Spondylosis without myelopathy or radiculopathy, cervical region: Secondary | ICD-10-CM | POA: Diagnosis not present

## 2024-01-19 DIAGNOSIS — M47817 Spondylosis without myelopathy or radiculopathy, lumbosacral region: Secondary | ICD-10-CM | POA: Diagnosis not present

## 2024-01-19 DIAGNOSIS — Z981 Arthrodesis status: Secondary | ICD-10-CM | POA: Diagnosis not present

## 2024-01-19 DIAGNOSIS — M47816 Spondylosis without myelopathy or radiculopathy, lumbar region: Secondary | ICD-10-CM | POA: Diagnosis not present

## 2024-01-20 ENCOUNTER — Telehealth: Payer: Self-pay | Admitting: Internal Medicine

## 2024-01-20 MED ORDER — ATENOLOL 50 MG PO TABS: 50.0000 mg | ORAL_TABLET | Freq: Every day | ORAL | 3 refills | Status: DC

## 2024-01-20 NOTE — Telephone Encounter (Signed)
 Spoke with patient regarding her symptoms. Pt stated she woke up last night in a cold sweat and her HR was 165. She was experiencing GERD as well. Pt continued to monitor HR and eventually it went down to 120. Pt was asked to take her HR on the phone and it was 105. Pt stated she has SOB that comes and goes, and no chest pain. Pt's blood pressure was 130/80 last night and 120/82 this morning. Pt's information was taken to DOD (Dr. Excell Seltzer). He suggested the pt increase her atenolol to 50 mg once daily. Dr. Excell Seltzer also said she could try another Zio heart monitor, however the pt stated she got a rash from the adhesive the last time she did one and opted not to do another. The order for atenolol was placed and sent to the pt's pharmacy of choice. Pt was told to go to the ED or Urgent Care should she begin to experience SOB or an increased HR again.

## 2024-01-20 NOTE — Telephone Encounter (Signed)
 STAT if HR is under 50 or over 120 (normal HR is 60-100 beats per minute)  What is your heart rate? 110  Do you have a log of your heart rate readings (document readings)? Went to 162 came back down to 120 in 15 mins last night 02/27  Do you have any other symptoms?  SOB, tiredness at the time of increased HR

## 2024-02-06 ENCOUNTER — Ambulatory Visit: Admitting: Nurse Practitioner

## 2024-02-07 ENCOUNTER — Ambulatory Visit

## 2024-02-07 ENCOUNTER — Ambulatory Visit: Attending: Cardiology | Admitting: Cardiology

## 2024-02-07 ENCOUNTER — Telehealth: Payer: Self-pay | Admitting: Internal Medicine

## 2024-02-07 ENCOUNTER — Encounter: Payer: Self-pay | Admitting: Cardiology

## 2024-02-07 ENCOUNTER — Other Ambulatory Visit: Payer: Self-pay | Admitting: Cardiology

## 2024-02-07 VITALS — BP 124/72 | HR 90 | Ht 68.0 in | Wt 202.8 lb

## 2024-02-07 DIAGNOSIS — I493 Ventricular premature depolarization: Secondary | ICD-10-CM

## 2024-02-07 DIAGNOSIS — I10 Essential (primary) hypertension: Secondary | ICD-10-CM

## 2024-02-07 DIAGNOSIS — I5032 Chronic diastolic (congestive) heart failure: Secondary | ICD-10-CM

## 2024-02-07 DIAGNOSIS — I5030 Unspecified diastolic (congestive) heart failure: Secondary | ICD-10-CM

## 2024-02-07 DIAGNOSIS — I517 Cardiomegaly: Secondary | ICD-10-CM | POA: Diagnosis not present

## 2024-02-07 NOTE — Progress Notes (Unsigned)
 Patient enrolled for [employer] to ship a long term monitor to her address on file. Sensitive skin option selected.  Dr. Ronette Deter to read.

## 2024-02-07 NOTE — Telephone Encounter (Signed)
 Spoke with patient and she states her  HR was up to 163 last night with a lot of PVC's. She states two weeks ago her atenolol was increased to 50 mg for the same concerns. She stated she didn't go to the ED but she did relax and it went down to 120 before bed. She did not have chest pain but she had some SOB and was tired. This morning her HR was 118. Currently back down to 92. She does not want to wear a monitor because the last time she broke out in a real bad rash.   Appointment scheduled with APP

## 2024-02-07 NOTE — Progress Notes (Signed)
 Cardiology Office Note:  .   Date:  02/07/2024  ID:  Natasha Wright, DOB 04-Mar-1961, MRN [id number] PCP: Elfredia Nevins, MD  [facility] Providers Cardiologist:  Christell Constant, MD  History of Present Illness: .   Natasha Wright is a 63 y.o. female with a past medical history of PVCs, hypertension, chronic heart failure with preserved ejection fraction, GERD, fibromyalgia.  Patient is followed by Dr. Izora Ribas and presents today for same-day add-on for evaluation of tachycardia  Per chart review, patient previously wore cardiac monitor in 03/2017 that showed sinus rhythm and sinus tachycardia.  She underwent echocardiogram on 04/12/2027 that showed EF 55-60%, mild LVH, normal wall motion.  Nuclear stress test in 04/2017 was a low risk study with EF 61%.  There was a very small, mild intensity, reversible apical defect that was most likely consistent with variable soft tissue attenuation.  More recently, echocardiogram 03/29/2022 showed EF 60-65% with no regional wall motion abnormalities, normal RV systolic function, normal PA systolic pressure, no significant valvular abnormalities.  Cardiac event monitor in 03/2022 showed predominantly normal sinus rhythm with heart rate ranging from 61-134 bpm.  Average heart rate was 100 bpm.  There were rare PVC ACs, occasional PVCs with 2% burden.  A coronary CTA in 09/2022 showed a coronary calcium score of 0 with no evidence of CAD.  Patient was last seen by Dr. Izora Ribas in 07/2023.  At that time, patient reported that she had been doing well.  She had been diagnosed with bile acid malabsorption and was started on new GI medications.  Denied chest pain, lower extremity swelling, palpitations.  She was treated with as needed Lasix and spironolactone 25 mg daily for heart failure with preserved ejection fraction.  Remained on beta-blocker for PVCs  The patient, with a past medical history of PVCs, lymphedema, and fluid in the heart,  presents with a chief complaint of an elevated heart rate that reached 163 bpm the previous night. Patient reported that she had finished dinner when she started to feel lightheaded and broke out in a cold sweat. She sat down to check her HR and it was elevated. She sat and rested for a few minutes, and her HR gradually improved. Before she went to bed, her HR was around 110. Today in clinic, HR 90 BPM. When her HR was very fast, she felt some chest pressure. This improved as her HR improved. Today, she continues to have mild chest soreness that is reproducible on palpation. The patient reports that this is the second episode of such symptoms, with the first occurring two weeks prior. The patient also reports a history of PTSD and a high-stress lifestyle, but denies any acute stress or anxiety at the time of the episodes.  The patient is currently on Atenolol, Lasix, and Spironolactone. Despite increasing the Atenolol dosage to 50mg  after the first episode, the patient experienced a similar episode two weeks later. The patient denies any recent changes in medication, except for a switch from a pill to a powder form of a liver medication about a month and a half ago.  The patient also reports a history of lymphedema, with significant fluid retention in the legs and arms. Despite taking Lasix and Spironolactone, the patient feels that she is still retaining fluid. The patient denies any recent swelling in the ankles but reports getting out of breath easily, which has been an ongoing issue.  ROS: Per HPI   Studies Reviewed: .   Cardiac Studies &  Procedures   ______________________________________________________________________________________________   STRESS TESTS  MYOCARDIAL PERFUSION IMAGING 06/05/2020  Narrative  There was no ST segment deviation noted during stress.  Nuclear stress EF: 53%. The left ventricular ejection fraction is normal (55-65%). Visually, the EF is higher than the computer  calculated 53%.  This is a low risk study. There is no evidence of ischemia or previous infarction .  The study is normal.   ECHOCARDIOGRAM  ECHOCARDIOGRAM COMPLETE 03/29/2022  Narrative ECHOCARDIOGRAM REPORT    Patient Name:   Natasha Wright Claude Date of Exam: 03/29/2022 Medical Rec #:  [id number]        Height:       67.5 in Accession #:    [id number]       Weight:       191.0 lb Date of Birth:  [birth year]       BSA:          1.994 m Patient Age:    60 years         BP:           133/82 mmHg Patient Gender: F                HR:           87 bpm. [facility]  Procedure: 2D Echo, Cardiac Doppler and Color Doppler  Indications:    PVC's  History:        Patient has prior history of Echocardiogram examinations, most recent 04/11/2017. Arrythmias:PVC, Signs/Symptoms:Fatigue, Chest Pain and Dyspnea; Risk Factors:Former Smoker.  Sonographer:    Mikki Harbor Referring Phys: [phone] Wendall Stade  IMPRESSIONS   1. Left ventricular ejection fraction, by estimation, is 60 to 65%. The left ventricle has normal function. The left ventricle has no regional wall motion abnormalities. Left ventricular diastolic parameters were normal. 2. Right ventricular systolic function is normal. The right ventricular size is normal. There is normal pulmonary artery systolic pressure. The estimated right ventricular systolic pressure is 21.3 mmHg. 3. The mitral valve is grossly normal. Trivial mitral valve regurgitation. No evidence of mitral stenosis. 4. The aortic valve is tricuspid. Aortic valve regurgitation is not visualized. No aortic stenosis is present. 5. The inferior vena cava is normal in size with greater than 50% respiratory variability, suggesting right atrial pressure of 3 mmHg.  FINDINGS Left Ventricle: Left ventricular ejection fraction, by estimation, is 60 to 65%. The left ventricle has normal function. The left ventricle has no regional wall motion abnormalities. The  left ventricular internal cavity size was normal in size. There is no left ventricular hypertrophy. Left ventricular diastolic parameters were normal.  Right Ventricle: The right ventricular size is normal. No increase in right ventricular wall thickness. Right ventricular systolic function is normal. There is normal pulmonary artery systolic pressure. The tricuspid regurgitant velocity is 2.14 m/s, and with an assumed right atrial pressure of 3 mmHg, the estimated right ventricular systolic pressure is 21.3 mmHg.  Left Atrium: Left atrial size was normal in size.  Right Atrium: Right atrial size was normal in size.  Pericardium: There is no evidence of pericardial effusion.  Mitral Valve: The mitral valve is grossly normal. Trivial mitral valve regurgitation. No evidence of mitral valve stenosis. MV peak gradient, 5.2 mmHg. The mean mitral valve gradient is 2.0 mmHg.  Tricuspid Valve: The tricuspid valve is grossly normal. Tricuspid valve regurgitation is trivial. No evidence of tricuspid stenosis.  Aortic Valve: The aortic valve is tricuspid. Aortic valve regurgitation is  not visualized. No aortic stenosis is present. Aortic valve mean gradient measures 3.0 mmHg. Aortic valve peak gradient measures 4.7 mmHg. Aortic valve area, by VTI measures 2.53 cm.  Pulmonic Valve: The pulmonic valve was grossly normal. Pulmonic valve regurgitation is not visualized. No evidence of pulmonic stenosis.  Aorta: The aortic root and ascending aorta are structurally normal, with no evidence of dilitation.  Venous: The right lower pulmonary vein is normal. The inferior vena cava is normal in size with greater than 50% respiratory variability, suggesting right atrial pressure of 3 mmHg.  IAS/Shunts: The atrial septum is grossly normal.   LEFT VENTRICLE PLAX 2D LVIDd:         3.90 cm     Diastology LVIDs:         2.90 cm     LV e' medial:    9.36 cm/s LV PW:         1.10 cm     LV E/e' medial:  7.6 LV  IVS:        1.20 cm     LV e' lateral:   8.16 cm/s LVOT diam:     2.00 cm     LV E/e' lateral: 8.7 LV SV:         62 LV SV Index:   31 LVOT Area:     3.14 cm  LV Volumes (MOD) LV vol d, MOD A2C: 64.5 ml LV vol d, MOD A4C: 53.5 ml LV vol s, MOD A2C: 22.4 ml LV vol s, MOD A4C: 23.8 ml LV SV MOD A2C:     42.1 ml LV SV MOD A4C:     53.5 ml LV SV MOD BP:      35.2 ml  RIGHT VENTRICLE RV Basal diam:  3.15 cm RV Mid diam:    2.80 cm TAPSE (M-mode): 2.8 cm  LEFT ATRIUM           Index        RIGHT ATRIUM           Index LA diam:      3.80 cm 1.91 cm/m   RA Area:     13.90 cm LA Vol (A2C): 60.2 ml 30.19 ml/m  RA Volume:   33.00 ml  16.55 ml/m LA Vol (A4C): 31.9 ml 16.00 ml/m AORTIC VALVE                    PULMONIC VALVE AV Area (Vmax):    2.39 cm     PV Vmax:       0.61 m/s AV Area (Vmean):   2.33 cm     PV Peak grad:  1.5 mmHg AV Area (VTI):     2.53 cm AV Vmax:           108.00 cm/s AV Vmean:          74.000 cm/s AV VTI:            0.246 m AV Peak Grad:      4.7 mmHg AV Mean Grad:      3.0 mmHg LVOT Vmax:         82.20 cm/s LVOT Vmean:        54.900 cm/s LVOT VTI:          0.198 m LVOT/AV VTI ratio: 0.80  AORTA Ao Root diam: 3.00 cm Ao Asc diam:  2.70 cm  MITRAL VALVE               TRICUSPID  VALVE MV Area (PHT): 4.21 cm    TR Peak grad:   18.3 mmHg MV Area VTI:   2.79 cm    TR Vmax:        214.00 cm/s MV Peak grad:  5.2 mmHg MV Mean grad:  2.0 mmHg    SHUNTS MV Vmax:       1.14 m/s    Systemic VTI:  0.20 m MV Vmean:      71.1 cm/s   Systemic Diam: 2.00 cm MV Decel Time: 180 msec MV E velocity: 70.70 cm/s MV A velocity: 96.80 cm/s MV E/A ratio:  0.73  Lennie Odor MD Electronically signed by Lennie Odor MD Signature Date/Time: 03/29/2022/11:31:48 AM    Final    MONITORS  CARDIAC EVENT MONITOR 04/13/2022   CT SCANS  CT CORONARY MORPH W/CTA COR W/SCORE 09/30/2022  Addendum 09/30/2022  2:29 PM ADDENDUM REPORT: 09/30/2022 14:27  EXAM: OVER-READ  INTERPRETATION  CT CHEST  The following report is an over-read performed by radiologist Dr. Roque Lias [facility] on 09/30/2022. This over-read does not include interpretation of cardiac or coronary anatomy or pathology. The coronary CTA interpretation by the cardiologist is attached.  COMPARISON:  None.  FINDINGS: The visualized portions of the extracardiac vascular structures are unremarkable. Visualized mediastinum is unremarkable. Visualized portion of upper abdomen is unremarkable. Visualized pulmonary parenchyma is unremarkable. Visualized skeleton is unremarkable.  IMPRESSION: No definite abnormality seen involving the visualized extracardiac structures of the chest.   Electronically Signed By: Lupita Raider M.D. On: 09/30/2022 14:27  Narrative CLINICAL DATA:  63 yo female with chest pain  EXAM: Cardiac/Coronary CTA  TECHNIQUE: A non-contrast, gated CT scan was obtained with axial slices of 3 mm through the heart for calcium scoring. Calcium scoring was performed using the Agatston method. A 120 kV prospective, gated, contrast cardiac scan was obtained. Gantry rotation speed was 250 msecs and collimation was 0.6 mm. Two sublingual nitroglycerin tablets (0.8 mg) were given. The 3D data set was reconstructed in 5% intervals of the 35-75% of the R-R cycle. Diastolic phases were analyzed on a dedicated workstation using MPR, MIP, and VRT modes. The patient received 95 cc of contrast.  FINDINGS: Image quality: Excellent.  Noise artifact is: Limited.  Coronary Arteries:  Normal coronary origin.  Right dominance.  Left main: The left main is a large caliber vessel with a normal take off from the left coronary cusp that bifurcates to form a left anterior descending artery and a left circumflex artery. There is no plaque or stenosis.  Left anterior descending artery: The LAD is patent without evidence of plaque or stenosis. The LAD gives off 1  patent diagonal branch.  Left circumflex artery: The LCX is non-dominant and patent with no evidence of plaque or stenosis. The LCX gives off 1 patent obtuse marginal branch.  Right coronary artery: The RCA is dominant with normal take off from the right coronary cusp. There is no evidence of plaque or stenosis. The RCA terminates as a PDA and right posterolateral branch without evidence of plaque or stenosis.  Right Atrium: Right atrial size is within normal limits.  Right Ventricle: The right ventricular cavity is within normal limits.  Left Atrium: Left atrial size is normal in size with no left atrial appendage filling defect.  Left Ventricle: The ventricular cavity size is within normal limits. There are no stigmata of prior infarction. There is no abnormal filling defect.  Pulmonary arteries: Normal in size without proximal filling  defect.  Pulmonary veins: Normal pulmonary venous drainage.  Pericardium: Normal thickness with no significant effusion or calcium present.  Cardiac valves: The aortic valve is trileaflet without significant calcification. The mitral valve is normal structure without significant calcification.  Aorta: Normal caliber with no significant disease.  Extra-cardiac findings: See attached radiology report for non-cardiac structures.  IMPRESSION: 1. Coronary calcium score of 0. This was 0 percentile for age-, sex, and race-matched controls.  2. Normal coronary origin with right dominance.  3. No evidence of CAD.  RECOMMENDATIONS: 1 CAD-RADS 0: No evidence of CAD (0%). Consider non-atherosclerotic causes of chest pain.  Olga Millers, MD  Electronically Signed: By: Olga Millers M.D. On: 09/30/2022 14:12     ______________________________________________________________________________________________      Risk Assessment/Calculations:             Physical Exam:   VS:  BP 124/72   Pulse 90   Ht 5\' 8"  (1.727 m)   Wt 202  lb 12.8 oz (92 kg)   SpO2 96%   BMI 30.84 kg/m    Wt Readings from Last 3 Encounters:  02/07/24 202 lb 12.8 oz (92 kg)  01/09/24 203 lb (92.1 kg)  10/24/23 200 lb 12.8 oz (91.1 kg)    GEN: Well nourished, well developed in no acute distress. Sitting upright on the exam table  NECK: No JVD CARDIAC: RRR, no murmurs, rubs, gallops. Radial pulses 2+ bilaterally  RESPIRATORY:  Clear to auscultation without rales, wheezing or rhonchi. Normal WOB on room air   ABDOMEN: Soft, non-tender, non-distended EXTREMITIES:  No edema in BLE; No deformity   ASSESSMENT AND PLAN: .    PVCs Sinus tachycardia  - Patient's most recent cardiac monitor was completed 03/2022 and showed predominantly NSR with average HR 100 BPM, 2% PVC burden  - She has been on atenolol 50 mg daily  - Reports that she had an episode of tachycardia yesterday evening. She had eaten dinner when she started to feel lightheaded and developed a cold sweat. She checked her HR and it was elevated to the 160s. After resting, HR did improve. She had some chest pressure when HR was very elevated, but this improved as her HR decreased  - EKG today shows normal sinus rhythm with HR 90 BPM  - Patient denies recent changes to medications. No history of diabetes or low blood sugar. She does not drink much caffeine and she makes a point of staying hydrated. Admits to being under quite a bit of stress in her daily life, but was not acutely stressed during the episode of tachycardia  - Ordered BMP, mag, CBC, TSH, Free T4 for evaluation of palpitations/tachycardia  - Patient previously had a skin reaction to a monitor in the past. She is willing to try the hypoallergenic monitor. Ordered 14 day monitor  - Ordered echocardiogram  - Continue atenolol 50 mg daily   Chest pain  - Coronary CTA in 09/2022 showed a coronary calcium score of 0, no evidence of CAD  - Patient had some chest pressure yesterday evening when HR was elevated to the 160s. Chest  pressure improved as HR improved. She continues to have a bit of soreness in her chest.  - Chest soreness is reproducible on palpation on exam today  - She denies recent chest pain or dyspnea on exertion  - With reassuring coronary CTA in 09/2022, atypical symptoms, no plans to repeat ischemic evaluation at this time   Chronic HFpEF  - Echocardiogram from 03/2022 showed  EF 60-65%, no regional wall motion abnormalities, normal RV function, normal PA systolic pressure, no significant valvular abnormalities - Patient is overall euvolemic on exam today. However, she reports feeling like she might be holding onto some fluid  - Ordered BNP to assess volume status  - Continue lasix 20 mg daily  - Continue spironolactone 25 mg daily   HTN  - BP well controlled  - She denies symptoms of orthostatic hypotension  - Continue atenolol 50 mg daily, spironolactone 25 mg daily     Dispo: Follow up in 3 months with APP   Signed, Jonita Albee, PA-C

## 2024-02-07 NOTE — Telephone Encounter (Signed)
 STAT if HR is under 50 or over 120 (normal HR is 60-100 beats per minute)  What is your heart rate? 163 last night and this morning it is 112- patient said she was instructed to double her Atenolol  Do you have a log of your heart rate readings (document readings)?   Do you have any other symptoms? Cold sweat, dizzy, did not feel good whrn this happen, tired this morning and no other symptoms-patient says she would like to have an appointment

## 2024-02-07 NOTE — Telephone Encounter (Signed)
 noted

## 2024-02-07 NOTE — Telephone Encounter (Signed)
 Pt called to cancel her procedure for Monday 02/13/24 due to having issue with tachycardia. She has been in touch with cardiology about this issue. Message sent to endo to cancel procedure.

## 2024-02-07 NOTE — Patient Instructions (Addendum)
 Medication Instructions:  No changes *If you need a refill on your cardiac medications before your next appointment, please call your pharmacy*  Lab Work: Today we are going to draw a BMP, Mag, CBC, TSH, Free T4, and BNP. If you have labs (blood work) drawn today and your tests are completely normal, you will receive your results only by: MyChart Message (if you have MyChart) OR A paper copy in the mail If you have any lab test that is abnormal or we need to change your treatment, we will call you to review the results.  Testing/Procedures: Your physician has requested that you have an echocardiogram. Echocardiography is a painless test that uses sound waves to create images of your heart. It provides your doctor with information about the size and shape of your heart and how well your heart's chambers and valves are working. This procedure takes approximately one hour. There are no restrictions for this procedure. Please do NOT wear cologne, perfume, aftershave, or lotions (deodorant is allowed). Please arrive 15 minutes prior to your appointment time.  Please note: We ask at that you not bring children with you during ultrasound (echo/ vascular) testing. Due to room size and safety concerns, children are not allowed in the ultrasound rooms during exams. Our front office staff cannot provide observation of children in our lobby area while testing is being conducted. An adult accompanying a patient to their appointment will only be allowed in the ultrasound room at the discretion of the ultrasound technician under special circumstances. We apologize for any inconvenience.  [employer] monitor will come to your house, You will have to where the monitor for 2 weeks.  Follow-Up: At [facility], you and your health needs are our priority.  As part of our continuing mission to provide you with exceptional heart care, we have created designated Provider Care Teams.  These Care Teams include  your primary Cardiologist (physician) and Advanced Practice Providers (APPs -  Physician Assistants and Nurse Practitioners) who all work together to provide you with the care you need, when you need it.  We recommend signing up for the patient portal called "MyChart".  Sign up information is provided on this After Visit Summary.  MyChart is used to connect with patients for Virtual Visits (Telemedicine).  Patients are able to view lab/test results, encounter notes, upcoming appointments, etc.  Non-urgent messages can be sent to your provider as well.   To learn more about what you can do with MyChart, go to [email or web address].    Your next appointment:   3 month(s)  Provider:   Christell Constant, MD  or Robet Leu, PA-C  Other Instructions

## 2024-02-08 LAB — BASIC METABOLIC PANEL
BUN/Creatinine Ratio: 11 — ABNORMAL LOW (ref 12–28)
BUN: 11 mg/dL (ref 8–27)
CO2: 21 mmol/L (ref 20–29)
Calcium: 9.5 mg/dL (ref 8.7–10.3)
Chloride: 103 mmol/L (ref 96–106)
Creatinine, Ser: 0.97 mg/dL (ref 0.57–1.00)
Glucose: 78 mg/dL (ref 70–99)
Potassium: 4.8 mmol/L (ref 3.5–5.2)
Sodium: 139 mmol/L (ref 134–144)
eGFR: 65 mL/min/{1.73_m2} (ref >59)

## 2024-02-08 LAB — CBC
Hematocrit: 43.6 % (ref 34.0–46.6)
Hemoglobin: 15 g/dL (ref 11.1–15.9)
MCH: 31.6 pg (ref 26.6–33.0)
MCHC: 34.4 g/dL (ref 31.5–35.7)
MCV: 92 fL (ref 79–97)
Platelets: 298 10*3/uL (ref 150–450)
RBC: 4.74 x10E6/uL (ref 3.77–5.28)
RDW: 13.2 % (ref 11.7–15.4)
WBC: 6.9 10*3/uL (ref 3.4–10.8)

## 2024-02-08 LAB — TSH: TSH: 1.5 u[IU]/mL (ref 0.450–4.500)

## 2024-02-08 LAB — MAGNESIUM: Magnesium: 2.2 mg/dL (ref 1.6–2.3)

## 2024-02-08 LAB — T4, FREE: Free T4: 0.91 ng/dL (ref 0.82–1.77)

## 2024-02-08 LAB — BRAIN NATRIURETIC PEPTIDE: BNP: 121.6 pg/mL — ABNORMAL HIGH (ref 0.0–100.0)

## 2024-02-09 ENCOUNTER — Encounter (HOSPITAL_COMMUNITY): Payer: Medicare Other

## 2024-02-10 ENCOUNTER — Telehealth: Payer: Self-pay

## 2024-02-10 DIAGNOSIS — M961 Postlaminectomy syndrome, not elsewhere classified: Secondary | ICD-10-CM | POA: Diagnosis not present

## 2024-02-10 DIAGNOSIS — M47816 Spondylosis without myelopathy or radiculopathy, lumbar region: Secondary | ICD-10-CM | POA: Diagnosis not present

## 2024-02-10 DIAGNOSIS — M48061 Spinal stenosis, lumbar region without neurogenic claudication: Secondary | ICD-10-CM | POA: Diagnosis not present

## 2024-02-10 DIAGNOSIS — M5412 Radiculopathy, cervical region: Secondary | ICD-10-CM | POA: Diagnosis not present

## 2024-02-10 DIAGNOSIS — M5416 Radiculopathy, lumbar region: Secondary | ICD-10-CM | POA: Diagnosis not present

## 2024-02-10 DIAGNOSIS — M47812 Spondylosis without myelopathy or radiculopathy, cervical region: Secondary | ICD-10-CM | POA: Diagnosis not present

## 2024-02-10 NOTE — Telephone Encounter (Signed)
-----   Message from Jonita Albee sent at 02/09/2024  7:10 AM EDT ----- Please tell patient that her lab work showed normal kidney function, normal electrolytes, normal thyroid function. CBC also normal.   Her BNP (marker of extra fluid in the system) was minimally elevated. For this, she can increase her lasix to 40 mg daily for the next 2 days to see if her symptoms improve. Then, she should go back to taking her usual 20 mg daily   Thanks KJ

## 2024-02-10 NOTE — Telephone Encounter (Signed)
 Left message to call back

## 2024-02-13 ENCOUNTER — Ambulatory Visit (HOSPITAL_COMMUNITY): Admit: 2024-02-13 | Payer: Medicare Other | Admitting: Internal Medicine

## 2024-02-13 ENCOUNTER — Encounter (HOSPITAL_COMMUNITY): Payer: Self-pay

## 2024-02-13 SURGERY — ESOPHAGOGASTRODUODENOSCOPY (EGD) WITH PROPOFOL
Anesthesia: Choice

## 2024-02-14 NOTE — Telephone Encounter (Signed)
 Left message to call back

## 2024-02-16 NOTE — Telephone Encounter (Signed)
 Left message to call back

## 2024-02-17 NOTE — Telephone Encounter (Signed)
 Called and spoke to pt. DOB and last name verified.  Discussed lab results and APP's recommendations. Pt agreed to start increased Lasix over the weekend. Pt verbalized understanding.  Pt's other concerns: Heart monitor is defected; keeps blinking red on Monday. Pt called company and was on hold for 1 hour. Pt asked for another monitor - company verified that monitor will be mailed out and will be received next day (Pt states they said this on Monday 3/24) Has yet to receive monitor. HR elevated yesterday while in garden: up to 140s. Stayed that way until midnight. HR today: 80s ECHO scheduled for 4/9. Pt requested to see Dr. Izora Ribas after echo. Will reach out to scheduling team to place pt on wait list.    Jonita Albee, PA-C 02/09/2024  7:10 AM EDT     Please tell patient that her lab work showed normal kidney function, normal electrolytes, normal thyroid function. CBC also normal.   Her BNP (marker of extra fluid in the system) was minimally elevated. For this, she can increase her lasix to 40 mg daily for the next 2 days to see if her symptoms improve. Then, she should go back to taking her usual 20 mg daily   Thanks KJ

## 2024-02-17 NOTE — Telephone Encounter (Signed)
 Pt returning call for [facility] Please advise

## 2024-02-21 DIAGNOSIS — I5032 Chronic diastolic (congestive) heart failure: Secondary | ICD-10-CM | POA: Diagnosis not present

## 2024-02-21 DIAGNOSIS — I493 Ventricular premature depolarization: Secondary | ICD-10-CM | POA: Diagnosis not present

## 2024-02-22 ENCOUNTER — Ambulatory Visit (INDEPENDENT_AMBULATORY_CARE_PROVIDER_SITE_OTHER): Payer: Medicare Other | Admitting: Otolaryngology

## 2024-02-22 ENCOUNTER — Encounter: Payer: Self-pay | Admitting: Gastroenterology

## 2024-02-22 VITALS — BP 113/72 | HR 80 | Ht 68.0 in | Wt 190.0 lb

## 2024-02-22 DIAGNOSIS — H9313 Tinnitus, bilateral: Secondary | ICD-10-CM

## 2024-02-22 DIAGNOSIS — H903 Sensorineural hearing loss, bilateral: Secondary | ICD-10-CM | POA: Diagnosis not present

## 2024-02-22 DIAGNOSIS — R0981 Nasal congestion: Secondary | ICD-10-CM

## 2024-02-22 DIAGNOSIS — R0982 Postnasal drip: Secondary | ICD-10-CM | POA: Diagnosis not present

## 2024-02-22 DIAGNOSIS — J310 Chronic rhinitis: Secondary | ICD-10-CM

## 2024-02-22 DIAGNOSIS — J343 Hypertrophy of nasal turbinates: Secondary | ICD-10-CM

## 2024-02-22 MED ORDER — MOMETASONE FUROATE 50 MCG/ACT NA SUSP: 2.0000 | Freq: Every day | NASAL | 12 refills | Status: DC

## 2024-02-25 DIAGNOSIS — H903 Sensorineural hearing loss, bilateral: Secondary | ICD-10-CM | POA: Insufficient documentation

## 2024-02-25 DIAGNOSIS — R0982 Postnasal drip: Secondary | ICD-10-CM | POA: Insufficient documentation

## 2024-02-25 DIAGNOSIS — J343 Hypertrophy of nasal turbinates: Secondary | ICD-10-CM | POA: Insufficient documentation

## 2024-02-25 DIAGNOSIS — H9313 Tinnitus, bilateral: Secondary | ICD-10-CM | POA: Insufficient documentation

## 2024-02-25 DIAGNOSIS — J310 Chronic rhinitis: Secondary | ICD-10-CM | POA: Insufficient documentation

## 2024-02-25 NOTE — Progress Notes (Signed)
 Patient ID: Natasha Wright, female   DOB: 04-29-1961, 63 y.o.   MRN: [id number]  Follow-up: Chronic nasal congestion, nasal drainage, bilateral tinnitus, bilateral hearing loss  HPI: The patient is a 63 year old female who returns today for follow-up evaluation.  The patient was previously seen for chronic nasal obstruction, hearing loss, and tinnitus.  She underwent turbinate reduction surgery in October 2020.  At her last visit in 2022, she was noted to have bilateral high-frequency sensorineural hearing loss and bilateral tinnitus.  Diffuse nasal mucosal congestion was noted.  She was treated with Flonase and Atrovent.  The patient returns today complaining of persistent bilateral tinnitus and hearing loss.  She has not noted any significant change in her hearing.  Her nasal congestion has worsened over the past year.  She also complains of frequent postnasal drainage.  It should be noted that the patient cannot tolerate the use of Flonase due to the side effects.  Currently she denies any otalgia, otorrhea, facial pain, or fever.  Exam: General: Communicates without difficulty, well nourished, no acute distress. Head: Normocephalic, no evidence injury, no tenderness, facial buttresses intact without stepoff. Eyes: PERRL, EOMI. No scleral icterus, conjunctivae clear. Neuro: CN II exam reveals vision grossly intact.  No nystagmus at any point of gaze. Ears: Auricles well formed without lesions.  Ear canals are intact without mass or lesion.  No erythema or edema is appreciated.  The TMs are intact without fluid. Nose: External evaluation reveals normal support and skin without lesions.  Dorsum is intact.  Anterior rhinoscopy reveals congested and edematous mucosa over anterior aspect of the inferior turbinates and nasal septum.  No purulence is noted. Middle meatus is not well visualized. Oral:  Oral cavity and oropharynx are intact, symmetric, without erythema or edema.  Mucosa is moist without lesions.  Neck: Full range of motion without pain.  There is no significant lymphadenopathy.  No masses palpable.  Thyroid bed within normal limits to palpation.  Parotid glands and submandibular glands equal bilaterally without mass.  Trachea is midline. Neuro:  CN 2-12 grossly intact. Gait normal. Vestibular: No nystagmus at any point of gaze.   Procedure: Flexible Nasal Endoscopy Description: Risks, benefits, and alternatives of flexible endoscopy were explained to the patient. Specific mention was made of the risk of throat numbness with difficulty swallowing, possible bleeding from the nose and mouth, and pain from the procedure. The patient gave oral consent to proceed.  The flexible scope was inserted into the right nasal cavity. Endoscopy of the interior nasal cavity, superior, inferior, and middle meatus was performed. The sphenoid-ethmoid recess was examined. Edematous mucosa was noted. No polyp, mass, or lesion was appreciated. Olfactory cleft was clear. Nasopharynx was clear. Turbinates were well healed. Incomplete response to decongestion. The procedure was repeated on the contralateral side with similar findings. The patient tolerated the procedure well.   Assessment: 1.  Chronic rhinitis with diffuse nasal mucosal congestion.  Her inferior turbinates were previously reduced. 2.  Subjectively stable bilateral high-frequency sensorineural hearing loss. 3.  Her tinnitus is likely a result of her hearing loss. 4.  Chronic postnasal drainage.  Plan: 1.  The physical exam and nasal endoscopy findings are reviewed with the patient. 2.  The patient is reassured that no infection is noted today. 3.  Nasonex nasal spray 2 sprays each nostril daily. 4.  Continue Atrovent as needed to treat the nasal drainage. 5.  The strategies to cope with tinnitus, including the use of masker, hearing aids, tinnitus retraining  therapy, and avoidance of caffeine and alcohol are discussed. 6.  The patient will return for  reevaluation in 2 months.

## 2024-02-27 ENCOUNTER — Telehealth: Payer: Self-pay

## 2024-02-27 ENCOUNTER — Other Ambulatory Visit: Payer: Self-pay | Admitting: Internal Medicine

## 2024-02-27 MED ORDER — CHOLESTYRAMINE 4 G PO PACK
4.0000 g | PACK | Freq: Every day | ORAL | 3 refills | Status: DC
Start: 2024-02-27 — End: 2024-07-10

## 2024-02-27 NOTE — Addendum Note (Signed)
 Addended by: Tiffany Kocher on: 02/27/2024 12:53 PM   Modules accepted: Orders

## 2024-02-27 NOTE — Telephone Encounter (Signed)
 Refill request received via fax from [facility] for Cholestyramine powder. Pt was last seen on 01/09/24.

## 2024-02-29 ENCOUNTER — Ambulatory Visit (HOSPITAL_COMMUNITY): Attending: Cardiovascular Disease

## 2024-02-29 DIAGNOSIS — I5032 Chronic diastolic (congestive) heart failure: Secondary | ICD-10-CM | POA: Insufficient documentation

## 2024-02-29 DIAGNOSIS — I5030 Unspecified diastolic (congestive) heart failure: Secondary | ICD-10-CM | POA: Insufficient documentation

## 2024-02-29 DIAGNOSIS — I493 Ventricular premature depolarization: Secondary | ICD-10-CM | POA: Diagnosis not present

## 2024-02-29 DIAGNOSIS — I517 Cardiomegaly: Secondary | ICD-10-CM | POA: Insufficient documentation

## 2024-02-29 DIAGNOSIS — I10 Essential (primary) hypertension: Secondary | ICD-10-CM | POA: Diagnosis not present

## 2024-02-29 LAB — ECHOCARDIOGRAM COMPLETE
Area-P 1/2: 2.91 cm2
S' Lateral: 2.5 cm

## 2024-03-02 ENCOUNTER — Encounter: Payer: Self-pay | Admitting: Internal Medicine

## 2024-03-02 ENCOUNTER — Telehealth: Payer: Self-pay | Admitting: Internal Medicine

## 2024-03-02 NOTE — Telephone Encounter (Signed)
  Patient would like to discuss her echo results

## 2024-03-05 NOTE — Telephone Encounter (Signed)
 Pt returning call

## 2024-03-05 NOTE — Telephone Encounter (Signed)
 Debria Fang, PA-C 03/02/2024 10:17 AM EDT     Please tell patient that her echocardiogram shows EF (pumping function of the heart) was normal at 60-65%. No regional wall motion abnormalities and the LV relaxes normally. The RV also functions normally. There were no significant valvular abnormalities. Overall, normal echo   Thanks! KJ   Patient identification verified by 2 forms. Hilton Lucky, RN    Called and spoke to patient  Relayed provider result message  Patient states:   -she reviewed Echo report and does not agree with it being normal/not concerning   -continues to have elevated heart rate   -sent Monitor back on 02/27/24  -would like OV with Dr. Paulita Boss  Advised patient to schedule OV after monitor results become available  Patient requests next available OV  Patient scheduled for 4/21 at 1:40pm with Dr. Paulita Boss per request  Patient has no other questions at this time

## 2024-03-05 NOTE — Telephone Encounter (Signed)
 Left message for pt to call.

## 2024-03-06 DIAGNOSIS — I493 Ventricular premature depolarization: Secondary | ICD-10-CM

## 2024-03-07 ENCOUNTER — Telehealth: Payer: Self-pay

## 2024-03-07 NOTE — Telephone Encounter (Signed)
 Called patient advised of below they verbalized understanding Patient had an reaction to this monitor aswell

## 2024-03-07 NOTE — Telephone Encounter (Signed)
-----   Message from Debria Fang sent at 03/07/2024 12:08 PM EDT ----- Please tell patient that her monitor showed predominantly normal sinus rhythm with an average HR of 87 BPM. There was one short run of SVT (fast heart rate), but it only lasted 7 beats. There were occasional. PVCs (extra heart beats from the bottom of the heart), but these only made up about 2% of all beats. Your symptoms correlated with sinus rhythm, sinus tachycardia (fast HR), PVCs.  Overall, reassuring monitor   Continue current medications, follow up as arranged

## 2024-03-12 ENCOUNTER — Ambulatory Visit: Attending: Internal Medicine | Admitting: Internal Medicine

## 2024-03-12 VITALS — BP 102/68 | HR 86 | Ht 67.5 in | Wt 203.0 lb

## 2024-03-12 DIAGNOSIS — R072 Precordial pain: Secondary | ICD-10-CM

## 2024-03-12 DIAGNOSIS — I493 Ventricular premature depolarization: Secondary | ICD-10-CM

## 2024-03-12 DIAGNOSIS — I471 Supraventricular tachycardia, unspecified: Secondary | ICD-10-CM | POA: Insufficient documentation

## 2024-03-12 MED ORDER — ATENOLOL 25 MG PO TABS: 25.0000 mg | ORAL_TABLET | Freq: Every day | ORAL | 2 refills | Status: DC

## 2024-03-12 MED ORDER — ATENOLOL 25 MG PO TABS: 50.0000 mg | ORAL_TABLET | Freq: Every day | ORAL | 2 refills | Status: DC

## 2024-03-12 NOTE — Progress Notes (Signed)
 Cardiology Office Note:  .    Date:  03/12/2024  ID:  Natasha Wright, DOB 06-Oct-1961, MRN [id number] PCP: Kathyleen Parkins, MD  [facility] Providers Cardiologist:  Jann Melody, MD     CC: F/u SVT  History of Present Illness: .    Natasha Wright is a 63 y.o. female Natasha Wright is a 63 year old female with supraventricular tachycardia who presents with episodes of elevated heart rate and chest pain. She was referred by Dr. Alonza Jansky from [facility] for evaluation of her cardiac symptoms.  She experiences episodes of elevated heart rate, with a recent episode reaching 163 beats per minute lasting for two hours approximately three weeks ago. During this episode, she felt faint, dizzy, experienced cold sweats, and mild nausea. The following morning, her heart rate remained elevated at 120 beats per minute while sitting. Despite doubling her beta blocker dose from 25 mg to 50 mg, she experienced another similar episode two weeks later at the same time of day, which caused significant blood pressure drop.  She describes a sensation of 'an elephant sitting on her chest' following these episodes. She has three heart monitors at home and has been informed that her echocardiogram and heart monitor results were normal, although she insists that her mitral valve is abnormal. She has a family history of cardiac issues, with her son having experienced a heart rate of 200 beats per minute and undergoing multiple ablations.  She has a history of PTSD and anxiety. She wakes up in the middle of the night with chest pain unrelated to her heart rate and experiences shortness of breath and chest pain when walking or exerting herself.  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 She also has a history of restrictive lung disease, likely due to COVID-19, and scar tissue in her lungs, contributing to her shortness of breath.  She has bowel acid malabsorption and has  switched from pills to powder medication due to difficulty swallowing. 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 She has postponed procedures for esophageal stretching and epidural shots due to her elevated heart rate during pre-op evaluations.   Relevant histories: .  Social - son had congenital arrhythmia issues and sees Dr. Corinne Dicker ([id number]) ROS: As per HPI.   Studies Reviewed: .   Cardiac Studies & Procedures   ______________________________________________________________________________________________   STRESS TESTS  MYOCARDIAL PERFUSION IMAGING 06/05/2020  Narrative  There was no ST segment deviation noted during stress.  Nuclear stress EF: 53%. The left ventricular ejection fraction is normal (55-65%). Visually, the EF is higher than the computer calculated 53%.  This is a low risk study. There is no evidence of ischemia or previous infarction .  The study is normal.   ECHOCARDIOGRAM  ECHOCARDIOGRAM COMPLETE 02/29/2024  Narrative ECHOCARDIOGRAM REPORT    Patient Name:   Natasha Wright Date of Exam: 02/29/2024 Medical Rec #:  [id number]        Height:       68.0 in Accession #:    [id number]       Weight:       190.0 lb Date of Birth:  05/03/1961       BSA:          2.000 m Patient Age:    62 years  BP:           124/72 mmHg Patient Gender: F                HR:           76 bpm. Exam Location:  [address]  Procedure: 2D Echo, Cardiac Doppler and Color Doppler (Both Spectral and Color Flow Doppler were utilized during procedure).  Indications:    R00.2 Palpitations  History:        Patient has prior history of Echocardiogram examinations, most recent 03/29/2022. CHF and LVH, Arrythmias:PVC, Signs/Symptoms:Palpitations; Risk Factors:Hypertension and Former Smoker.  Sonographer:    Lula Sale  RDCS Referring Phys: [id number] Debria Fang  IMPRESSIONS   1. Left ventricular ejection fraction, by estimation, is 60 to 65%. The left ventricle has normal function. The left ventricle has no regional wall motion abnormalities. Left ventricular diastolic parameters were normal. 2. Right ventricular systolic function is normal. The right ventricular size is normal. 3. Left atrial size was mildly dilated. 4. The mitral valve is abnormal. Trivial mitral valve regurgitation. No evidence of mitral stenosis. 5. The aortic valve was not well visualized. Aortic valve regurgitation is not visualized. No aortic stenosis is present. 6. The inferior vena cava is normal in size with greater than 50% respiratory variability, suggesting right atrial pressure of 3 mmHg.  FINDINGS Left Ventricle: Left ventricular ejection fraction, by estimation, is 60 to 65%. The left ventricle has normal function. The left ventricle has no regional wall motion abnormalities. Strain was performed and the global longitudinal strain is indeterminate. The left ventricular internal cavity size was normal in size. There is no left ventricular hypertrophy. Left ventricular diastolic parameters were normal.  Right Ventricle: The right ventricular size is normal. No increase in right ventricular wall thickness. Right ventricular systolic function is normal.  Left Atrium: Left atrial size was mildly dilated.  Right Atrium: Right atrial size was normal in size.  Pericardium: There is no evidence of pericardial effusion.  Mitral Valve: The mitral valve is abnormal. There is mild thickening of the mitral valve leaflet(s). There is mild calcification of the mitral valve leaflet(s). Mild mitral annular calcification. Trivial mitral valve regurgitation. No evidence of mitral valve stenosis.  Tricuspid Valve: The tricuspid valve is normal in structure. Tricuspid valve regurgitation is not demonstrated. No evidence of tricuspid  stenosis.  Aortic Valve: The aortic valve was not well visualized. Aortic valve regurgitation is not visualized. No aortic stenosis is present.  Pulmonic Valve: The pulmonic valve was normal in structure. Pulmonic valve regurgitation is not visualized. No evidence of pulmonic stenosis.  Aorta: The aortic root is normal in size and structure.  Venous: The inferior vena cava is normal in size with greater than 50% respiratory variability, suggesting right atrial pressure of 3 mmHg.  IAS/Shunts: No atrial level shunt detected by color flow Doppler.  Additional Comments: 3D was performed not requiring image post processing on an independent workstation and was indeterminate.   LEFT VENTRICLE PLAX 2D LVIDd:         4.00 cm   Diastology LVIDs:         2.50 cm   LV e' medial:    9.46 cm/s LV PW:         1.10 cm   LV E/e' medial:  9.1 LV IVS:        0.90 cm   LV e' lateral:   9.14 cm/s LVOT diam:     2.00 cm   LV  E/e' lateral: 9.5 LV SV:         54 LV SV Index:   27 LVOT Area:     3.14 cm   RIGHT VENTRICLE             IVC RV S prime:     11.30 cm/s  IVC diam: 1.10 cm TAPSE (M-mode): 2.1 cm  LEFT ATRIUM             Index        RIGHT ATRIUM           Index LA diam:        3.90 cm 1.95 cm/m   RA Pressure: 3.00 mmHg LA Vol (A2C):   41.8 ml 20.90 ml/m  RA Area:     13.60 cm LA Vol (A4C):   38.7 ml 19.35 ml/m  RA Volume:   32.70 ml  16.35 ml/m LA Biplane Vol: 42.3 ml 21.15 ml/m AORTIC VALVE LVOT Vmax:   79.00 cm/s LVOT Vmean:  53.000 cm/s LVOT VTI:    0.173 m  AORTA Ao Root diam: 3.20 cm Ao Asc diam:  2.80 cm  MITRAL VALVE               TRICUSPID VALVE MV Area (PHT): 2.91 cm    Estimated RAP:  3.00 mmHg MV Decel Time: 261 msec MV E velocity: 86.50 cm/s  SHUNTS MV A velocity: 99.00 cm/s  Systemic VTI:  0.17 m MV E/A ratio:  0.87        Systemic Diam: 2.00 cm  Janelle Mediate MD Electronically signed by Janelle Mediate MD Signature Date/Time: 02/29/2024/8:23:10  AM    Final    MONITORS  LONG TERM MONITOR (3-14 DAYS) 03/05/2024  Narrative   Patient had a minimum heart rate of 56 bpm, maximum heart rate of 149 bpm, and average heart rate of 87 bpm. Predominant underlying rhythm was sinus rhythm. One run of SVT- 149 bpm, seven beats long. Isolated PACs were rare (<1.0%). Isolated PVCs were occasional (2.0 %). No evidence of significant heart block . Triggered and diary events associated with sinus rhythm, sinus tachycardia, and PVC. There is not a correlation between patient triggers and relative increase in PVC burden.  No malignant arrhythmias.   CT SCANS  CT CORONARY MORPH W/CTA COR W/SCORE 09/30/2022  Addendum 09/30/2022  2:29 PM ADDENDUM REPORT: 09/30/2022 14:27  EXAM: OVER-READ INTERPRETATION  CT CHEST  The following report is an over-read performed by radiologist Dr. Collie Days [facility] on 09/30/2022. This over-read does not include interpretation of cardiac or coronary anatomy or pathology. The coronary CTA interpretation by the cardiologist is attached.  COMPARISON:  None.  FINDINGS: The visualized portions of the extracardiac vascular structures are unremarkable. Visualized mediastinum is unremarkable. Visualized portion of upper abdomen is unremarkable. Visualized pulmonary parenchyma is unremarkable. Visualized skeleton is unremarkable.  IMPRESSION: No definite abnormality seen involving the visualized extracardiac structures of the chest.   Electronically Signed By: Rosalene Colon M.D. On: 09/30/2022 14:27  Narrative CLINICAL DATA:  63 yo female with chest pain  EXAM: Cardiac/Coronary CTA  TECHNIQUE: A non-contrast, gated CT scan was obtained with axial slices of 3 mm through the heart for calcium scoring. Calcium scoring was performed using the Agatston method. A 120 kV prospective, gated, contrast cardiac scan was obtained. Gantry rotation speed was 250 msecs and collimation  was 0.6 mm. Two sublingual nitroglycerin  tablets (0.8 mg) were given. The 3D data set was reconstructed in 5%  intervals of the 35-75% of the R-R cycle. Diastolic phases were analyzed on a dedicated workstation using MPR, MIP, and VRT modes. The patient received 95 cc of contrast.  FINDINGS: Image quality: Excellent.  Noise artifact is: Limited.  Coronary Arteries:  Normal coronary origin.  Right dominance.  Left main: The left main is a large caliber vessel with a normal take off from the left coronary cusp that bifurcates to form a left anterior descending artery and a left circumflex artery. There is no plaque or stenosis.  Left anterior descending artery: The LAD is patent without evidence of plaque or stenosis. The LAD gives off 1 patent diagonal branch.  Left circumflex artery: The LCX is non-dominant and patent with no evidence of plaque or stenosis. The LCX gives off 1 patent obtuse marginal branch.  Right coronary artery: The RCA is dominant with normal take off from the right coronary cusp. There is no evidence of plaque or stenosis. The RCA terminates as a PDA and right posterolateral branch without evidence of plaque or stenosis.  Right Atrium: Right atrial size is within normal limits.  Right Ventricle: The right ventricular cavity is within normal limits.  Left Atrium: Left atrial size is normal in size with no left atrial appendage filling defect.  Left Ventricle: The ventricular cavity size is within normal limits. There are no stigmata of prior infarction. There is no abnormal filling defect.  Pulmonary arteries: Normal in size without proximal filling defect.  Pulmonary veins: Normal pulmonary venous drainage.  Pericardium: Normal thickness with no significant effusion or calcium present.  Cardiac valves: The aortic valve is trileaflet without significant calcification. The mitral valve is normal structure without significant  calcification.  Aorta: Normal caliber with no significant disease.  Extra-cardiac findings: See attached radiology report for non-cardiac structures.  IMPRESSION: 1. Coronary calcium score of 0. This was 0 percentile for age-, sex, and race-matched controls.  2. Normal coronary origin with right dominance.  3. No evidence of CAD.  RECOMMENDATIONS: 1 CAD-RADS 0: No evidence of CAD (0%). Consider non-atherosclerotic causes of chest pain.  Alexandria Angel, MD  Electronically Signed: By: Alexandria Angel M.D. On: 09/30/2022 14:12     ______________________________________________________________________________________________       Physical Exam:    VS:  BP 102/68 (BP Location: Right Arm)   Pulse 86   Ht 5' 7.5" (1.715 m)   Wt 92.1 kg   SpO2 95%   BMI 31.33 kg/m    Wt Readings from Last 3 Encounters:  03/12/24 92.1 kg  02/22/24 86.2 kg  02/07/24 92 kg    Gen: No distress  Neck: No JVD Cardiac: No Rubs or Gallops, no murmur, RRR +2 radial pulses Respiratory: Clear to auscultation bilaterally, normal effort, normal  respiratory rate GI: Soft, nontender, non-distended  MS: No pitting edema;  moves all extremities Integument: Skin feels warm, dog scratch is healing R leg Neuro:  At time of evaluation, alert and oriented to person/place/time/situation  Psych: Tense affect, patient feels upset   ASSESSMENT AND PLAN: .    Supraventricular tachycardia (SVT) Intermittent episodes with heart rates up to 163 bpm, causing dizziness, faintness, cold sweats, and nausea. Increased atenolol  was ineffective due to hypotension. SVT is not life-threatening and does not increase stroke risk. Flecainide  considered if no significant coronary artery blockage is found. Informed consent for nuclear medicine stress test obtained. - Reduce atenolol  to 25 mg. - Order nuclear medicine stress test to evaluate for coronary artery blockage. - If stress test  is negative, initiate flecainide  with EKG follow-up.  Chest pain Intermittent chest pain, sometimes unrelated to SVT. Differential includes cardiac causes and esophageal dysmotility. Need to rule out significant coronary artery disease. - Order nuclear medicine stress test to evaluate for coronary artery blockage.  Mitral valve prolapse Mitral valve prolapse with no significant regurgitation. May be related to PVCs, but no MAD  Restrictive lung disease Scar tissue in lungs, likely secondary to COVID-19, contributing to shortness of breath. Managed by pulmonology  Esophageal dysphagia Difficulty swallowing due to esophageal dysmotility, requiring esophageal stretching. May contribute to chest discomfort. - if normal NM stress; she should proceed with dilation  Bile acid malabsorption Managed with medication. Switched from pills to powder due to swallowing difficulties.  Should not affect cardiac therapy  Peripheral neuropathy Causing burning sensation in feet, impacting mobility (Lexiscan  recommended)  Follow-up Appointment scheduled for July 11th  Time Spent Directly with Patient:   I have spent a total of 41 minutes with the patient reviewing notes, imaging, EKGs, labs, examining the patient as well as establishing an assessment and plan that was discussed personally with the patient. Reviewed her concerns with [facility] and we will work to find new ways to support her and respect her concerns.   Gloriann Larger, MD FASE [facility] Cardiologist [facility]  [address] [place], [address] [place], [place] [address] [phone]  2:06 PM

## 2024-03-12 NOTE — Patient Instructions (Addendum)
 Medication Instructions:  DECREASE Atenolol  to 25 mg take one tablet by mouth daily   *If you need a refill on your cardiac medications before your next appointment, please call your pharmacy*  Testing/Procedures: Lexiscan    Your physician has requested that you have a lexiscan  myoview . For further information please visit [email or web address] Please follow instruction sheet, as given.   Follow-Up: At [facility], you and your health needs are our priority.  As part of our continuing mission to provide you with exceptional heart care, our providers are all part of one team.  This team includes your primary Cardiologist (physician) and Advanced Practice Providers or APPs (Physician Assistants and Nurse Practitioners) who all work together to provide you with the care you need, when you need it.  Your next appointment:   Keep upcoming appointment on 06/01/24   Provider:   Jann Melody, MD     We recommend signing up for the patient portal called "MyChart".  Sign up information is provided on this After Visit Summary.  MyChart is used to connect with patients for Virtual Visits (Telemedicine).  Patients are able to view lab/test results, encounter notes, upcoming appointments, etc.  Non-urgent messages can be sent to your provider as well.   To learn more about what you can do with MyChart, go to [email or web address].   Other Instructions       1st Floor: - Lobby - Registration  - Pharmacy  - Lab - Cafe  2nd Floor: - PV Lab - Diagnostic Testing (echo, CT, nuclear med)  3rd Floor: - Vacant  4th Floor: - TCTS (cardiothoracic surgery) - AFib Clinic - Structural Heart Clinic - Vascular Surgery  - Vascular Ultrasound  5th Floor: - [facility]Care Cardiology (general and EP) - Clinical Pharmacy for coumadin, hypertension, lipid, weight-loss medications, and med management appointments    Valet parking services will be available as well.

## 2024-03-13 ENCOUNTER — Encounter (HOSPITAL_COMMUNITY): Payer: Self-pay

## 2024-03-20 ENCOUNTER — Telehealth: Payer: Self-pay | Admitting: *Deleted

## 2024-03-20 NOTE — Telephone Encounter (Signed)
 Left detailed message with instructions for MPI study.

## 2024-03-21 ENCOUNTER — Other Ambulatory Visit: Payer: Self-pay | Admitting: Internal Medicine

## 2024-03-21 DIAGNOSIS — I471 Supraventricular tachycardia, unspecified: Secondary | ICD-10-CM

## 2024-03-21 DIAGNOSIS — R072 Precordial pain: Secondary | ICD-10-CM

## 2024-03-22 ENCOUNTER — Ambulatory Visit (HOSPITAL_COMMUNITY): Attending: Cardiology

## 2024-03-22 DIAGNOSIS — R072 Precordial pain: Secondary | ICD-10-CM | POA: Insufficient documentation

## 2024-03-22 DIAGNOSIS — R918 Other nonspecific abnormal finding of lung field: Secondary | ICD-10-CM | POA: Diagnosis not present

## 2024-03-22 DIAGNOSIS — I471 Supraventricular tachycardia, unspecified: Secondary | ICD-10-CM | POA: Diagnosis not present

## 2024-03-22 DIAGNOSIS — J9811 Atelectasis: Secondary | ICD-10-CM | POA: Diagnosis not present

## 2024-03-22 MED ORDER — REGADENOSON 0.4 MG/5ML IV SOLN
0.4000 mg | Freq: Once | INTRAVENOUS | Status: AC
  Administered 2024-03-22: 0.4 mg via INTRAVENOUS

## 2024-03-22 MED ORDER — TECHNETIUM TC 99M TETROFOSMIN IV KIT
24.2000 | PACK | Freq: Once | INTRAVENOUS | Status: AC | PRN
  Administered 2024-03-22: 24.2 via INTRAVENOUS

## 2024-03-22 MED ORDER — REGADENOSON 0.4 MG/5ML IV SOLN
INTRAVENOUS | Status: AC
  Filled 2024-03-22: qty 5

## 2024-03-22 MED ORDER — TECHNETIUM TC 99M TETROFOSMIN IV KIT
7.9000 | PACK | Freq: Once | INTRAVENOUS | Status: AC | PRN
  Administered 2024-03-22: 7.9 via INTRAVENOUS

## 2024-03-26 LAB — MYOCARDIAL PERFUSION IMAGING
LV dias vol: 76 mL (ref 46–106)
LV sys vol: 28 mL
Nuc Stress EF: 63 %
Peak HR: 83 {beats}/min
Rest HR: 64 {beats}/min
Rest Nuclear Isotope Dose: 7.9 mCi
SDS: 1
SRS: 6
SSS: 5
ST Depression (mm): 0 mm
Stress Nuclear Isotope Dose: 24.2 mCi
TID: 0.94

## 2024-03-27 ENCOUNTER — Encounter: Payer: Self-pay | Admitting: Internal Medicine

## 2024-03-27 DIAGNOSIS — Z79899 Other long term (current) drug therapy: Secondary | ICD-10-CM

## 2024-03-27 DIAGNOSIS — I471 Supraventricular tachycardia, unspecified: Secondary | ICD-10-CM

## 2024-03-27 DIAGNOSIS — R072 Precordial pain: Secondary | ICD-10-CM

## 2024-03-28 MED ORDER — FLECAINIDE ACETATE 50 MG PO TABS: 50.0000 mg | ORAL_TABLET | Freq: Two times a day (BID) | ORAL | 1 refills | Status: DC

## 2024-03-28 NOTE — Telephone Encounter (Signed)
 The patient has been notified of the result and verbalized understanding.  All questions (if any) were answered. Christine Cozier, RN 03/28/2024 2:54 PM  Pt will have EKG completed in [place] office.   EKG scheduled for 04/04/24 at 3:45 pm. FLP orders placed and released for future draw.

## 2024-03-28 NOTE — Telephone Encounter (Signed)
-----   Message from Jann Melody sent at 03/27/2024 11:59 AM EDT ----- Results: No CAC, negative stress test, normal function Aortic atherosclerosis incidentally found Paroxsymal SVT;  unable to tolerate higher dose AV nodal agents due to symptomatic hypotension  Plan: Flecainide start - on current AV nodal agent - will start flecainide 50 mg PO BID - will get EKG in one week - will do treadmill stress test in one month - Fasting lipids when we do EKG  (EKG and lab testing can be done closer to home)  - results to PCP  Jann Melody, MD

## 2024-03-28 NOTE — Addendum Note (Signed)
 Addended by: Christine Cozier on: 03/28/2024 03:23 PM   Modules accepted: Orders

## 2024-04-02 ENCOUNTER — Other Ambulatory Visit: Payer: Self-pay

## 2024-04-02 MED ORDER — FLECAINIDE ACETATE 50 MG PO TABS: 50.0000 mg | ORAL_TABLET | Freq: Two times a day (BID) | ORAL | 3 refills | Status: DC

## 2024-04-04 ENCOUNTER — Ambulatory Visit: Attending: Cardiology | Admitting: *Deleted

## 2024-04-04 DIAGNOSIS — I471 Supraventricular tachycardia, unspecified: Secondary | ICD-10-CM | POA: Diagnosis not present

## 2024-04-04 DIAGNOSIS — Z79899 Other long term (current) drug therapy: Secondary | ICD-10-CM | POA: Diagnosis not present

## 2024-04-04 NOTE — Progress Notes (Signed)
 Pt states that after starting flecainide  she has been more fatigue.

## 2024-04-05 ENCOUNTER — Other Ambulatory Visit (HOSPITAL_COMMUNITY)
Admission: RE | Admit: 2024-04-05 | Discharge: 2024-04-05 | Disposition: A | Discharge status: Home / Self Care | Source: Ambulatory Visit | Attending: Internal Medicine | Admitting: Internal Medicine

## 2024-04-05 ENCOUNTER — Telehealth: Payer: Self-pay | Admitting: Internal Medicine

## 2024-04-05 ENCOUNTER — Ambulatory Visit: Payer: Self-pay

## 2024-04-05 DIAGNOSIS — Z79899 Other long term (current) drug therapy: Secondary | ICD-10-CM

## 2024-04-05 DIAGNOSIS — R072 Precordial pain: Secondary | ICD-10-CM | POA: Diagnosis not present

## 2024-04-05 DIAGNOSIS — I471 Supraventricular tachycardia, unspecified: Secondary | ICD-10-CM

## 2024-04-05 LAB — LIPID PANEL
Cholesterol: 180 mg/dL (ref 0–200)
HDL: 41 mg/dL (ref >40)
LDL Cholesterol: 112 mg/dL — ABNORMAL HIGH (ref 0–99)
Total CHOL/HDL Ratio: 4.4 ratio
Triglycerides: 136 mg/dL (ref <150)
VLDL: 27 mg/dL (ref 0–40)

## 2024-04-05 NOTE — Telephone Encounter (Signed)
 error

## 2024-04-10 NOTE — Telephone Encounter (Signed)
 Reached out to [facility] scheduling in regards to requested GXT.  Was told by scheduler pt can not walk on treadmill d/t bolts and screws in her body and swollen knee.   Spoke with provider (Dr. Paulita Boss) reports pt recently had a NMST will cancel testing.  EKG post flecainide  WNL per MD. I have canceled testing.  Called pt to advise.  Pt reports started Flecainide  50 mg on May 9 continues to have HR 160's about every other day.  This has improved from every day.  HR jumps up with activities such as working in garden or going to [employer].  BP run 110/70-120/80.   Spoke with MD; advised pt should have a referral to EP.  Pt would like referral sent to Dr. Oleta Bernhardt with [facility].  Pt son has seen this provider for many years and she really trust him.   Advised pt I will place referral and Dr. Ole [place] office will reach out to her to schedule OV.  All questions answered.

## 2024-04-10 NOTE — Addendum Note (Signed)
 Addended by: Christine Cozier on: 04/10/2024 11:40 AM   Modules accepted: Orders

## 2024-04-11 ENCOUNTER — Ambulatory Visit: Payer: Self-pay

## 2024-04-17 ENCOUNTER — Other Ambulatory Visit: Payer: Self-pay | Admitting: Internal Medicine

## 2024-04-17 DIAGNOSIS — Z1231 Encounter for screening mammogram for malignant neoplasm of breast: Secondary | ICD-10-CM

## 2024-04-24 DIAGNOSIS — I872 Venous insufficiency (chronic) (peripheral): Secondary | ICD-10-CM | POA: Diagnosis not present

## 2024-04-24 DIAGNOSIS — G894 Chronic pain syndrome: Secondary | ICD-10-CM | POA: Diagnosis not present

## 2024-04-24 DIAGNOSIS — M1990 Unspecified osteoarthritis, unspecified site: Secondary | ICD-10-CM | POA: Diagnosis not present

## 2024-04-27 ENCOUNTER — Encounter

## 2024-04-27 DIAGNOSIS — Z1231 Encounter for screening mammogram for malignant neoplasm of breast: Secondary | ICD-10-CM

## 2024-05-04 ENCOUNTER — Ambulatory Visit
Admission: RE | Admit: 2024-05-04 | Discharge: 2024-05-04 | Disposition: A | Discharge status: Home / Self Care | Source: Ambulatory Visit | Attending: Internal Medicine | Admitting: Internal Medicine

## 2024-05-04 DIAGNOSIS — Z1231 Encounter for screening mammogram for malignant neoplasm of breast: Secondary | ICD-10-CM | POA: Diagnosis not present

## 2024-05-14 DIAGNOSIS — R208 Other disturbances of skin sensation: Secondary | ICD-10-CM | POA: Diagnosis not present

## 2024-05-14 DIAGNOSIS — D518 Other vitamin B12 deficiency anemias: Secondary | ICD-10-CM | POA: Diagnosis not present

## 2024-05-14 DIAGNOSIS — J0100 Acute maxillary sinusitis, unspecified: Secondary | ICD-10-CM | POA: Diagnosis not present

## 2024-05-14 DIAGNOSIS — R0600 Dyspnea, unspecified: Secondary | ICD-10-CM | POA: Diagnosis not present

## 2024-05-14 DIAGNOSIS — J209 Acute bronchitis, unspecified: Secondary | ICD-10-CM | POA: Diagnosis not present

## 2024-05-14 DIAGNOSIS — R9389 Abnormal findings on diagnostic imaging of other specified body structures: Secondary | ICD-10-CM | POA: Diagnosis not present

## 2024-05-14 DIAGNOSIS — R1310 Dysphagia, unspecified: Secondary | ICD-10-CM | POA: Diagnosis not present

## 2024-05-14 DIAGNOSIS — N3281 Overactive bladder: Secondary | ICD-10-CM | POA: Diagnosis not present

## 2024-05-14 DIAGNOSIS — G9332 Myalgic encephalomyelitis/chronic fatigue syndrome: Secondary | ICD-10-CM | POA: Diagnosis not present

## 2024-05-14 DIAGNOSIS — R600 Localized edema: Secondary | ICD-10-CM | POA: Diagnosis not present

## 2024-05-21 ENCOUNTER — Encounter: Payer: Self-pay | Admitting: Obstetrics & Gynecology

## 2024-05-21 ENCOUNTER — Ambulatory Visit: Admitting: Obstetrics & Gynecology

## 2024-05-21 VITALS — BP 113/67 | HR 78 | Ht 67.5 in | Wt 200.0 lb

## 2024-05-21 DIAGNOSIS — N816 Rectocele: Secondary | ICD-10-CM | POA: Diagnosis not present

## 2024-05-21 DIAGNOSIS — N3946 Mixed incontinence: Secondary | ICD-10-CM | POA: Diagnosis not present

## 2024-05-21 DIAGNOSIS — N8110 Cystocele, unspecified: Secondary | ICD-10-CM | POA: Diagnosis not present

## 2024-05-21 MED ORDER — SOLIFENACIN SUCCINATE 10 MG PO TABS: 10.0000 mg | ORAL_TABLET | Freq: Every day | ORAL | 11 refills | Status: DC

## 2024-05-21 NOTE — Progress Notes (Signed)
 Chief Complaint  Patient presents with   possible bladder prolapse      63 y.o. G1P0 No LMP recorded. Patient has had a hysterectomy. The current method of family planning is status post hysterectomy.  Outpatient Encounter Medications as of 05/21/2024  Medication Sig   acetaminophen (TYLENOL) 650 MG CR tablet Take 1,300 mg by mouth 2 (two) times daily as needed for pain.   atenolol  (TENORMIN ) 25 MG tablet Take 1 tablet (25 mg total) by mouth daily.   azelastine (ASTELIN) 0.1 % nasal spray as needed for allergies.   budesonide (RHINOCORT AQUA) 32 MCG/ACT nasal spray as needed for allergies.   cholestyramine  (QUESTRAN ) 4 g packet Take 1 packet (4 g total) by mouth daily. Separate from other medications by 2 hours.   citalopram  (CELEXA ) 20 MG tablet Take 1 tablet (20 mg total) by mouth 2 (two) times daily.   dicyclomine (BENTYL) 20 MG tablet Take 20 mg by mouth 4 (four) times daily as needed for spasms. Rarely takes.   estradiol (ESTRACE) 0.5 MG tablet Take 0.5 mg by mouth at bedtime.    flecainide  (TAMBOCOR ) 50 MG tablet Take 1 tablet (50 mg total) by mouth 2 (two) times daily.   fluticasone (FLONASE) 50 MCG/ACT nasal spray Place 1 spray into both nostrils 2 (two) times daily.   furosemide  (LASIX ) 20 MG tablet Take 20 mg by mouth daily.   ipratropium (ATROVENT) 0.06 % nasal spray Place 2 sprays into both nostrils daily.   mirtazapine (REMERON) 15 MG tablet Take 15 mg by mouth at bedtime.   mometasone  (NASONEX ) 50 MCG/ACT nasal spray Place 2 sprays into the nose daily.   Multiple Vitamin (MULTIVITAMIN WITH MINERALS) TABS tablet Take 1 tablet by mouth daily.   mupirocin ointment (BACTROBAN) 2 % Apply topically.   nortriptyline (PAMELOR) 10 MG capsule Take 10 mg by mouth 3 (three) times daily.   oxybutynin (DITROPAN) 5 MG tablet Take 5 mg by mouth 2 (two) times daily.   pantoprazole  (PROTONIX ) 40 MG tablet TAKE 1 TABLET BY MOUTH 30 MINUTES PRIOR TO MEALS TWICE DAILY   rosuvastatin  (CRESTOR) 10 MG tablet Take 10 mg by mouth at bedtime.   solifenacin (VESICARE) 10 MG tablet Take 1 tablet (10 mg total) by mouth daily. At bedtime   spironolactone  (ALDACTONE ) 25 MG tablet TAKE 1 TABLET BY MOUTH DAILY   No facility-administered encounter medications on file as of 05/21/2024.    Subjective Patient presents today giving a history of a cystocele and rectocele diagnosed several years ago  She states that she was lifting a bag of soil off of her foot and right after that she noted a much larger bulge than she had noted before Her interpretation was this was her bladder She does have longstanding history worsening of urinary incontinence History defined that is mixed with loss of urine with coughing sneezing and pressure as well as inability to make it to the bathroom on time especially in the morning and the need to double void She is currently on Ditropan 5 mg twice a day for that She had a hysterectomy in 1991 and is on Estrace 0.5 mg daily Past Medical History:  Diagnosis Date   Anxiety    Arthritis    B12 deficiency    h/o   Family history of heart disease    GERD (gastroesophageal reflux disease)    Hypoglycemia    IBS (irritable bowel syndrome)    Lumbar disc disease  OA (osteoarthritis)    PONV (postoperative nausea and vomiting)    needs scop patch   Rectocele    Shingles    Spinal stenosis in cervical region     Past Surgical History:  Procedure Laterality Date   ABDOMINAL HYSTERECTOMY  1992   ARTHRODESIS FOOT WITH WEIL OSTEOTOMY Left 05/09/2014   Procedure: LEFT FIRST TARSAL METATARSAL  ARTHRODESIS; LEFT SECOND WEIL OSTEOTOMY;  Surgeon: Norleen Armor, MD;  Location: [facility];  Service: Orthopedics;  Laterality: Left;   BIOPSY  07/11/2018   Procedure: BIOPSY;  Surgeon: Harvey Margo CROME, MD;  [facility];  Service: Endoscopy;;  duodenal biopsy , gastric biopsy    BIOPSY  07/04/2023   Procedure: BIOPSY;  Surgeon: Cindie Carlin POUR, DO;  [facility];  Service: Endoscopy;;   BUNIONECTOMY WITH HAMMERTOE RECONSTRUCTION Left 05/09/2014   Procedure: LEFT MODIFIED MCBRIDE BUNIONECTOMY; LEFT SECOND HAMMER TOE CORRECTION;  Surgeon: Norleen Armor, MD;  Location: [facility];  Service: Orthopedics;  Laterality: Left;   CERVICAL SPINE SURGERY  2009   CHOLECYSTECTOMY  2004   lap choli   COLONOSCOPY  07/08/2003   RMR: Left-sided diverticula, remainder of colonic mucosa and terminal ileum appeared normal.. Minimally friable internal hemorrhoids, otherwise normal rectum   COLONOSCOPY N/A 04/30/2013   Dr. Harvey: mild diverticulosis in descending and sigmoid colon, internal hemorrhoids.    COLONOSCOPY WITH PROPOFOL  N/A 07/04/2023   Procedure: COLONOSCOPY WITH PROPOFOL ;  Surgeon: Cindie Carlin POUR, DO;  [facility];  Service: Endoscopy;  Laterality: N/A;  10:45 AM, ASA 3, pt knows to arrive at 8:00   DILATION AND CURETTAGE OF UTERUS     ESOPHAGOGASTRODUODENOSCOPY (EGD) WITH PROPOFOL  N/A 07/11/2018   Procedure: ESOPHAGOGASTRODUODENOSCOPY (EGD) WITH PROPOFOL ;  Surgeon: Harvey Margo CROME, MD;  [facility];  Service: Endoscopy;  Laterality: N/A;  9:30am   KNEE ARTHROSCOPY Right    NASAL SINUS SURGERY     NM MYOCAR PERF WALL MOTION  10/2011   bruce myoview  - breast attenuation noted in anterior region; EF 65%; no ischemia/infarct/scar; low risk   POLYPECTOMY  07/04/2023   Procedure: POLYPECTOMY;  Surgeon: Cindie Carlin POUR, DO;  [facility];  Service: Endoscopy;;   right ankle surgery     SAVORY DILATION N/A 07/11/2018   Procedure: SAVORY DILATION;  Surgeon: Harvey Margo CROME, MD;  [facility];  Service: Endoscopy;  Laterality: N/A;   SHOULDER SURGERY     TONSILLECTOMY  1980   TUBAL LIGATION      OB History     Gravida  1   Para      Term      Preterm      AB      Living         SAB      IAB      Ectopic      Multiple      Live Births               Allergies  Allergen Reactions   Penicillins Shortness Of Breath and Rash    Has patient had a PCN reaction causing immediate rash, facial/tongue/throat swelling, SOB or lightheadedness with hypotension: Yes Has patient had a PCN reaction causing severe rash involving mucus membranes or skin necrosis: No Has patient had a PCN reaction that required hospitalization Yes Has patient had a PCN reaction occurring within the last 10 years: No If all of the above  answers are NO, then may proceed with Cephalosporin use.    Sulfa Antibiotics Hives, Rash, Shortness Of Breath and Swelling   Sulfamethoxazole-Trimethoprim Shortness Of Breath and Rash   Aspirin      Other Reaction(s): Other  Stomach issues   Codeine Nausea Only and Other (See Comments)    skin crawling   Fentanyl  Nausea Only and Other (See Comments)    skin crawling, passed out   Ibuprofen     Other Reaction(s): Other  GI upset   Morphine Nausea Only and Other (See Comments)    skin crawling   Adhesive [Tape] Other (See Comments)    Rips off skin, paper tape is ok   Influenza Vaccines Rash   Latex Rash   Nsaids Nausea Only   Other Nausea Only    Social History   Socioeconomic History   Marital status: Married    Spouse name: Jayson   Number of children: 1   Years of education: 14   Highest education level: Not on file  Occupational History   Occupation: Unemployed    Employer: [employer]    Employer: UNEMPLOYED  Tobacco Use   Smoking status: Former    Current packs/day: 0.00    Average packs/day: 0.5 packs/day for 40.0 years (20.0 ttl pk-yrs)    Types: Cigarettes    Start date: 12/24/1973    Quit date: 12/24/2013    Years since quitting: 10.4   Smokeless tobacco: Never   Tobacco comments:    using vape as aid to sustain from smoking  Vaping Use   Vaping status: Never Used  Substance and Sexual Activity   Alcohol use: No   Drug use: No   Sexual activity: Not Currently    Birth  control/protection: None, Surgical    Comment: hysy  Other Topics Concern   Not on file  Social History Narrative   One son, Down syndrome,is right handed ,resides with son and husband   Right handed   Social Drivers of Health   Financial Resource Strain: Low Risk  (11/24/2023)   Received from [facility]   Overall Financial Resource Strain (CARDIA)    Difficulty of Paying Living Expenses: Not very hard  Food Insecurity: No Food Insecurity (11/24/2023)   Received from [facility]   Hunger Vital Sign    Within the past 12 months, you worried that your food would run out before you got the money to buy more.: Never true    Within the past 12 months, the food you bought just didn't last and you didn't have money to get more.: Never true  Transportation Needs: No Transportation Needs (11/24/2023)   Received from [facility] - Transportation    Lack of Transportation (Medical): No    Lack of Transportation (Non-Medical): No  Physical Activity: Insufficiently Active (11/24/2023)   Received from [facility]   Exercise Vital Sign    On average, how many days per week do you engage in moderate to strenuous exercise (like a brisk walk)?: 2 days    On average, how many minutes do you engage in exercise at this level?: 10 min  Stress: Stress Concern Present (11/24/2023)   Received from [facility] - Occupational Stress Questionnaire    Feeling of Stress : Very much  Social Connections: Somewhat Isolated (11/24/2023)   Received from [facility]   Social Network    How would you rate your social network (family, work, friends)?:  Restricted participation with some degree of social isolation    Family History  Problem Relation Age of Onset   Heart disease Paternal Grandfather    Stroke Paternal Grandmother    Diabetes Paternal Grandmother    Heart disease Paternal Grandmother    Breast cancer Maternal Grandmother    Hypertension  Maternal Grandmother    Diabetes Maternal Grandmother    Stroke Maternal Grandmother    Asthma Maternal Grandmother    Stroke Maternal Grandfather    Ulcerative colitis Father    Diabetes Mother    Hypertension Mother    Hyperlipidemia Mother    Alzheimer's disease Mother    Hyperlipidemia Brother    Hyperlipidemia Sister    Hypertension Sister    Diabetes Sister    Down syndrome Child    Heart disease Child    Colon cancer Neg Hx     Medications:       Current Outpatient Medications:    acetaminophen (TYLENOL) 650 MG CR tablet, Take 1,300 mg by mouth 2 (two) times daily as needed for pain., Disp: , Rfl:    atenolol  (TENORMIN ) 25 MG tablet, Take 1 tablet (25 mg total) by mouth daily., Disp: 90 tablet, Rfl: 2   azelastine (ASTELIN) 0.1 % nasal spray, as needed for allergies., Disp: , Rfl:    budesonide (RHINOCORT AQUA) 32 MCG/ACT nasal spray, as needed for allergies., Disp: , Rfl:    cholestyramine  (QUESTRAN ) 4 g packet, Take 1 packet (4 g total) by mouth daily. Separate from other medications by 2 hours., Disp: 90 each, Rfl: 3   citalopram  (CELEXA ) 20 MG tablet, Take 1 tablet (20 mg total) by mouth 2 (two) times daily., Disp: 180 tablet, Rfl: 3   dicyclomine (BENTYL) 20 MG tablet, Take 20 mg by mouth 4 (four) times daily as needed for spasms. Rarely takes., Disp: , Rfl:    estradiol (ESTRACE) 0.5 MG tablet, Take 0.5 mg by mouth at bedtime. , Disp: , Rfl:    flecainide  (TAMBOCOR ) 50 MG tablet, Take 1 tablet (50 mg total) by mouth 2 (two) times daily., Disp: 180 tablet, Rfl: 3   fluticasone (FLONASE) 50 MCG/ACT nasal spray, Place 1 spray into both nostrils 2 (two) times daily., Disp: , Rfl:    furosemide  (LASIX ) 20 MG tablet, Take 20 mg by mouth daily., Disp: , Rfl:    ipratropium (ATROVENT) 0.06 % nasal spray, Place 2 sprays into both nostrils daily., Disp: , Rfl:    mirtazapine (REMERON) 15 MG tablet, Take 15 mg by mouth at bedtime., Disp: , Rfl:    mometasone  (NASONEX ) 50 MCG/ACT  nasal spray, Place 2 sprays into the nose daily., Disp: 1 each, Rfl: 12   Multiple Vitamin (MULTIVITAMIN WITH MINERALS) TABS tablet, Take 1 tablet by mouth daily., Disp: , Rfl:    mupirocin ointment (BACTROBAN) 2 %, Apply topically., Disp: , Rfl:    nortriptyline (PAMELOR) 10 MG capsule, Take 10 mg by mouth 3 (three) times daily., Disp: , Rfl:    oxybutynin (DITROPAN) 5 MG tablet, Take 5 mg by mouth 2 (two) times daily., Disp: , Rfl:    pantoprazole  (PROTONIX ) 40 MG tablet, TAKE 1 TABLET BY MOUTH 30 MINUTES PRIOR TO MEALS TWICE DAILY, Disp: 60 tablet, Rfl: 11   rosuvastatin (CRESTOR) 10 MG tablet, Take 10 mg by mouth at bedtime., Disp: , Rfl:    solifenacin (VESICARE) 10 MG tablet, Take 1 tablet (10 mg total) by mouth daily. At bedtime, Disp: 30 tablet, Rfl: 11   spironolactone  (  ALDACTONE ) 25 MG tablet, TAKE 1 TABLET BY MOUTH DAILY, Disp: 100 tablet, Rfl: 3  Objective Blood pressure 113/67, pulse 78, height 5' 7.5 (1.715 m), weight 200 lb (90.7 kg).  General WDWN female NAD Vulva:  normal appearing vulva with no masses, tenderness or lesions Vagina:  moderate atrophy foreshortened Cervix: Absent Uterus: Absent Adnexa:   The patient really has a much more significant rectocele than cystocele Interestingly she does not have any vaginal apical descent it is well supported but somewhat foreshortened She has a normal amount of bladder neck mobility definitely not excessive I would rate her cystocele at no more than a grade 2 but she has a grade 3 rectocele I do not appreciate an enterocele today For the short-term because of her increased symptoms as of late I fitted her for Milex ring with support pessary #4 with good fit and good tolerance by the patient This will provide a good floor for the bladder and a good sealing for the rectum hopefully improving her urinary symptoms as well as her bulge pressure symptoms     Pertinent ROS No burning with urination, frequency or urgency No  nausea, vomiting or diarrhea Nor fever chills or other constitutional symptoms   Labs or studies No new    Impression + Management Plan: Diagnoses this Encounter::   ICD-10-CM   1. Rectocele: grade 3  N81.6     2. Baden-Walker grade 2 cystocele  N81.10     3. Mixed incontinence urge and stress  N39.46      The patient states that at some point in the future she would prefer surgical management but now her life does not really allow that and she is undergoing a cardiology evaluation as well  For the meantime we will proceed along managing this with a pessary and pharmacologically and save surgical decisions for down the road   Medications prescribed during  this encounter: Meds ordered this encounter  Medications   solifenacin (VESICARE) 10 MG tablet    Sig: Take 1 tablet (10 mg total) by mouth daily. At bedtime    Dispense:  30 tablet    Refill:  11    Labs or Scans Ordered during this encounter: No orders of the defined types were placed in this encounter.     Follow up Return in about 1 month (around 06/20/2024) for Follow up, with Dr Jayne.

## 2024-05-24 DIAGNOSIS — D485 Neoplasm of uncertain behavior of skin: Secondary | ICD-10-CM | POA: Diagnosis not present

## 2024-05-24 DIAGNOSIS — L432 Lichenoid drug reaction: Secondary | ICD-10-CM | POA: Diagnosis not present

## 2024-05-24 DIAGNOSIS — L570 Actinic keratosis: Secondary | ICD-10-CM | POA: Diagnosis not present

## 2024-05-24 DIAGNOSIS — D0472 Carcinoma in situ of skin of left lower limb, including hip: Secondary | ICD-10-CM | POA: Diagnosis not present

## 2024-05-24 DIAGNOSIS — I872 Venous insufficiency (chronic) (peripheral): Secondary | ICD-10-CM | POA: Diagnosis not present

## 2024-06-01 ENCOUNTER — Ambulatory Visit: Attending: Internal Medicine | Admitting: Internal Medicine

## 2024-06-01 ENCOUNTER — Ambulatory Visit (INDEPENDENT_AMBULATORY_CARE_PROVIDER_SITE_OTHER): Admitting: Otolaryngology

## 2024-06-01 VITALS — BP 118/72 | HR 75 | Ht 67.0 in | Wt 199.0 lb

## 2024-06-01 DIAGNOSIS — I493 Ventricular premature depolarization: Secondary | ICD-10-CM

## 2024-06-01 DIAGNOSIS — I471 Supraventricular tachycardia, unspecified: Secondary | ICD-10-CM | POA: Diagnosis not present

## 2024-06-01 DIAGNOSIS — I5032 Chronic diastolic (congestive) heart failure: Secondary | ICD-10-CM

## 2024-06-01 NOTE — Patient Instructions (Signed)
 Medication Instructions:  The current medical regimen is effective;  continue present plan and medications.  *If you need a refill on your cardiac medications before your next appointment, please call your pharmacy*  Follow-Up: At [facility], you and your health needs are our priority.  As part of our continuing mission to provide you with exceptional heart care, our providers are all part of one team.  This team includes your primary Cardiologist (physician) and Advanced Practice Providers or APPs (Physician Assistants and Nurse Practitioners) who all work together to provide you with the care you need, when you need it.  Your next appointment:   4 - 5 month(s)  Provider:   Stanly DELENA Leavens, MD    We recommend signing up for the patient portal called MyChart.  Sign up information is provided on this After Visit Summary.  MyChart is used to connect with patients for Virtual Visits (Telemedicine).  Patients are able to view lab/test results, encounter notes, upcoming appointments, etc.  Non-urgent messages can be sent to your provider as well.   To learn more about what you can do with MyChart, go to [email or web address].

## 2024-06-01 NOTE — Progress Notes (Signed)
 Cardiology Office Note:  .    Date:  06/01/2024  ID:  Natasha Wright, DOB [birth year], MRN [id number] PCP: Bertell Satterfield, MD  [facility] Providers Cardiologist:  Stanly DELENA Leavens, MD     CC: F/u SVT  History of Present Illness: .    Natasha Wright is a 63 y.o. female Natasha Wright is a 63 year old female with supraventricular tachycardia who presents with episodes of elevated heart rate and chest pain. She was referred by Dr. Ragena from [facility] for evaluation of her cardiac symptoms.  Natasha Wright is a 63 year old female with supraventricular tachycardia and heart failure with preserved ejection fraction who presents with symptoms related to her current medication regimen.  She has a history of supraventricular tachycardia and heart failure with preserved ejection fraction. Previous evaluations for coronary artery disease, including a stress test, showed no evidence of the disease.  She was started on flecainide  but feels 'pretty terrible' on this medication. She did not tolerate AV nodal agents well, as her heart rate dropped significantly with increased doses, leading to a feeling of unwellness.  Her cardiac symptoms have much improved.  She has done significant work on her diet to improve her cholesterol after the discussion of aortic atherosclerosis.  We reviewed this with her zero calcium score today.  She is feeling much better on her 1C agent and asks if she still needs to see EP.  She needs pelvic surgery and is hoping to get that sorted.  Relevant histories: .  Social - son had congenital arrhythmia issues and sees Dr. Reyna ([id number]) ROS: As per HPI.   Studies Reviewed: .   Cardiac Studies & Procedures   ______________________________________________________________________________________________   STRESS TESTS  MYOCARDIAL PERFUSION IMAGING 03/22/2024  Interpretation Summary   The study is normal. The study is low risk.   No ST  deviation was noted.   LV perfusion is normal.   Left ventricular function is normal. Nuclear stress EF: 63%. The left ventricular ejection fraction is normal (55-65%). End diastolic cavity size is normal. End systolic cavity size is normal.   Prior study available for comparison from 06/05/2020. There are changes compared to prior study. The left ventricular ejection fraction has increased.  Normal perfusion. LVEF 63% with normal wall motion. This is a low risk study. Compared to a prior study in 2021, the LVEF has increased.   ECHOCARDIOGRAM  ECHOCARDIOGRAM COMPLETE 02/29/2024  Narrative ECHOCARDIOGRAM REPORT    Patient Name:   Natasha Wright Date of Exam: 02/29/2024 Medical Rec #:  [id number]        Height:       68.0 in Accession #:    [id number]       Weight:       190.0 lb Date of Birth:  1961-10-21       BSA:          2.000 m Patient Age:    62 years         BP:           124/72 mmHg Patient Gender: F                HR:           76 bpm. Exam Location:  [address]  Procedure: 2D Echo, Cardiac Doppler and Color Doppler (Both Spectral and Color Flow Doppler were utilized during procedure).  Indications:    R00.2 Palpitations  History:  Patient has prior history of Echocardiogram examinations, most recent 03/29/2022. CHF and LVH, Arrythmias:PVC, Signs/Symptoms:Palpitations; Risk Factors:Hypertension and Former Smoker.  Sonographer:    Elsie Bohr RDCS Referring Phys: [id number] ROLLO JONELLE LOUDER  IMPRESSIONS   1. Left ventricular ejection fraction, by estimation, is 60 to 65%. The left ventricle has normal function. The left ventricle has no regional wall motion abnormalities. Left ventricular diastolic parameters were normal. 2. Right ventricular systolic function is normal. The right ventricular size is normal. 3. Left atrial size was mildly dilated. 4. The mitral valve is abnormal. Trivial mitral valve regurgitation. No evidence of mitral stenosis. 5. The  aortic valve was not well visualized. Aortic valve regurgitation is not visualized. No aortic stenosis is present. 6. The inferior vena cava is normal in size with greater than 50% respiratory variability, suggesting right atrial pressure of 3 mmHg.  FINDINGS Left Ventricle: Left ventricular ejection fraction, by estimation, is 60 to 65%. The left ventricle has normal function. The left ventricle has no regional wall motion abnormalities. Strain was performed and the global longitudinal strain is indeterminate. The left ventricular internal cavity size was normal in size. There is no left ventricular hypertrophy. Left ventricular diastolic parameters were normal.  Right Ventricle: The right ventricular size is normal. No increase in right ventricular wall thickness. Right ventricular systolic function is normal.  Left Atrium: Left atrial size was mildly dilated.  Right Atrium: Right atrial size was normal in size.  Pericardium: There is no evidence of pericardial effusion.  Mitral Valve: The mitral valve is abnormal. There is mild thickening of the mitral valve leaflet(s). There is mild calcification of the mitral valve leaflet(s). Mild mitral annular calcification. Trivial mitral valve regurgitation. No evidence of mitral valve stenosis.  Tricuspid Valve: The tricuspid valve is normal in structure. Tricuspid valve regurgitation is not demonstrated. No evidence of tricuspid stenosis.  Aortic Valve: The aortic valve was not well visualized. Aortic valve regurgitation is not visualized. No aortic stenosis is present.  Pulmonic Valve: The pulmonic valve was normal in structure. Pulmonic valve regurgitation is not visualized. No evidence of pulmonic stenosis.  Aorta: The aortic root is normal in size and structure.  Venous: The inferior vena cava is normal in size with greater than 50% respiratory variability, suggesting right atrial pressure of 3 mmHg.  IAS/Shunts: No atrial level shunt  detected by color flow Doppler.  Additional Comments: 3D was performed not requiring image post processing on an independent workstation and was indeterminate.   LEFT VENTRICLE PLAX 2D LVIDd:         4.00 cm   Diastology LVIDs:         2.50 cm   LV e' medial:    9.46 cm/s LV PW:         1.10 cm   LV E/e' medial:  9.1 LV IVS:        0.90 cm   LV e' lateral:   9.14 cm/s LVOT diam:     2.00 cm   LV E/e' lateral: 9.5 LV SV:         54 LV SV Index:   27 LVOT Area:     3.14 cm   RIGHT VENTRICLE             IVC RV S prime:     11.30 cm/s  IVC diam: 1.10 cm TAPSE (M-mode): 2.1 cm  LEFT ATRIUM             Index  RIGHT ATRIUM           Index LA diam:        3.90 cm 1.95 cm/m   RA Pressure: 3.00 mmHg LA Vol (A2C):   41.8 ml 20.90 ml/m  RA Area:     13.60 cm LA Vol (A4C):   38.7 ml 19.35 ml/m  RA Volume:   32.70 ml  16.35 ml/m LA Biplane Vol: 42.3 ml 21.15 ml/m AORTIC VALVE LVOT Vmax:   79.00 cm/s LVOT Vmean:  53.000 cm/s LVOT VTI:    0.173 m  AORTA Ao Root diam: 3.20 cm Ao Asc diam:  2.80 cm  MITRAL VALVE               TRICUSPID VALVE MV Area (PHT): 2.91 cm    Estimated RAP:  3.00 mmHg MV Decel Time: 261 msec MV E velocity: 86.50 cm/s  SHUNTS MV A velocity: 99.00 cm/s  Systemic VTI:  0.17 m MV E/A ratio:  0.87        Systemic Diam: 2.00 cm  Maude Emmer MD Electronically signed by Maude Emmer MD Signature Date/Time: 02/29/2024/8:23:10 AM    Final    MONITORS  LONG TERM MONITOR (3-14 DAYS) 03/05/2024  Narrative   Patient had a minimum heart rate of 56 bpm, maximum heart rate of 149 bpm, and average heart rate of 87 bpm. Predominant underlying rhythm was sinus rhythm. One run of SVT- 149 bpm, seven beats long. Isolated PACs were rare (<1.0%). Isolated PVCs were occasional (2.0 %). No evidence of significant heart block . Triggered and diary events associated with sinus rhythm, sinus tachycardia, and PVC. There is not a correlation between patient triggers  and relative increase in PVC burden.  No malignant arrhythmias.   CT SCANS  CT CORONARY MORPH W/CTA COR W/SCORE 09/30/2022  Addendum 09/30/2022  2:29 PM ADDENDUM REPORT: 09/30/2022 14:27  EXAM: OVER-READ INTERPRETATION  CT CHEST  The following report is an over-read performed by radiologist Dr. Lynwood Seip [facility] on 09/30/2022. This over-read does not include interpretation of cardiac or coronary anatomy or pathology. The coronary CTA interpretation by the cardiologist is attached.  COMPARISON:  None.  FINDINGS: The visualized portions of the extracardiac vascular structures are unremarkable. Visualized mediastinum is unremarkable. Visualized portion of upper abdomen is unremarkable. Visualized pulmonary parenchyma is unremarkable. Visualized skeleton is unremarkable.  IMPRESSION: No definite abnormality seen involving the visualized extracardiac structures of the chest.   Electronically Signed By: Lynwood Seip Raddle M.D. On: 09/30/2022 14:27  Narrative CLINICAL DATA:  63 yo female with chest pain  EXAM: Cardiac/Coronary CTA  TECHNIQUE: A non-contrast, gated CT scan was obtained with axial slices of 3 mm through the heart for calcium scoring. Calcium scoring was performed using the Agatston method. A 120 kV prospective, gated, contrast cardiac scan was obtained. Gantry rotation speed was 250 msecs and collimation was 0.6 mm. Two sublingual nitroglycerin  tablets (0.8 mg) were given. The 3D data set was reconstructed in 5% intervals of the 35-75% of the R-R cycle. Diastolic phases were analyzed on a dedicated workstation using MPR, MIP, and VRT modes. The patient received 95 cc of contrast.  FINDINGS: Image quality: Excellent.  Noise artifact is: Limited.  Coronary Arteries:  Normal coronary origin.  Right dominance.  Left main: The left main is a large caliber vessel with a normal take off from the left coronary cusp that bifurcates to  form a left anterior descending artery and a left circumflex artery. There is  no plaque or stenosis.  Left anterior descending artery: The LAD is patent without evidence of plaque or stenosis. The LAD gives off 1 patent diagonal branch.  Left circumflex artery: The LCX is non-dominant and patent with no evidence of plaque or stenosis. The LCX gives off 1 patent obtuse marginal branch.  Right coronary artery: The RCA is dominant with normal take off from the right coronary cusp. There is no evidence of plaque or stenosis. The RCA terminates as a PDA and right posterolateral branch without evidence of plaque or stenosis.  Right Atrium: Right atrial size is within normal limits.  Right Ventricle: The right ventricular cavity is within normal limits.  Left Atrium: Left atrial size is normal in size with no left atrial appendage filling defect.  Left Ventricle: The ventricular cavity size is within normal limits. There are no stigmata of prior infarction. There is no abnormal filling defect.  Pulmonary arteries: Normal in size without proximal filling defect.  Pulmonary veins: Normal pulmonary venous drainage.  Pericardium: Normal thickness with no significant effusion or calcium present.  Cardiac valves: The aortic valve is trileaflet without significant calcification. The mitral valve is normal structure without significant calcification.  Aorta: Normal caliber with no significant disease.  Extra-cardiac findings: See attached radiology report for non-cardiac structures.  IMPRESSION: 1. Coronary calcium score of 0. This was 0 percentile for age-, sex, and race-matched controls.  2. Normal coronary origin with right dominance.  3. No evidence of CAD.  RECOMMENDATIONS: 1 CAD-RADS 0: No evidence of CAD (0%). Consider non-atherosclerotic causes of chest pain.  Redell Shallow, MD  Electronically Signed: By: Redell Shallow M.D. On: 09/30/2022 14:12      ______________________________________________________________________________________________       Physical Exam:    VS:  BP 118/72 (BP Location: Right Arm)   Pulse 75   Ht 5' 7 (1.702 m)   Wt 199 lb (90.3 kg)   SpO2 95%   BMI 31.17 kg/m    Wt Readings from Last 3 Encounters:  06/01/24 199 lb (90.3 kg)  05/21/24 200 lb (90.7 kg)  03/22/24 203 lb (92.1 kg)    Gen: No distress  Neck: No JVD Cardiac: No Rubs or Gallops, no murmur, RRR +2 radial pulses Respiratory: Clear to auscultation bilaterally, normal effort, normal  respiratory rate GI: Soft, nontender, non-distended  MS: No pitting edema;  moves all extremities Integument: Skin feels warm, dog scratch is healing R leg Neuro:  At time of evaluation, alert and oriented to person/place/time/situation  Psych: Tense affect, patient feels upset   ASSESSMENT AND PLAN: .    Supraventricular tachycardia (SVT) - continue BB and 1C agent; ok to cancel EP eval  Chest pain - resolved; non cardiac  Mitral valve prolapse HFpEF; NYHA I euvolemic - Mitral valve prolapse with no significant regurgitation. - euvolemic on current regimen  Restrictive lung disease Scar tissue in lungs, likely secondary to COVID-19, contributing to shortness of breath. Managed by pulmonology  Esophageal dysphagia - she should proceed with dilation if still planned for by EP  Pelvic floor prolapse - negative stress test with zero calcium score and normal cardiac function; SVT burden improved with 1c agent; reasonable to proceed with surgery; results to be sent to Dr. Oda Stanly Leavens, MD FASE [facility] Cardiologist [facility]  [facility]  [address] [place], [address] [place], [place] [address] [phone]  5:36 PM

## 2024-06-19 ENCOUNTER — Ambulatory Visit: Admitting: Obstetrics & Gynecology

## 2024-06-19 ENCOUNTER — Encounter: Payer: Self-pay | Admitting: Obstetrics & Gynecology

## 2024-06-19 VITALS — BP 138/79 | HR 94 | Ht 67.0 in | Wt 202.0 lb

## 2024-06-19 DIAGNOSIS — N3946 Mixed incontinence: Secondary | ICD-10-CM

## 2024-06-19 DIAGNOSIS — N816 Rectocele: Secondary | ICD-10-CM | POA: Diagnosis not present

## 2024-06-19 DIAGNOSIS — N8110 Cystocele, unspecified: Secondary | ICD-10-CM

## 2024-06-19 NOTE — Progress Notes (Signed)
 Follow up appointment Response to pessary and vesicare   Chief Complaint  Patient presents with   Pessary Check    Blood pressure 138/79, pulse 94, height 5' 7 (1.702 m), weight 202 lb (91.6 kg).  Her incontinence is 100% better so that would suggest isolated urge not stress as well She had to take her pessary out because of pain in the rectum from the pessary She wants to consider surgical management  Will refer to Urogynecology colleagues for evaluation   MEDS ordered this encounter: No orders of the defined types were placed in this encounter.   Orders for this encounter: Orders Placed This Encounter  Procedures   Ambulatory referral to Urogynecology    Impression + Management Plan   ICD-10-CM   1. Rectocele  N81.6 Ambulatory referral to Urogynecology    2. Baden-Walker grade 2 cystocele  N81.10 Ambulatory referral to Urogynecology    3. Mixed incontinence urge and stress  N39.46 Ambulatory referral to Urogynecology      Follow Up: Return if symptoms worsen or fail to improve.     All questions were answered.  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   Family history of heart disease    GERD (gastroesophageal reflux disease)    Hypoglycemia    IBS (irritable bowel syndrome)    Lumbar disc disease    OA (osteoarthritis)    PONV (postoperative nausea and vomiting)    needs scop patch   Rectocele    Shingles    Spinal stenosis in cervical region     Past Surgical History:  Procedure Laterality Date   ABDOMINAL HYSTERECTOMY  1992   ARTHRODESIS FOOT WITH WEIL OSTEOTOMY Left 05/09/2014   Procedure: LEFT FIRST TARSAL METATARSAL  ARTHRODESIS; LEFT SECOND WEIL OSTEOTOMY;  Surgeon: Norleen Armor, MD;  Location: [facility];  Service: Orthopedics;  Laterality: Left;   BIOPSY  07/11/2018   Procedure: BIOPSY;  Surgeon: Harvey Margo CROME, MD;  [facility];   Service: Endoscopy;;  duodenal biopsy , gastric biopsy    BIOPSY  07/04/2023   Procedure: BIOPSY;  Surgeon: Cindie Carlin POUR, DO;  [facility];  Service: Endoscopy;;   BUNIONECTOMY WITH HAMMERTOE RECONSTRUCTION Left 05/09/2014   Procedure: LEFT MODIFIED MCBRIDE BUNIONECTOMY; LEFT SECOND HAMMER TOE CORRECTION;  Surgeon: Norleen Armor, MD;  Location: [facility];  Service: Orthopedics;  Laterality: Left;   CERVICAL SPINE SURGERY  2009   CHOLECYSTECTOMY  2004   lap choli   COLONOSCOPY  07/08/2003   RMR: Left-sided diverticula, remainder of colonic mucosa and terminal ileum appeared normal.. Minimally friable internal hemorrhoids, otherwise normal rectum   COLONOSCOPY N/A 04/30/2013   Dr. Harvey: mild diverticulosis in descending and sigmoid colon, internal hemorrhoids.    COLONOSCOPY WITH PROPOFOL  N/A 07/04/2023   Procedure: COLONOSCOPY WITH PROPOFOL ;  Surgeon: Cindie Carlin POUR, DO;  [facility];  Service: Endoscopy;  Laterality: N/A;  10:45 AM, ASA 3, pt knows to arrive at 8:00   DILATION AND CURETTAGE OF UTERUS     ESOPHAGOGASTRODUODENOSCOPY (EGD) WITH PROPOFOL  N/A 07/11/2018   Procedure: ESOPHAGOGASTRODUODENOSCOPY (EGD) WITH PROPOFOL ;  Surgeon: Harvey Margo CROME, MD;  [facility];  Service: Endoscopy;  Laterality: N/A;  9:30am   KNEE ARTHROSCOPY Right    NASAL SINUS SURGERY     NM MYOCAR PERF WALL MOTION  10/2011   bruce myoview  - breast attenuation noted in anterior region; EF 65%; no ischemia/infarct/scar; low risk   POLYPECTOMY  07/04/2023   Procedure: POLYPECTOMY;  Surgeon: Cindie Carlin POUR, DO;  [facility];  Service: Endoscopy;;   right ankle surgery     SAVORY DILATION N/A 07/11/2018   Procedure: SAVORY DILATION;  Surgeon: Harvey Margo CROME, MD;  [facility];  Service: Endoscopy;  Laterality: N/A;   SHOULDER SURGERY     TONSILLECTOMY  1980   TUBAL LIGATION      OB History     Gravida  1   Para      Term       Preterm      AB      Living         SAB      IAB      Ectopic      Multiple      Live Births              Allergies  Allergen Reactions   Penicillins Shortness Of Breath and Rash    Has patient had a PCN reaction causing immediate rash, facial/tongue/throat swelling, SOB or lightheadedness with hypotension: Yes Has patient had a PCN reaction causing severe rash involving mucus membranes or skin necrosis: No Has patient had a PCN reaction that required hospitalization Yes Has patient had a PCN reaction occurring within the last 10 years: No If all of the above answers are NO, then may proceed with Cephalosporin use.    Sulfa Antibiotics Hives, Rash, Shortness Of Breath and Swelling   Sulfamethoxazole-Trimethoprim Shortness Of Breath and Rash   Aspirin      Other Reaction(s): Other  Stomach issues   Codeine Nausea Only and Other (See Comments)    skin crawling   Fentanyl  Nausea Only and Other (See Comments)    skin crawling, passed out   Ibuprofen     Other Reaction(s): Other  GI upset   Morphine Nausea Only and Other (See Comments)    skin crawling   Adhesive [Tape] Other (See Comments)    Rips off skin, paper tape is ok   Influenza Vaccines Rash   Latex Rash   Nsaids Nausea Only   Other Nausea Only    Social History   Socioeconomic History   Marital status: Married    Spouse name: Jayson   Number of children: 1   Years of education: 14   Highest education level: Not on file  Occupational History   Occupation: Unemployed    Employer: [employer]    Employer: UNEMPLOYED  Tobacco Use   Smoking status: Former    Current packs/day: 0.00    Average packs/day: 0.5 packs/day for 40.0 years (20.0 ttl pk-yrs)    Types: Cigarettes    Start date: 12/24/1973    Quit date: 12/24/2013    Years since quitting: 10.4   Smokeless tobacco: Never   Tobacco comments:    using vape as aid to sustain from smoking  Vaping Use   Vaping status: Never  Used  Substance and Sexual Activity   Alcohol use: No   Drug use: No   Sexual activity: Not Currently    Birth control/protection: None, Surgical    Comment: hysy  Other Topics Concern   Not on file  Social History Narrative   One son, Down syndrome,is right handed ,resides with son and husband   Right handed   Social Drivers of Health  Financial Resource Strain: Low Risk  (06/19/2024)   Overall Financial Resource Strain (CARDIA)    Difficulty of Paying Living Expenses: Not very hard  Food Insecurity: No Food Insecurity (06/19/2024)   Hunger Vital Sign    Worried About Running Out of Food in the Last Year: Never true    Ran Out of Food in the Last Year: Never true  Transportation Needs: No Transportation Needs (06/19/2024)   PRAPARE - Administrator, Civil Service (Medical): No    Lack of Transportation (Non-Medical): No  Recent Concern: Transportation Needs - Unmet Transportation Needs (06/19/2024)   PRAPARE - Transportation    Lack of Transportation (Medical): Yes    Lack of Transportation (Non-Medical): Yes  Physical Activity: Inactive (06/19/2024)   Exercise Vital Sign    Days of Exercise per Week: 0 days    Minutes of Exercise per Session: 10 min  Stress: No Stress Concern Present (06/19/2024)   [employer] of Occupational Health - Occupational Stress Questionnaire    Feeling of Stress: Not at all  Recent Concern: Stress - Stress Concern Present (06/19/2024)   [employer] of Occupational Health - Occupational Stress Questionnaire    Feeling of Stress: Very much  Social Connections: Somewhat Isolated (11/24/2023)   Received from [facility]   Social Network    How would you rate your social network (family, work, friends)?: Restricted participation with some degree of social isolation    Family History  Problem Relation Age of Onset   Heart disease Paternal Grandfather    Stroke Paternal Grandmother    Diabetes Paternal Grandmother    Heart  disease Paternal Grandmother    Breast cancer Maternal Grandmother    Hypertension Maternal Grandmother    Diabetes Maternal Grandmother    Stroke Maternal Grandmother    Asthma Maternal Grandmother    Stroke Maternal Grandfather    Ulcerative colitis Father    Diabetes Mother    Hypertension Mother    Hyperlipidemia Mother    Alzheimer's disease Mother    Hyperlipidemia Brother    Hyperlipidemia Sister    Hypertension Sister    Diabetes Sister    Down syndrome Child    Heart disease Child    Colon cancer Neg Hx

## 2024-06-20 ENCOUNTER — Other Ambulatory Visit: Payer: Self-pay

## 2024-06-20 MED ORDER — FLECAINIDE ACETATE 50 MG PO TABS: 50.0000 mg | ORAL_TABLET | Freq: Two times a day (BID) | ORAL | 3 refills | Status: AC

## 2024-06-26 DIAGNOSIS — L570 Actinic keratosis: Secondary | ICD-10-CM | POA: Diagnosis not present

## 2024-06-26 DIAGNOSIS — D0472 Carcinoma in situ of skin of left lower limb, including hip: Secondary | ICD-10-CM | POA: Diagnosis not present

## 2024-06-26 DIAGNOSIS — I872 Venous insufficiency (chronic) (peripheral): Secondary | ICD-10-CM | POA: Diagnosis not present

## 2024-07-09 DIAGNOSIS — D0472 Carcinoma in situ of skin of left lower limb, including hip: Secondary | ICD-10-CM | POA: Diagnosis not present

## 2024-07-09 NOTE — Progress Notes (Signed)
 GI Office Note    Referring Provider: Bertell Satterfield, MD Primary Care Physician:  Bertell Satterfield, MD  Primary Gastroenterologist: Carlin POUR. Cindie, DO   Chief Complaint   Chief Complaint  Patient presents with   Follow-up    Having issues with indigestion and diarrhea, thinks the cholestyramine  is causing the indigestion. States that the diarrhea doesn't happen all the time.    History of Present Illness   Natasha Wright is a 63 y.o. female presenting today for follow-up.  Last seen in February.  She has a history of abdominal pain, diarrhea, bloating, gas, IBS, diverticulitis.  January 09, 2024 OV: She was having difficulty swallowing Colestid  pills.  We switched her to the powder but insurance did not want to pay for it.  Started her on Questran  4 g daily.  She was also having problems with dysphagia seen by speech, had been sent by her neurologist.  During bedside swallow evaluation and they were advising for modified barium swallow study and to evaluate for esophageal stricture.  She had to cancel her EGD due to tachycardia.  Seen by cardiology, noted to have SVT.  She had a low risk myocardial perfusion imaging test in May, cleared for any endoscopy needed.   Discussed the use of AI scribe software for clinical note transcription with the patient, who gave verbal consent to proceed.   Since she was here last, she states her rectocele has worsened. She saw Dr. Jayne and was moving towards surgery but recently he felt her surgery would best be done by urogyn and she is waiting for the appointment. She experiences worsening diarrhea and abdominal pain, attributing it to her colon being 'permanently stretched out.' Diarrhea occurs every other day, often with urgency and a foul-smelling, tan, gooey consistency, sometimes preceded by indigestion and nausea.   She uses cholestyramine  4 grams in the morning, which initially helped regulate her bowel movements to once daily without  urgency or constipation. However, she now experiences diarrhea two to three times a week. She is having a BM about every other day. She has noted increased indigestion since starting cholestyramine . She takes Protonix  twice daily, before breakfast and supper, but still experiences indigestion and reflux, particularly after meals. She rarely takes dicyclomine. She continues to have swallowing concerns (meat/pills)and wants to have EGD/ED in near future once things settle down. She had to cancel previously due to tachycardia but that is now under control. No melena, brbpr. No vomiting.  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      Prior Data   Colonoscopy 06/2023: -diverticulosis -one 8mm polyp in ascending colon removed, tubular adenomas -one 5mm polyp in rectum removed, hyperplastic -random colon biopsies negative -next colonoscopy five years   EGD 06/2018: -esophagus normal s/p dilation, patient responded to dilation -mild gastritis, no h.pylori -duodenal bx negative.   Colonoscopy 04/2013: -mild diverticulosis -small internal hemorrhoids  Medications   Current Outpatient Medications  Medication Sig Dispense Refill   acetaminophen (TYLENOL) 650 MG CR tablet Take 1,300 mg by mouth 2 (two) times daily as needed for pain.     atenolol  (TENORMIN ) 25 MG tablet Take 1 tablet (25 mg total) by mouth daily. 90 tablet 2   azelastine (ASTELIN) 0.1 % nasal spray as needed for allergies.     budesonide (RHINOCORT AQUA) 32 MCG/ACT nasal spray as needed for allergies.     cholestyramine  (QUESTRAN ) 4 g packet Take  1 packet (4 g total) by mouth daily. Separate from other medications by 2 hours. 90 each 3   citalopram  (CELEXA ) 20 MG tablet Take 1 tablet (20 mg total) by mouth 2 (two) times daily. 180 tablet 3   dicyclomine (BENTYL) 20 MG tablet Take 20 mg by mouth 4 (four) times daily as needed for spasms. Rarely takes.      estradiol (ESTRACE) 0.5 MG tablet Take 0.5 mg by mouth at bedtime.      flecainide  (TAMBOCOR ) 50 MG tablet Take 1 tablet (50 mg total) by mouth 2 (two) times daily. 180 tablet 3   fluticasone (FLONASE) 50 MCG/ACT nasal spray Place 1 spray into both nostrils 2 (two) times daily.     furosemide  (LASIX ) 20 MG tablet Take 20 mg by mouth daily.     ipratropium (ATROVENT) 0.06 % nasal spray Place 2 sprays into both nostrils daily.     mirtazapine (REMERON) 15 MG tablet Take 15 mg by mouth at bedtime.     mometasone  (NASONEX ) 50 MCG/ACT nasal spray Place 2 sprays into the nose daily. 1 each 12   Multiple Vitamin (MULTIVITAMIN WITH MINERALS) TABS tablet Take 1 tablet by mouth daily.     mupirocin ointment (BACTROBAN) 2 % Apply topically.     pantoprazole  (PROTONIX ) 40 MG tablet TAKE 1 TABLET BY MOUTH 30 MINUTES PRIOR TO MEALS TWICE DAILY 60 tablet 11   rosuvastatin (CRESTOR) 10 MG tablet Take 10 mg by mouth at bedtime.     solifenacin  (VESICARE ) 10 MG tablet Take 1 tablet (10 mg total) by mouth daily. At bedtime 30 tablet 11   spironolactone  (ALDACTONE ) 25 MG tablet TAKE 1 TABLET BY MOUTH DAILY 100 tablet 3   tacrolimus (PROTOPIC) 0.1 % ointment Apply topically 2 (two) times daily.     triamcinolone  ointment (KENALOG ) 0.1 % Apply 1 Application topically 2 (two) times daily.     No current facility-administered medications for this visit.    Allergies   Allergies as of 07/10/2024 - Review Complete 07/10/2024  Allergen Reaction Noted   Penicillins Shortness Of Breath and Rash    Sulfa antibiotics Hives, Rash, Shortness Of Breath, and Swelling 08/10/2017   Sulfamethoxazole-trimethoprim Shortness Of Breath and Rash    Aspirin   12/12/2017   Codeine Nausea Only and Other (See Comments)    Fentanyl  Nausea Only and Other (See Comments)    Ibuprofen  08/10/2017   Morphine Nausea Only and Other (See Comments)    Adhesive [tape] Other (See Comments) 06/29/2018   Influenza vaccines Rash 03/06/2014    Latex Rash 06/29/2018   Nsaids Nausea Only 04/23/2013   Other Nausea Only 12/15/2017         Review of Systems   General: Negative for anorexia, weight loss, fever, chills, fatigue, weakness. ENT: Negative for hoarseness, difficulty swallowing , nasal congestion.see hpi CV: Negative for chest pain, angina, palpitations, dyspnea on exertion, peripheral edema.  Respiratory: Negative for dyspnea at rest, dyspnea on exertion, cough, sputum, wheezing.  GI: See history of present illness. GU:  Negative for dysuria, hematuria, urinary incontinence, urinary frequency, nocturnal urination. See hpi Endo: Negative for unusual weight change.     Physical Exam   BP 127/81 (BP Location: Right Arm, Patient Position: Sitting, Cuff Size: Large)   Pulse 73   Temp (!) 96.7 F (35.9 C) (Oral)   Ht 5' 7 (1.702 m)   Wt 201 lb 6.4 oz (91.4 kg)   SpO2 95%   BMI 31.54 kg/m  General: Well-nourished, well-developed in no acute distress.  Eyes: No icterus. Mouth: Oropharyngeal mucosa moist and pink   Lungs: Clear to auscultation bilaterally.  Heart: Regular rate and rhythm, no murmurs rubs or gallops.  Abdomen: Bowel sounds are normal, nontender, nondistended no rebound or guarding.  Rectal: not performed Extremities: No lower extremity edema. No clubbing or deformities. Chronic dermatitis.  Neuro: Alert and oriented x 4   Skin: Warm and dry, no jaundice.   Psych: Alert and cooperative, normal mood and affect.  Labs   Lab Results  Component Value Date   NA 139 02/07/2024   CL 103 02/07/2024   K 4.8 02/07/2024   CO2 21 02/07/2024   BUN 11 02/07/2024   CREATININE 0.97 02/07/2024   EGFR 65 02/07/2024   CALCIUM 9.5 02/07/2024   ALBUMIN 4.9 02/10/2018   GLUCOSE 78 02/07/2024   Lab Results  Component Value Date   ALT 18 02/10/2018   AST 18 02/10/2018   ALKPHOS 80 02/10/2018   BILITOT 0.4 02/10/2018   Lab Results  Component Value Date   WBC 6.9 02/07/2024   HGB 15.0 02/07/2024    HCT 43.6 02/07/2024   MCV 92 02/07/2024   PLT 298 02/07/2024   Lab Results  Component Value Date   TSH 1.500 02/07/2024    Imaging Studies   No results found.  Assessment/Plan:       Rectocele with bowel dysfunction Rectocele with associated bowel dysfunction causing intermittent diarrhea and severe pain. She has been referred to a urogynecology center for further evaluation and management, as the current situation is beyond the scope of her previous provider. - Continue with referral to the urogynecology center in [place] for further evaluation and management of rectocele.  Gastroesophageal reflux disease (GERD)/dysphagia: GERD with symptoms of indigestion and nausea, exacerbated by current medication regimen. She is currently taking Protonix  twice daily and has been using Pepcid  for breakthrough symptoms. The reflux symptoms may be related to the timing and dosage of her current bowel medication. A change from Questran  powder to colestipol  pills is planned to potentially reduce reflux symptoms. - Switch from Questran  powder to colestipol  pills, taking one pill twice a day, to potentially reduce reflux symptoms. - Continue Protonix  twice daily. - Use Pepcid  as needed for breakthrough reflux symptoms. -call when she is ready to schedule EGD/ED  Chronic diarrhea   Chronic diarrhea with recent exacerbation of symptoms, including tan, foul-smelling diarrhea. She previously had good control with colestipol  pills but due to dysphagia issues she was switched to questran  powder. She believes questran  is exacerbating her reflux symptoms and wants to go back to colestipol .  - Switch from Questran  powder back to colestipol  pills, taking one pill twice a day.  Recording duration: 16 minutes      Egd/ed when ready Look at [employer]  May have to change ppi, use h2 bid prn Colestid  1g bid    Sonny RAMAN. Ezzard, MHS, PA-C [facility]

## 2024-07-10 ENCOUNTER — Ambulatory Visit: Admitting: Gastroenterology

## 2024-07-10 ENCOUNTER — Encounter: Payer: Self-pay | Admitting: Gastroenterology

## 2024-07-10 VITALS — BP 127/81 | HR 73 | Temp 96.7°F | Ht 67.0 in | Wt 201.4 lb

## 2024-07-10 DIAGNOSIS — R1319 Other dysphagia: Secondary | ICD-10-CM

## 2024-07-10 DIAGNOSIS — K529 Noninfective gastroenteritis and colitis, unspecified: Secondary | ICD-10-CM

## 2024-07-10 DIAGNOSIS — K219 Gastro-esophageal reflux disease without esophagitis: Secondary | ICD-10-CM

## 2024-07-10 DIAGNOSIS — R197 Diarrhea, unspecified: Secondary | ICD-10-CM

## 2024-07-10 MED ORDER — COLESTIPOL HCL 1 G PO TABS: 1.0000 g | ORAL_TABLET | Freq: Two times a day (BID) | ORAL | 3 refills | Status: DC

## 2024-07-10 NOTE — Patient Instructions (Addendum)
 I suspect your bowel issues are no longer controlled because of your worsening rectocele. Since you are having issues with reflux potentially related to the cholestyramine , let's go back to colestid  1 gram twice daily. Stop cholestyramine . I have sent in new medication for colestid  make sure to separate from other medications by 2 hours. Continue pantoprazole  40mg  twice daily before breakfast and evening meal. Continue pepcid  20mg  one to two times daily as needed. If reflux symptoms do not settle down, we can change your pantoprazole .  Let me know when you are ready for upper endoscopy.

## 2024-07-30 DIAGNOSIS — D0471 Carcinoma in situ of skin of right lower limb, including hip: Secondary | ICD-10-CM | POA: Diagnosis not present

## 2024-07-30 DIAGNOSIS — L570 Actinic keratosis: Secondary | ICD-10-CM | POA: Diagnosis not present

## 2024-07-30 DIAGNOSIS — Z85828 Personal history of other malignant neoplasm of skin: Secondary | ICD-10-CM | POA: Diagnosis not present

## 2024-07-30 DIAGNOSIS — Z08 Encounter for follow-up examination after completed treatment for malignant neoplasm: Secondary | ICD-10-CM | POA: Diagnosis not present

## 2024-07-30 DIAGNOSIS — D485 Neoplasm of uncertain behavior of skin: Secondary | ICD-10-CM | POA: Diagnosis not present

## 2024-07-30 DIAGNOSIS — D0462 Carcinoma in situ of skin of left upper limb, including shoulder: Secondary | ICD-10-CM | POA: Diagnosis not present

## 2024-07-30 DIAGNOSIS — I872 Venous insufficiency (chronic) (peripheral): Secondary | ICD-10-CM | POA: Diagnosis not present

## 2024-08-13 ENCOUNTER — Encounter: Payer: Self-pay | Admitting: Obstetrics

## 2024-08-13 ENCOUNTER — Ambulatory Visit: Admitting: Obstetrics

## 2024-08-13 VITALS — BP 108/68 | HR 84 | Ht 66.0 in | Wt 203.6 lb

## 2024-08-13 DIAGNOSIS — N816 Rectocele: Secondary | ICD-10-CM | POA: Diagnosis not present

## 2024-08-13 DIAGNOSIS — R109 Unspecified abdominal pain: Secondary | ICD-10-CM

## 2024-08-13 DIAGNOSIS — N941 Unspecified dyspareunia: Secondary | ICD-10-CM

## 2024-08-13 DIAGNOSIS — R8290 Unspecified abnormal findings in urine: Secondary | ICD-10-CM | POA: Insufficient documentation

## 2024-08-13 DIAGNOSIS — K5900 Constipation, unspecified: Secondary | ICD-10-CM | POA: Diagnosis not present

## 2024-08-13 DIAGNOSIS — Z87440 Personal history of urinary (tract) infections: Secondary | ICD-10-CM | POA: Insufficient documentation

## 2024-08-13 DIAGNOSIS — N3946 Mixed incontinence: Secondary | ICD-10-CM | POA: Diagnosis not present

## 2024-08-13 LAB — POCT URINALYSIS DIP (CLINITEK)
Bilirubin, UA: NEGATIVE
Bilirubin, UA: NEGATIVE
Blood, UA: NEGATIVE
Glucose, UA: NEGATIVE mg/dL
Glucose, UA: NEGATIVE mg/dL
Ketones, POC UA: NEGATIVE mg/dL
Ketones, POC UA: NEGATIVE mg/dL
Leukocytes, UA: NEGATIVE
Leukocytes, UA: NEGATIVE
Nitrite, UA: NEGATIVE
Nitrite, UA: NEGATIVE
POC PROTEIN,UA: NEGATIVE
POC PROTEIN,UA: NEGATIVE
Spec Grav, UA: 1.015 (ref 1.010–1.025)
Spec Grav, UA: 1.02 (ref 1.010–1.025)
Urobilinogen, UA: 0.2 U/dL
Urobilinogen, UA: 0.2 U/dL
pH, UA: 6 (ref 5.0–8.0)
pH, UA: 6 (ref 5.0–8.0)

## 2024-08-13 MED ORDER — ESTRADIOL 0.1 MG/GM VA CREA
TOPICAL_CREAM | VAGINAL | 3 refills | Status: AC
Start: 2024-08-16 — End: ?
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
## 2024-08-13 NOTE — Assessment & Plan Note (Addendum)
-   POCT UA + heme, catheterized UA negative, PVR 24mL - denies UTI symptoms
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
## 2024-08-13 NOTE — Assessment & Plan Note (Addendum)
-   denies UTI symptoms today, reports chronic UTI symptoms resolved spontaneously in the past - POCT negative - For treatment of recurrent urinary tract infections, we discussed management of recurrent UTIs including prophylaxis with a daily low dose antibiotic, transvaginal estrogen therapy, D-mannose, and cranberry supplements.  We discussed the role of diagnostic testing such as cystoscopy and upper tract imaging.   - Rx to start vaginal estrogen - return to the office for testing when she experiences UTI symptoms  - encouraged NSAIDs, Tylenol, and Cystex use PRN symptoms with treatment of GERD prior to dosing - For irritative bladder we reviewed treatment options including altering her diet to avoid irritative beverages and foods as well as attempting to decrease stress and other exacerbating factors.  We also discussed using pyridium and similar over-the-counter medications for pain relief as needed. We discussed the pentad of medications including Tums, an antihistamine such as Vistaril, amitriptyline, and L-arginine.  We also discussed in-office bladder instillations for pain flares, as well as cystoscopy with hydrodistention in the operating room, which can be both diagnostic and therapeutic. - encouraged bladder diary to identify triggers - encouraged stress management, therapy  - referral to pelvic floor PT  - encouraged to consider bladder instillation PRN flare of symptoms

## 2024-08-13 NOTE — Assessment & Plan Note (Addendum)
-   IBS symptoms since 63yo - s/p cholecystectomy on colestipol  - prior use of Linzess - For constipation, we reviewed the importance of a better bowel regimen.  We also discussed the importance of avoiding chronic straining, as it can exacerbate her pelvic floor symptoms; we discussed treating constipation and straining prior to surgery, as postoperative straining can lead to damage to the repair and recurrence of symptoms. We discussed initiating therapy with increasing fluid intake, fiber supplementation, stool softeners, and laxatives such as miralax.  - encouraged titration of fiber supplementation or miralax use to reduce straining - advised squatting position for bowel movements and splinting to reduce straining - encouraged to follow-up with GI - discontinue Ditropan 5mg  BID due to risk of constipation exacerbation - referral to pelvic floor PT due to high tone pelvic floor and pelvic floor myofascial pain
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
## 2024-08-13 NOTE — Assessment & Plan Note (Addendum)
-   straining with chronic IBS - trial of #4 ring with support use with exacerbation of pain, despite resolution of urgency urinary incontinence per chart review - For treatment of pelvic organ prolapse, we discussed options for management including expectant management, conservative management, and surgical management, such as Kegels, a pessary, pelvic floor physical therapy, and specific surgical procedures. - referral to pelvic floor PT due to pelvic floor myofascial pain - encouraged to optimize stool consistency

## 2024-08-13 NOTE — Assessment & Plan Note (Addendum)
-   POCT UA negative, PVR 24mL - ditropan caused indigestion  - SUI > OAB since Vesicare  use with reduction of night time frequency - prior pessary use with resolution of urgency urinary leakage per documentation - pt reports use of pessary postpartum for urinary and fecal incontinence for 6 months - For treatment of stress urinary incontinence,  non-surgical options include expectant management, weight loss, physical therapy, as well as a pessary.  Surgical options include a midurethral sling, Burch urethropexy, and transurethral injection of a bulking agent. - referral to pelvic floor PT - Rx to start low dose vaginal estrogen - We discussed the symptoms of overactive bladder (OAB), which include urinary urgency, urinary frequency, nocturia, with or without urge incontinence.  While we do not know the exact etiology of OAB, several treatment options exist. We discussed management including behavioral therapy (decreasing bladder irritants, urge suppression strategies, timed voids, bladder retraining), physical therapy, medication; for refractory cases posterior tibial nerve stimulation, sacral neuromodulation, and intravesical botulinum toxin injection.  For anticholinergic medications, we discussed the potential side effects of anticholinergics including dry eyes, dry mouth, constipation, cognitive impairment and urinary retention. For Beta-3 agonist medication, we discussed the potential side effect of elevated blood pressure which is more likely to occur in individuals with uncontrolled hypertension. - mirabegron and Gemtesa not covered by insurance, discontinue Vesicare  and start trial of Trospium . Encouraged to contact office if cost prohibitive, can send Rx to [facility] - discussed fluid management and caffeine reduction - encouraged weight reduction

## 2024-08-13 NOTE — Patient Instructions (Signed)
 Please refer to handouts from [facility] at this website.  [email or web address]  You have a stage 2 (out of 4) prolapse.  We discussed the fact that it is not life threatening but there are several treatment options. For treatment of pelvic organ prolapse, we discussed options for management including expectant management, conservative management, and surgical management, such as Kegels, a pessary, pelvic floor physical therapy, and specific surgical procedures.     Constipation: Our goal is to achieve formed bowel movements daily or every-other-day.  You may need to try different combinations of the following options to find what works best for you - everybody's body works differently so feel free to adjust the dosages as needed.  Some options to help maintain bowel health include:  Dietary changes (more leafy greens, vegetables and fruits; less processed foods) Fiber supplementation (Benefiber, FiberCon, Metamucil or Psyllium). Start slow and increase gradually to full dose. Over-the-counter agents such as: stool softeners (Docusate or Colace) and/or laxatives (Miralax, milk of magnesia)  Power Pudding is a natural mixture that may help your constipation.  To make blend 1 cup applesauce, 1 cup wheat bran, and 3/4 cup prune juice, refrigerate and then take 1 tablespoon daily with a large glass of water  as needed.   Women should try to eat at least 21 to 25 grams of fiber a day, while men should aim for 30 to 38 grams a day. You can add fiber to your diet with food or a fiber supplement such as psyllium (metamucil), benefiber, or fibercon.   Here's a look at how much dietary fiber is found in some common foods. When buying packaged foods, check the Nutrition Facts label for fiber content. It can vary among brands.  Fruits Serving size Total fiber (grams)*  Raspberries 1 cup 8.0  Pear 1 medium 5.5  Apple, with skin 1 medium 4.5  Banana 1 medium 3.0  Orange 1 medium 3.0   Strawberries 1 cup 3.0   Vegetables Serving size Total fiber (grams)*  Green peas, boiled 1 cup 9.0  Broccoli, boiled 1 cup chopped 5.0  Turnip greens, boiled 1 cup 5.0  Brussels sprouts, boiled 1 cup 4.0  Potato, with skin, baked 1 medium 4.0  Sweet corn, boiled 1 cup 3.5  Cauliflower, raw 1 cup chopped 2.0  Carrot, raw 1 medium 1.5   Grains Serving size Total fiber (grams)*  Spaghetti, whole-wheat, cooked 1 cup 6.0  Barley, pearled, cooked 1 cup 6.0  Bran flakes 3/4 cup 5.5  Quinoa, cooked 1 cup 5.0  Oat bran muffin 1 medium 5.0  Oatmeal, instant, cooked 1 cup 5.0  Popcorn, air-popped 3 cups 3.5  Brown rice, cooked 1 cup 3.5  Bread, whole-wheat 1 slice 2.0  Bread, rye 1 slice 2.0   Legumes, nuts and seeds Serving size Total fiber (grams)*  Split peas, boiled 1 cup 16.0  Lentils, boiled 1 cup 15.5  Black beans, boiled 1 cup 15.0  Baked beans, canned 1 cup 10.0  Chia seeds 1 ounce 10.0  Almonds 1 ounce (23 nuts) 3.5  Pistachios 1 ounce (49 nuts) 3.0  Sunflower kernels 1 ounce 3.0  *Rounded to nearest 0.5 gram. Source: [employer] for [employer], Legacy Release    Use a squatting position for bowel movements  We discussed the symptoms of overactive bladder (OAB), which include urinary urgency, urinary frequency, night-time urination, with or without urge incontinence.  We discussed management including behavioral therapy (decreasing bladder irritants by following a bladder diet, urge suppression  strategies, timed voids, bladder retraining), physical therapy, medication; and for refractory cases posterior tibial nerve stimulation, sacral neuromodulation, and intravesical botulinum toxin injection.   For anticholinergic medications, we discussed the potential side effects of anticholinergics including dry eyes, dry mouth, constipation, rare risks of cognitive impairment and urinary retention. You were given prescription for Trospium .  It can take a  month to start working so give it time, but if you have bothersome side effects call sooner and we can try a different medication.  Call us  if you have trouble filling the prescription or if it's not covered by your insurance.  Stop Vesicare .  For treatment of stress urinary incontinence, which is leakage with physical activity/movement/strainging/coughing, we discussed expectant management versus nonsurgical options versus surgery. Nonsurgical options include weight loss, physical therapy, as well as a pessary.  Surgical options include a midurethral sling, which is a synthetic mesh sling that acts like a hammock under the urethra to prevent leakage of urine, a Burch urethropexy, and transurethral injection of a bulking agent.   For vaginal atrophy (thinning of the vaginal tissue that can cause dryness and burning) and UTI prevention we discussed estrogen replacement in the form of vaginal cream.   Start vaginal estrogen therapy at night 2 times weekly at night. This can be placed with your finger or an applicator inside the vagina and around the urethra.  Please let us  know if the prescription is too expensive and we can look for alternative options.   Is vaginal estrogen therapy safe for me? Vaginal estrogen preparations act on the vaginal skin, and only a very tiny amount is absorbed into the bloodstream (0.01%).  They work in a similar way to hand or face cream.  There is minimal absorption and they are therefore perfectly safe. If you have had breast cancer and have persistent troublesome symptoms which aren't settling with vaginal moisturisers and lubricants, local estrogen treatment may be a possibility, but consultation with your oncologist should take place first.   The origin of pelvic floor muscle spasm can be multifactorial, including primary, reactive to a different pain source, trauma, or even part of a centralized pain syndrome.Treatment options include pelvic floor physical therapy,  local (vaginal) or oral  muscle relaxants, pelvic muscle trigger point injections or centrally acting pain medications.     Please call [phone] to schedule the earliest appointment for pelvic floor PT.  For treatment of recurrent urinary tract infections, we discussed management of recurrent UTIs including prophylaxis with a daily low dose antibiotic, transvaginal estrogen therapy, D-mannose, and cranberry supplements.  We discussed the role of diagnostic testing such as cystoscopy and upper tract imaging.     Today we talked about ways to manage bladder urgency such as altering your diet to avoid irritative beverages and foods (bladder diet) as well as attempting to decrease stress and other exacerbating factors.  You can also chew a plain Tums 1-3 times per day to make your urine less acidic, especially if you have eating/drinking acidic things.   There is a website with helpful information for people with bladder irritation, called the IC Network at https://www.ic-network.com. This website has more information about a healthy bladder diet and patient forums for support.  The Most Bothersome Foods* The Least Bothersome Foods*  Coffee - Regular & Decaf Tea - caffeinated Carbonated beverages - cola, non-colas, diet & caffeine-free Alcohols - Beer, Red Wine, White Wine, Champagne Fruits - Grapefruit, [place], [place], [employer] - Cranberry, Grapefruit, Orange, Pineapple Vegetables -  Tomato & Tomato Products Flavor Enhancers - Hot peppers, Spicy foods, Chili, Horseradish, Vinegar, Monosodium glutamate (MSG) Artificial Sweeteners - NutraSweet, Sweet 'N Low, Equal (sweetener), Saccharin Ethnic foods - [place], [place], [place] food Water  Milk - low-fat & whole Fruits - Bananas, Blueberries, Honeydew melon, Pears, Raisins, Watermelon Vegetables - Broccoli, [address] Sprouts, [place], [place], Cauliflower, [place], Cucumber, Mushrooms, Peas, Radishes, Squash, Zucchini, White potatoes,  Sweet potatoes & yams Poultry - Chicken, Eggs, [place], [employer] - Beef, Diplomatic Services operational officer, Lamb Seafood - Shrimp, [place] fish, Salmon Grains - Oat, Rice Snacks - Pretzels, Popcorn  *Mitch ALF et al. Diet and its role in interstitial cystitis/bladder pain syndrome (IC/BPS) and comorbid conditions. [employer]. BJU Int. 2012 Jan 11.

## 2024-08-16 ENCOUNTER — Telehealth: Payer: Self-pay

## 2024-08-16 NOTE — Telephone Encounter (Signed)
 Patient called asking about medication change from the Vesicare . I see where you wanted to change it,, but no orders placed, and no instructions for me to order!  She requests this to be sent to [facility] vs mail order is too slow.  Natasha Wright is concerned about waiting for Nov/Dec appointment for PT, she would like to start PT in  [facility]), they offer some pelvic floor therapy. If you are ok with this please send a referral.   Thank you

## 2024-08-17 ENCOUNTER — Other Ambulatory Visit: Payer: Self-pay | Admitting: Obstetrics

## 2024-08-17 DIAGNOSIS — N3946 Mixed incontinence: Secondary | ICD-10-CM

## 2024-08-17 MED ORDER — TROSPIUM CHLORIDE ER 60 MG PO CP24: 1.0000 | ORAL_CAPSULE | Freq: Every day | ORAL | 2 refills | Status: DC

## 2024-08-17 NOTE — Progress Notes (Signed)
 Rx changed to Trospium  60mg  once daily.   If cost prohibitive, change to Trospium  20mg  BID.  Please review side effects of anticholinergic medications and the potential side effects of anticholinergics including dry eyes, dry mouth, constipation, cognitive impairment and urinary retention. Stop medication and seek care immediately if she experiences visual changes or inability to void  If cost prohibitive, please advise pt to visit the website below to sign up for an account. We will need an email address to send along with your prescription to verify your prescription once you have signed up.  [email or web address]  Trospium  Chloride ER Capsule Extended Release  60mg   30 count $31.11  Or    Trospium  Chloride (2 times a day dosing) Tablet  20mg   60 count  $14.03  Can start PT in [place] if preferred if they offer pelvic floor PT. Please notify office if they require a referral.

## 2024-08-21 NOTE — Telephone Encounter (Signed)
 My chart message has been sent to the patient

## 2024-08-23 ENCOUNTER — Other Ambulatory Visit: Payer: Self-pay

## 2024-08-23 DIAGNOSIS — N3946 Mixed incontinence: Secondary | ICD-10-CM

## 2024-08-23 MED ORDER — TROSPIUM CHLORIDE ER 60 MG PO CP24: 1.0000 | ORAL_CAPSULE | Freq: Every day | ORAL | 2 refills | Status: DC

## 2024-08-27 ENCOUNTER — Other Ambulatory Visit: Payer: Self-pay | Admitting: Internal Medicine

## 2024-09-05 ENCOUNTER — Encounter: Payer: Self-pay | Admitting: Dermatology

## 2024-09-10 ENCOUNTER — Encounter: Payer: Self-pay | Admitting: Dermatology

## 2024-09-10 ENCOUNTER — Ambulatory Visit: Admitting: Dermatology

## 2024-09-10 VITALS — BP 127/88 | HR 84 | Temp 98.3°F

## 2024-09-10 DIAGNOSIS — L814 Other melanin hyperpigmentation: Secondary | ICD-10-CM

## 2024-09-10 DIAGNOSIS — D0471 Carcinoma in situ of skin of right lower limb, including hip: Secondary | ICD-10-CM | POA: Diagnosis not present

## 2024-09-10 DIAGNOSIS — L578 Other skin changes due to chronic exposure to nonionizing radiation: Secondary | ICD-10-CM

## 2024-09-10 DIAGNOSIS — C4492 Squamous cell carcinoma of skin, unspecified: Secondary | ICD-10-CM

## 2024-09-10 MED ORDER — MUPIROCIN 2 % EX OINT: 1.0000 | TOPICAL_OINTMENT | Freq: Two times a day (BID) | CUTANEOUS | 0 refills | Status: DC

## 2024-09-10 NOTE — Progress Notes (Signed)
 New Patient Visit   Subjective  Natasha Wright is a 63 y.o. 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   The following portions of the chart were reviewed this encounter and updated as appropriate: medications, allergies, medical history  Review of Systems:  No other skin or systemic complaints except as noted in HPI or Assessment and Plan.  Objective  Well appearing patient in no apparent distress; mood and affect are within normal limits.  A focused examination was performed of the following areas: Right distal pretibial region Relevant physical exam findings are noted in the Assessment and Plan.   right distal pretibial region Hyperkeratotic papule   Assessment & Plan    SQUAMOUS CELL CARCINOMA OF SKIN right distal pretibial region Mohs surgery  Consent obtained: written  Anticoagulation: Was the anticoagulation regimen changed prior to Mohs? No    Anesthesia: Anesthesia method: local infiltration Local anesthetic: lidocaine  1% WITH epi  Procedure Details: Timeout: pre-procedure verification complete Procedure Prep: patient was prepped and draped in usual sterile fashion Prep type: chlorhexidine  Biopsy accession number: [id number] Pre-Op diagnosis: squamous cell carcinoma SCC subtype: in situ MohsAIQ Surgical site (if tumor spans multiple areas, please select predominant area): lower limb (including hip) Surgery side: right Surgical site (from skin exam): right distal pretibial region Pre-operative length (cm): 0.9 Pre-operative width (cm): 0.8 Indications for Mohs surgery: anatomic location where tissue conservation is critical  Micrographic Surgery Details: Post-operative length (cm): 1.2 Post-operative width (cm): 1.2 Number of Mohs stages: 1 Post surgery depth of defect: dermis and subcutaneous fat  Stage 1    Tumor features identified on Mohs  section: no tumor identified  Reconstruction: Was the defect reconstructed?: No    Related Medications mupirocin ointment (BACTROBAN) 2 % Apply 1 Application topically 2 (two) times daily. Apply liberally once daily during bandage change   Return in about 4 weeks (around 10/08/2024) for Follow up.  LILLETTE [name], CMA, am acting as scribe for Natasha CHRISTELLA HOLY, MD.    09/10/2024  HISTORY OF PRESENT ILLNESS  Natasha Wright is seen in consultation at the request of Natasha Phelps, PA-C for biopsy-proven Squamous Carcinoma in Situ on the right pretibial leg. They note that the area has been present for about 6 months increasing in size with time.  There is no history of previous treatment.  Reports no other new or changing lesions and has no other complaints today.  Medications and allergies: see patient chart.  Review of systems: Reviewed 8 systems and notable for the above skin cancer.  All other systems reviewed are unremarkable/negative, unless noted in the HPI. Past medical history, surgical history, family history, social history were also reviewed and are noted in the chart/questionnaire.    PHYSICAL EXAMINATION  General: Well-appearing, in no acute distress, alert and oriented x 4. Vitals reviewed in chart (if available).   Skin: Exam reveals a 0.9 x 0.8 cm erythematous papule and biopsy scar on the right pretibial leg. There are rhytids, telangiectasias, and lentigines, consistent with photodamage.  Biopsy report(s) reviewed, confirming the diagnosis.   ASSESSMENT  1) Squamous Carcinoma in Situ on the right pretibial leg 2) photodamage 3) solar lentigines   PLAN   1. Due to location, size, histology, or recurrence and the likelihood of subclinical extension as well as the need to conserve normal surrounding tissue, the patient was deemed acceptable for Mohs micrographic surgery (MMS).  The nature and purpose of the procedure, associated benefits and risks including  recurrence and scarring, possible complications such as pain, infection, and bleeding, and alternative methods of treatment if appropriate were discussed with the patient during consent. The lesion location was verified by the patient, by reviewing previous notes, pathology reports, and by photographs as well as angulation measurements if available.  Informed consent was reviewed and signed by the patient, and timeout was performed at 10:00 AM. See op note below.  2. For the photodamage and solar lentigines, sun protection discussed/information given on OTC sunscreens, and we recommend continued regular follow-up with primary dermatologist every 6 months or sooner for any growing, bleeding, or changing lesions. 3. Prognosis and future surveillance discussed. 4. Letter with treatment outcome sent to referring provider. 5. Pain acetaminophen/ibuprofen  MOHS MICROGRAPHIC SURGERY AND RECONSTRUCTION  Initial size:   0.9 x 0.8 cm Surgical defect/wound size: 1.2 x 1.2 cm Anesthesia:    0.33% lidocaine  with 1:200,000 epinephrine  EBL:    <5 mL Complications:  None Repair type:   Second Intention  Stages: 1  STAGE I: Anesthesia achieved with 0.5% lidocaine  with 1:200,000 epinephrine . ChloraPrep applied. 1 section(s) excised using Mohs technique (this includes total peripheral and deep tissue margin excision and evaluation with frozen sections, excised and interpreted by the same physician). The tumor was first debulked and then excised with an approx. 2mm margin.  Hemostasis was achieved with electrocautery as needed.  The specimen was then oriented, subdivided/relaxed, inked, and processed using Mohs technique.    Frozen section analysis revealed a clear deep and peripheral margin with partial-thickness atypia of basal keratinocytes with nuclear pleomorphism, disordered maturation, overlying parakeratosis and hyperkeratosis, solar elastosis in the dermis, and a mild lymphocytic infiltrate, without evidence  of full-thickness atypia or dermal invasion  Reconstruction  Patient was notified of results and repair options were discussed, including second intention healing. After reviewing the advantages and disadvantages of each, we agreed on second intention healing as appropriate.   The surgical site was then lightly scrubbed with sterile, saline-soaked gauze.  The area was bandaged using Vaseline ointment, non-adherent gauze, gauze pads, and tape to provide an adequate pressure dressing.   The patient tolerated the procedure well, was given detailed written and verbal wound care instructions, and was discharged in good condition.  The patient will follow-up in 4 weeks and as scheduled with primary dermatologist.  Documentation: I have reviewed the above documentation for accuracy and completeness, and I agree with the above.  Natasha CHRISTELLA HOLY, MD

## 2024-09-10 NOTE — Patient Instructions (Signed)

## 2024-09-19 ENCOUNTER — Other Ambulatory Visit: Payer: Self-pay | Admitting: Medical Genetics

## 2024-09-19 DIAGNOSIS — Z006 Encounter for examination for normal comparison and control in clinical research program: Secondary | ICD-10-CM

## 2024-09-20 ENCOUNTER — Other Ambulatory Visit: Payer: Self-pay

## 2024-09-20 ENCOUNTER — Encounter: Payer: Self-pay | Admitting: Dermatology

## 2024-09-20 DIAGNOSIS — C4492 Squamous cell carcinoma of skin, unspecified: Secondary | ICD-10-CM

## 2024-09-20 MED ORDER — MUPIROCIN 2 % EX OINT: 1.0000 | TOPICAL_OINTMENT | Freq: Two times a day (BID) | CUTANEOUS | 0 refills | Status: AC

## 2024-09-20 NOTE — Progress Notes (Signed)
 Pharmacy wanted clarifications on directions

## 2024-09-24 ENCOUNTER — Ambulatory Visit: Admitting: Dermatology

## 2024-09-24 ENCOUNTER — Encounter: Payer: Self-pay | Admitting: Dermatology

## 2024-09-24 VITALS — BP 131/81 | HR 88 | Temp 97.9°F

## 2024-09-24 DIAGNOSIS — L905 Scar conditions and fibrosis of skin: Secondary | ICD-10-CM | POA: Diagnosis not present

## 2024-09-24 DIAGNOSIS — T1490XD Injury, unspecified, subsequent encounter: Secondary | ICD-10-CM

## 2024-09-24 DIAGNOSIS — L814 Other melanin hyperpigmentation: Secondary | ICD-10-CM | POA: Diagnosis not present

## 2024-09-24 DIAGNOSIS — L578 Other skin changes due to chronic exposure to nonionizing radiation: Secondary | ICD-10-CM | POA: Diagnosis not present

## 2024-09-24 DIAGNOSIS — D0462 Carcinoma in situ of skin of left upper limb, including shoulder: Secondary | ICD-10-CM | POA: Diagnosis not present

## 2024-09-24 DIAGNOSIS — C4492 Squamous cell carcinoma of skin, unspecified: Secondary | ICD-10-CM

## 2024-09-24 NOTE — Patient Instructions (Signed)

## 2024-09-24 NOTE — Progress Notes (Unsigned)
 Follow-Up Visit   Subjective  Natasha Wright is a 63 y.o. 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   The following portions of the chart were reviewed this encounter and updated as appropriate: medications, allergies, medical history  Review of Systems:  No other skin or systemic complaints except as noted in HPI or Assessment and Plan.  Objective  Well appearing patient in no apparent distress; mood and affect are within normal limits.  A focused examination was performed of the following areas: Left distal dorsal forearm Relevant physical exam findings are noted in the Assessment and Plan.   Left distal dorsal Forearm Hyperkeratotic papule   Assessment & Plan   SQUAMOUS CELL CARCINOMA OF SKIN Left distal dorsal Forearm Mohs surgery  Consent obtained: written  Anticoagulation: Is the patient taking prescription anticoagulant and/or aspirin  prescribed/recommended by a physician? No   Was the anticoagulation regimen changed prior to Mohs? No    Anesthesia: Anesthesia method: local infiltration Local anesthetic: lidocaine  1% WITH epi  Procedure Details: Timeout: pre-procedure verification complete Procedure Prep: patient was prepped and draped in usual sterile fashion Prep type: chlorhexidine  Biopsy accession number: [id number] Pre-Op diagnosis: squamous cell carcinoma SCC subtype: in situ MohsAIQ Surgical site (if tumor spans multiple areas, please select predominant area): upper extremity Surgery side: left Surgical site (from skin exam): Left distal dorsal Forearm Pre-operative length (cm): 0.7 Pre-operative width (cm): 0.6 Indications for Mohs surgery: anatomic location where tissue conservation is critical  Micrographic Surgery Details: Post-operative length (cm): 1.4 Post-operative width (cm): 1.1 Number of Mohs stages: 1 Post surgery depth of  defect: subcutaneous fat  Stage 1    Tumor features identified on Mohs section: no tumor identified  Reconstruction: Was the defect reconstructed? Yes   Was reconstruction performed by the same Mohs surgeon? Yes   Setting of reconstruction: outpatient office When was reconstruction performed? same day Type of reconstruction: linear Linear reconstruction: complex  Skin repair Complexity:  Complex Final length (cm):  4.8 Informed consent: discussed and consent obtained   Timeout: patient name, date of birth, surgical site, and procedure verified   Procedure prep:  Patient was prepped and draped in usual sterile fashion Prep type:  Chlorhexidine  Anesthesia: the lesion was anesthetized in a standard fashion   Anesthetic:  1% lidocaine  w/ epinephrine  1-100,000 buffered w/ 8.4% NaHCO3 Reason for type of repair: preserve normal anatomical and functional relationships, avoid adjacent structures and allow side-to-side closure without requiring a flap or graft   Undermining: area extensively undermined   Subcutaneous layers (deep stitches):  Suture size:  4-0 Suture type: Vicryl (polyglactin 910)   Stitches:  Buried vertical mattress Fine/surface layer approximation (top stitches):  Suture type: cyanoacrylate tissue glue   Hemostasis achieved with: suture, pressure and electrodesiccation Hemostasis achieved with comment:  Steri strips Outcome: patient tolerated procedure well with no complications   Post-procedure details: sterile dressing applied and wound care instructions given   Dressing type: pressure dressing and bandage    Related Medications mupirocin ointment (BACTROBAN) 2 % Apply 1 Application topically 2 (two) times daily. Apply liberally twice daily during bandage change HEALING WOUND    Healing Wound s/p Mohs for SCCIS of the right pretibial region, treated on 10/202/2025, healing by second intention - Reassured that wound is healing well, continue ointment daily under  bandage - Discussed that scars take up to 12 months to mature from the date  of surgery - Recommend SPF 30+ to scar daily to prevent purple color - OK to start scar massage at 4-6 weeks post-op - Can consider silicone based products for scar healing  Return in about 4 weeks (around 10/22/2024) for wound check.  LILLETTE [name], CMA, am acting as scribe for [name], MD.    09/26/2024  HISTORY OF PRESENT ILLNESS  Natasha Wright is seen in consultation at the request of Katilin Phelps, PA-C for biopsy-proven Squamous Carcinoma in Situ of the left forearm. They note that the area has been present for about 6 months increasing in size with time.  There is no history of previous treatment.  Reports no other new or changing lesions and has no other complaints today.  Medications and allergies: see patient chart.  Review of systems: Reviewed 8 systems and notable for the above skin cancer.  All other systems reviewed are unremarkable/negative, unless noted in the HPI. Past medical history, surgical history, family history, social history were also reviewed and are noted in the chart/questionnaire.    PHYSICAL EXAMINATION  General: Well-appearing, in no acute distress, alert and oriented x 4. Vitals reviewed in chart (if available).   Skin: Exam reveals a 0.7 x 0.7 cm erythematous papule and biopsy scar on the left forearm. There are rhytids, telangiectasias, and lentigines, consistent with photodamage.  Biopsy report(s) reviewed, confirming the diagnosis.   ASSESSMENT  1) Squamous Carcinoma in Situ of the left forearm 2) photodamage 3) solar lentigines   PLAN   1. Due to location, size, histology, or recurrence and the likelihood of subclinical extension as well as the need to conserve normal surrounding tissue, the patient was deemed acceptable for Mohs micrographic surgery (MMS).  The nature and purpose of the procedure, associated benefits and risks including recurrence and  scarring, possible complications such as pain, infection, and bleeding, and alternative methods of treatment if appropriate were discussed with the patient during consent. The lesion location was verified by the patient, by reviewing previous notes, pathology reports, and by photographs as well as angulation measurements if available.  Informed consent was reviewed and signed by the patient, and timeout was performed at 10:00 AM. See op note below.  2. For the photodamage and solar lentigines, sun protection discussed/information given on OTC sunscreens, and we recommend continued regular follow-up with primary dermatologist every 6 months or sooner for any growing, bleeding, or changing lesions. 3. Prognosis and future surveillance discussed. 4. Letter with treatment outcome sent to referring provider. 5. Pain acetaminophen/ibuprofen  MOHS MICROGRAPHIC SURGERY AND RECONSTRUCTION  Initial size:   0.7 x 0.6 cm Surgical defect/wound size: 1.4 x 1.1 cm Anesthesia:    0.33% lidocaine  with 1:200,000 epinephrine  EBL:    <5 mL Complications:  None Repair type:   Complex SQ suture:   4-0 Vicryl Cutaneous suture:  Cyanoacrylate and Steristrips Final size of the repair: 4.8 cm  Stages: 1  STAGE I: Anesthesia achieved with 0.5% lidocaine  with 1:200,000 epinephrine . ChloraPrep applied. 1 section(s) excised using Mohs technique (this includes total peripheral and deep tissue margin excision and evaluation with frozen sections, excised and interpreted by the same physician). The tumor was first debulked and then excised with an approx. 2mm margin.  Hemostasis was achieved with electrocautery as needed.  The specimen was then oriented, subdivided/relaxed, inked, and processed using Mohs technique.    Frozen section analysis revealed a clear deep and peripheral margin.   Reconstruction  The surgical wound was then cleaned, prepped, and  re-anesthetized as above. Wound edges were undermined extensively  along at least one entire edge and at a distance equal to or greater than the width of the defect (see wound defect size above) in order to achieve closure and decrease wound tension and anatomic distortion. Redundant tissue repair including standing cone removal was performed. Hemostasis was achieved with electrocautery. Subcutaneous and epidermal tissues were approximated with the above sutures. The surgical site was then lightly scrubbed with sterile, saline-soaked gauze. Steri-strips were applied, and the area was then bandaged using Vaseline ointment, non-adherent gauze, gauze pads, and tape to provide an adequate pressure dressing. The patient tolerated the procedure well, was given detailed written and verbal wound care instructions, and was discharged in good condition.   The patient will follow-up: 4 weeks.    Documentation: I have reviewed the above documentation for accuracy and completeness, and I agree with the above.  [name], MD

## 2024-10-08 ENCOUNTER — Ambulatory Visit: Attending: Internal Medicine | Admitting: Internal Medicine

## 2024-10-08 VITALS — BP 129/78 | HR 85 | Ht 66.0 in | Wt 202.0 lb

## 2024-10-08 DIAGNOSIS — J984 Other disorders of lung: Secondary | ICD-10-CM | POA: Diagnosis not present

## 2024-10-08 DIAGNOSIS — I471 Supraventricular tachycardia, unspecified: Secondary | ICD-10-CM

## 2024-10-08 DIAGNOSIS — I493 Ventricular premature depolarization: Secondary | ICD-10-CM | POA: Diagnosis not present

## 2024-10-08 DIAGNOSIS — I10 Essential (primary) hypertension: Secondary | ICD-10-CM | POA: Diagnosis not present

## 2024-10-08 NOTE — Patient Instructions (Signed)
 Medication Instructions:  Your physician recommends that you continue on your current medications as directed. Please refer to the Current Medication list given to you today.  *If you need a refill on your cardiac medications before your next appointment, please call your pharmacy*  Testing/Procedures: Pulmonary Function Tests Pulmonary function tests (PFTs) are breathing tests that are used to: Measure how well your lungs work. Find out what is causing your lung problems. Find the best treatment for you. You may have PFTs: If you have a condition that affects your lungs, such as asthma or chronic obstructive pulmonary disease (COPD). To watch for changes in your lung function over time if you have a long-term (chronic) lung disease. If you are an it trainer. PFTs check the effects of being exposed to chemicals over a long period of time. To check lung function: Before having surgery or other procedures. If you smoke. To check if prescribed medicines or treatments are helping your lungs. Tell a health care provider about: Any allergies you have. All medicines you are taking, including inhaler or nebulizer medicines, vitamins, herbs, eye drops, creams, and over-the-counter medicines. Any bleeding problems you have. Any surgeries you have had, especially recent surgery of the eye, abdomen, or chest. These can make PFTs difficult or unsafe. Any medical conditions you have, including chest pain or heart problems, tuberculosis, or respiratory infections such as pneumonia, a cold, or the flu. Any fear of being in closed spaces (claustrophobia). Some of your tests may be in a closed space. What are the risks? Your health care provider will talk with you about risks. These may include: Feeling light-headed due to fast, deep breathing known as overbreathing or hyperventilation. An asthma attack from deep breathing. What happens before the test? Take over-the-counter and prescription  medicines only as told by your health care provider. If you take inhaler or nebulizer medicines, ask your health care provider which medicines you should take on the day of your testing. Some inhaler medicines may interfere with PFTs if they are taken shortly before the tests. Follow instructions from your health care provider about what you may eat and drink. These may include: Avoiding eating large meals. Avoiding using caffeine before the testing. Not drinkingalcohol for up to 4 hours before the test. Do not use any products that contain nicotine or tobacco for up to 4 hours before your test. These products include cigarettes, chewing tobacco, and vaping devices, such as e-cigarettes. These can affect your test results. If you need help quitting, ask your health care provider. Wear comfortable clothing that will not get in the way of your breathing. Avoid exercise that takes a lot of effort (strenuous exercise) for at least 30 minutes before the test. What happens during the test?  You will be given: A soft noseclip to wear. This allows all of your breaths to go through your mouth instead of your nose. A germ-free (sterile) mouthpiece. It will be attached to a spirometer machine that measures your breathing. You will be asked to do breathing exercises. The exercises will be done by breathing in (inhaling) and breathing out (exhaling). You may have to repeat the exercises many times before the testing is complete. You will need to follow instructions exactly as told to get accurate results. Make sure to blow as hard and as fast as you can when you are told to do so. You may be given a medicine called a bronchodilator. This makes the small air passages in your lungs larger so  you can breathe easier. The tests will be repeated after the medicine takes effect. You will be watched for any problems, such as feeling faint or dizzy, or having trouble breathing. The procedure may vary among health care  providers and hospitals. What can I expect after the test? Your results will be compared with the expected lung function of someone with healthy lungs who is similar to you in several ways. These ways include age, sex, height, weight, and race or ethnicity. This is done to show how your lung function compares with normal lung function (percent predicted). The percent predicted helps your health care provider know if your lung function is normal or not. If you have had PFTs done before, your health care provider will compare your current results with past results. This shows if your lung function is better, worse, or the same as before. It is up to you to get the results of your procedure. Ask your health care provider, or the department that is doing the procedure, when your results will be ready. After you get your results, talk with your health care provider about treatment options, if necessary. This information is not intended to replace advice given to you by your health care provider. Make sure you discuss any questions you have with your health care provider. Document Revised: 05/31/2022 Document Reviewed: 05/31/2022 [employer] Patient Education  2024 [employer]  Follow-Up: At [facility], you and your health needs are our priority.  As part of our continuing mission to provide you with exceptional heart care, our providers are all part of one team.  This team includes your primary Cardiologist (physician) and Advanced Practice Providers or APPs (Physician Assistants and Nurse Practitioners) who all work together to provide you with the care you need, when you need it.  Your next appointment:   6 month(s)  Provider:   Stanly DELENA Leavens, MD

## 2024-10-08 NOTE — Progress Notes (Signed)
 Cardiology Office Note:  .    Date:  10/08/2024  ID:  Natasha Wright, DOB Oct 09, 1961, MRN [id number] PCP: Bertell Satterfield, MD  [facility] Providers Cardiologist:  Stanly DELENA Leavens, MD     CC: F/u SVT  History of Present Illness: .    Natasha Wright is a 63 y.o. female  with supraventricular tachycardia who presents with episodes of elevated heart rate and chest pain.   Natasha Wright is a 63 year old female who presents for follow-up on multiple health issues including shortness of breath and pelvic floor prolapse.  She has a history of supraventricular tachycardia and is currently on atenolol  25 mg and flecainide  50 mg BID. She feels 'pretty terrible' on flecainide  and did not tolerate AV nodal increase in the past (See note after KJ's visit) She has noticed increased shortness of breath, particularly with exertion, since starting flecainide . Her heart rate increases to the 120s with activity, and she feels out of breath after walking short distances.  She has a history of restrictive lung disease, which was identified after a CT scan showed scarring in the lower lobes of both lungs. She is less out of breath recently but still experiences significant exertional dyspnea.  Has not see Dr. Chari team in some time.  She is dealing with pelvic floor prolapse and has had to delay treatment due to other health issues. She experiences frequent urinary tract infections.  She has been diagnosed with esophageal dysphagia and has made dietary changes to manage her symptoms, particularly avoiding greasy foods due to liver issues. She feels very sick if she consumes greasy foods.  She has recently undergone treatment for skin cancer, having had three lesions excised and stitched.  She is experiencing significant life stressors, including caring for her mother in hospice and her elderly father, as well as financial stress related to home repairs. She feels overwhelmed and has  some anxiety, which is being managed with medication.  She is on spironolactone  25 mg daily for blood pressure, which is well controlled, and rosuvastatin 10 mg daily for cholesterol management.   Relevant histories: .  Social - son had congenital arrhythmia issues and sees Dr. Reyna ([id number]) ROS: As per HPI.   Studies Reviewed: .   Cardiac Studies & Procedures   ______________________________________________________________________________________________   STRESS TESTS  MYOCARDIAL PERFUSION IMAGING 03/22/2024  Interpretation Summary   The study is normal. The study is low risk.   No ST deviation was noted.   LV perfusion is normal.   Left ventricular function is normal. Nuclear stress EF: 63%. The left ventricular ejection fraction is normal (55-65%). End diastolic cavity size is normal. End systolic cavity size is normal.   Prior study available for comparison from 06/05/2020. There are changes compared to prior study. The left ventricular ejection fraction has increased.  Normal perfusion. LVEF 63% with normal wall motion. This is a low risk study. Compared to a prior study in 2021, the LVEF has increased.   ECHOCARDIOGRAM  ECHOCARDIOGRAM COMPLETE 02/29/2024  Narrative ECHOCARDIOGRAM REPORT    Patient Name:   Natasha Wright Date of Exam: 02/29/2024 Medical Rec #:  [id number]        Height:       68.0 in Accession #:    [id number]       Weight:       190.0 lb Date of Birth:  [birth year]       BSA:  2.000 m Patient Age:    62 years         BP:           124/72 mmHg Patient Gender: F                HR:           76 bpm. Exam Location:  [address]  Procedure: 2D Echo, Cardiac Doppler and Color Doppler (Both Spectral and Color Flow Doppler were utilized during procedure).  Indications:    R00.2 Palpitations  History:        Patient has prior history of Echocardiogram examinations, most recent 03/29/2022. CHF and LVH,  Arrythmias:PVC, Signs/Symptoms:Palpitations; Risk Factors:Hypertension and Former Smoker.  Sonographer:    Elsie Bohr RDCS Referring Phys: [id number] ROLLO JONELLE LOUDER  IMPRESSIONS   1. Left ventricular ejection fraction, by estimation, is 60 to 65%. The left ventricle has normal function. The left ventricle has no regional wall motion abnormalities. Left ventricular diastolic parameters were normal. 2. Right ventricular systolic function is normal. The right ventricular size is normal. 3. Left atrial size was mildly dilated. 4. The mitral valve is abnormal. Trivial mitral valve regurgitation. No evidence of mitral stenosis. 5. The aortic valve was not well visualized. Aortic valve regurgitation is not visualized. No aortic stenosis is present. 6. The inferior vena cava is normal in size with greater than 50% respiratory variability, suggesting right atrial pressure of 3 mmHg.  FINDINGS Left Ventricle: Left ventricular ejection fraction, by estimation, is 60 to 65%. The left ventricle has normal function. The left ventricle has no regional wall motion abnormalities. Strain was performed and the global longitudinal strain is indeterminate. The left ventricular internal cavity size was normal in size. There is no left ventricular hypertrophy. Left ventricular diastolic parameters were normal.  Right Ventricle: The right ventricular size is normal. No increase in right ventricular wall thickness. Right ventricular systolic function is normal.  Left Atrium: Left atrial size was mildly dilated.  Right Atrium: Right atrial size was normal in size.  Pericardium: There is no evidence of pericardial effusion.  Mitral Valve: The mitral valve is abnormal. There is mild thickening of the mitral valve leaflet(s). There is mild calcification of the mitral valve leaflet(s). Mild mitral annular calcification. Trivial mitral valve regurgitation. No evidence of mitral valve stenosis.  Tricuspid  Valve: The tricuspid valve is normal in structure. Tricuspid valve regurgitation is not demonstrated. No evidence of tricuspid stenosis.  Aortic Valve: The aortic valve was not well visualized. Aortic valve regurgitation is not visualized. No aortic stenosis is present.  Pulmonic Valve: The pulmonic valve was normal in structure. Pulmonic valve regurgitation is not visualized. No evidence of pulmonic stenosis.  Aorta: The aortic root is normal in size and structure.  Venous: The inferior vena cava is normal in size with greater than 50% respiratory variability, suggesting right atrial pressure of 3 mmHg.  IAS/Shunts: No atrial level shunt detected by color flow Doppler.  Additional Comments: 3D was performed not requiring image post processing on an independent workstation and was indeterminate.   LEFT VENTRICLE PLAX 2D LVIDd:         4.00 cm   Diastology LVIDs:         2.50 cm   LV e' medial:    9.46 cm/s LV PW:         1.10 cm   LV E/e' medial:  9.1 LV IVS:        0.90 cm   LV  e' lateral:   9.14 cm/s LVOT diam:     2.00 cm   LV E/e' lateral: 9.5 LV SV:         54 LV SV Index:   27 LVOT Area:     3.14 cm   RIGHT VENTRICLE             IVC RV S prime:     11.30 cm/s  IVC diam: 1.10 cm TAPSE (M-mode): 2.1 cm  LEFT ATRIUM             Index        RIGHT ATRIUM           Index LA diam:        3.90 cm 1.95 cm/m   RA Pressure: 3.00 mmHg LA Vol (A2C):   41.8 ml 20.90 ml/m  RA Area:     13.60 cm LA Vol (A4C):   38.7 ml 19.35 ml/m  RA Volume:   32.70 ml  16.35 ml/m LA Biplane Vol: 42.3 ml 21.15 ml/m AORTIC VALVE LVOT Vmax:   79.00 cm/s LVOT Vmean:  53.000 cm/s LVOT VTI:    0.173 m  AORTA Ao Root diam: 3.20 cm Ao Asc diam:  2.80 cm  MITRAL VALVE               TRICUSPID VALVE MV Area (PHT): 2.91 cm    Estimated RAP:  3.00 mmHg MV Decel Time: 261 msec MV E velocity: 86.50 cm/s  SHUNTS MV A velocity: 99.00 cm/s  Systemic VTI:  0.17 m MV E/A ratio:  0.87        Systemic  Diam: 2.00 cm  Maude Emmer MD Electronically signed by Maude Emmer MD Signature Date/Time: 02/29/2024/8:23:10 AM    Final    MONITORS  LONG TERM MONITOR (3-14 DAYS) 03/05/2024  Narrative   Patient had a minimum heart rate of 56 bpm, maximum heart rate of 149 bpm, and average heart rate of 87 bpm. Predominant underlying rhythm was sinus rhythm. One run of SVT- 149 bpm, seven beats long. Isolated PACs were rare (<1.0%). Isolated PVCs were occasional (2.0 %). No evidence of significant heart block . Triggered and diary events associated with sinus rhythm, sinus tachycardia, and PVC. There is not a correlation between patient triggers and relative increase in PVC burden.  No malignant arrhythmias.   CT SCANS  CT CORONARY MORPH W/CTA COR W/SCORE 09/30/2022  Addendum 09/30/2022  2:29 PM ADDENDUM REPORT: 09/30/2022 14:27  EXAM: OVER-READ INTERPRETATION  CT CHEST  The following report is an over-read performed by radiologist Dr. Lynwood Seip  [facility] on 09/30/2022. This over-read does not include interpretation of cardiac or coronary anatomy or pathology. The coronary CTA interpretation by the cardiologist is attached.  COMPARISON:  None.  FINDINGS: The visualized portions of the extracardiac vascular structures are unremarkable. Visualized mediastinum is unremarkable. Visualized portion of upper abdomen is unremarkable. Visualized pulmonary parenchyma is unremarkable. Visualized skeleton is unremarkable.  IMPRESSION: No definite abnormality seen involving the visualized extracardiac structures of the chest.   Electronically Signed By: Lynwood Seip Raddle M.D. On: 09/30/2022 14:27  Narrative CLINICAL DATA:  63 yo female with chest pain  EXAM: Cardiac/Coronary CTA  TECHNIQUE: A non-contrast, gated CT scan was obtained with axial slices of 3 mm through the heart for calcium scoring. Calcium scoring was performed using the Agatston method. A 120  kV prospective, gated, contrast cardiac scan was obtained. Gantry rotation speed was 250 msecs and collimation was 0.6  mm. Two sublingual nitroglycerin  tablets (0.8 mg) were given. The 3D data set was reconstructed in 5% intervals of the 35-75% of the R-R cycle. Diastolic phases were analyzed on a dedicated workstation using MPR, MIP, and VRT modes. The patient received 95 cc of contrast.  FINDINGS: Image quality: Excellent.  Noise artifact is: Limited.  Coronary Arteries:  Normal coronary origin.  Right dominance.  Left main: The left main is a large caliber vessel with a normal take off from the left coronary cusp that bifurcates to form a left anterior descending artery and a left circumflex artery. There is no plaque or stenosis.  Left anterior descending artery: The LAD is patent without evidence of plaque or stenosis. The LAD gives off 1 patent diagonal branch.  Left circumflex artery: The LCX is non-dominant and patent with no evidence of plaque or stenosis. The LCX gives off 1 patent obtuse marginal branch.  Right coronary artery: The RCA is dominant with normal take off from the right coronary cusp. There is no evidence of plaque or stenosis. The RCA terminates as a PDA and right posterolateral branch without evidence of plaque or stenosis.  Right Atrium: Right atrial size is within normal limits.  Right Ventricle: The right ventricular cavity is within normal limits.  Left Atrium: Left atrial size is normal in size with no left atrial appendage filling defect.  Left Ventricle: The ventricular cavity size is within normal limits. There are no stigmata of prior infarction. There is no abnormal filling defect.  Pulmonary arteries: Normal in size without proximal filling defect.  Pulmonary veins: Normal pulmonary venous drainage.  Pericardium: Normal thickness with no significant effusion or calcium present.  Cardiac valves: The aortic valve is trileaflet  without significant calcification. The mitral valve is normal structure without significant calcification.  Aorta: Normal caliber with no significant disease.  Extra-cardiac findings: See attached radiology report for non-cardiac structures.  IMPRESSION: 1. Coronary calcium score of 0. This was 0 percentile for age-, sex, and race-matched controls.  2. Normal coronary origin with right dominance.  3. No evidence of CAD.  RECOMMENDATIONS: 1 CAD-RADS 0: No evidence of CAD (0%). Consider non-atherosclerotic causes of chest pain.  Redell Shallow, MD  Electronically Signed: By: Redell Shallow M.D. On: 09/30/2022 14:12     ______________________________________________________________________________________________       Physical Exam:    VS:  BP 129/78   Pulse 85   Ht 5' 6 (1.676 m)   Wt 202 lb (91.6 kg)   SpO2 95%   BMI 32.60 kg/m    Wt Readings from Last 3 Encounters:  10/08/24 202 lb (91.6 kg)  08/13/24 203 lb 9.6 oz (92.4 kg)  07/10/24 201 lb 6.4 oz (91.4 kg)    Gen: Mild distress Neck: No JVD Cardiac: No Rubs or Gallops, no murmur, RRR +2 radial pulses Respiratory: Clear to auscultation bilaterally, normal effort, normal  respiratory rate GI: Soft, nontender, non-distended  MS: No edema;  moves all extremities Integument: Skin feels warm, brandages from biopsy sites Neuro:  At time of evaluation, alert and oriented to person/place/time/situation  Psych: Appropriate affect given her cancer diagnosis   ASSESSMENT AND PLAN: .    Supraventricular tachycardia Managed with atenolol  and flecainide . Reports increased exertional dyspnea, possibly related to flecainide . Heart rate increases to the 120s with exertion. No significant mitral valve regurgitation on examination. - Continue atenolol  25 mg daily - Continue flecainide  50 mg BID  Restrictive lung disease with pulmonary fibrosis Likely secondary to previous COVID-19  infection. Reports significant  exertional dyspnea, possibly related to restrictive lung disease. Last pulmonary function test was in 2015. Recent CT scan showed scarring in the lower lobes of both lungs. - Ordered pulmonary function test to assess current lung function; may need new PPCM eval  Anxiety disorder Exacerbated by personal stressors including family responsibilities and health issues. Anxiety is managed with medication. - we discussed all of the work she is doing for other people; she has a lot on her plate and her anxiety makes sense  Hypertension - Continue spironolactone  25 mg daily  Hyperlipidemia Aortic atherosclerosis Managed with rosuvastatin. Reports dietary changes to reduce fat intake due to liver issues. - Continue rosuvastatin 10 mg daily  Longitudinal care: The evaluation and management services provided today reflect the complexity inherent in caring for this patient, including the ongoing longitudinal relationship and management of multiple chronic conditions and/or the need for care coordination. The visit required a comprehensive assessment and management plan tailored to the patient's unique needs Time was spent addressing not only the acute concerns but also the broader context of the patient's health, including preventive care, chronic disease management, and care coordination as appropriate.  Complex longitudinal is necessary for conditions including: SVT with severe medication intolerances; she provider strength to the rest of her family but this make care of her more challenging   Six months with me   Stanly Leavens, MD FASE [employer] Cardiologist [facility]  [address] [place], [place] [address] [phone]  11:05 AM

## 2024-10-09 ENCOUNTER — Encounter: Payer: Self-pay | Admitting: Dermatology

## 2024-10-09 ENCOUNTER — Ambulatory Visit: Admitting: Dermatology

## 2024-10-09 DIAGNOSIS — Z85828 Personal history of other malignant neoplasm of skin: Secondary | ICD-10-CM | POA: Diagnosis not present

## 2024-10-09 DIAGNOSIS — L820 Inflamed seborrheic keratosis: Secondary | ICD-10-CM | POA: Diagnosis not present

## 2024-10-09 DIAGNOSIS — L539 Erythematous condition, unspecified: Secondary | ICD-10-CM

## 2024-10-09 DIAGNOSIS — C4492 Squamous cell carcinoma of skin, unspecified: Secondary | ICD-10-CM

## 2024-10-09 DIAGNOSIS — T1490XD Injury, unspecified, subsequent encounter: Secondary | ICD-10-CM

## 2024-10-09 NOTE — Patient Instructions (Addendum)

## 2024-10-09 NOTE — Progress Notes (Signed)
   Follow Up Visit   Subjective  Natasha Wright is a 63 y.o. 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 The patient has been bandaging the wound as directed. The endorse the following concerns: none  She is also s/p Mohs for a SCCS of the right pretibial region, treated on 09/10/2024, healing by second intention.  The following portions of the chart were reviewed this encounter and updated as appropriate: medications, allergies, medical history  Review of Systems:  No other skin or systemic complaints except as noted in HPI or Assessment and Plan.  Objective  Well appearing patient in no apparent distress; mood and affect are within normal limits.  A focal examination was performed including scalp, head, face and left distal dorsal forearm. All findings within normal limits unless otherwise noted below.  Healing wound with mild erythema  Relevant physical exam findings are noted in the Assessment and Plan.     Left Buccal Cheek Stuck on waxy tan papule with erythema  Assessment & Plan   Healing Wound s/p Mohs for SCCIS of the right pretibial region, treated on 09/10/2024, healing by second intention - Reassured that wound is healing well, continue ointment daily under bandage - Discussed that scars take up to 12 months to mature from the date of surgery - Recommend SPF 30+ to scar daily to prevent purple color - OK to start scar massage at 4-6 weeks post-op - Can consider silicone based products for scar healing - Continue daily mupirocin  Healing Wound s/p Mohs for SCC on the left distal forearm, treated on 09/24/24, repaired with linear closure - Reassured that wound is healing well - No evidence of infection - No swelling, induration, purulence, dehiscence, or tenderness out of proportion to the  clinical exam, see photo above - Discussed that scars take up to 12 months to mature from the date of surgery - Recommend SPF 30+ to scar daily to prevent purple color from UV exposure during scar maturation process - Discussed that erythema and raised appearance of scar will fade over the next 4-6 months - OK to start scar massage at 4-6 weeks post-op - Can consider silicone based products for scar healing starting at 6 weeks post-op - Ok to continue ointment daily to wound under a bandage for another week  HISTORY OF SQUAMOUS CELL CARCINOMA OF THE SKIN - No evidence of recurrence today - Recommend regular full body skin exams - Recommend daily broad spectrum sunscreen SPF 30+ to sun-exposed areas, reapply every 2 hours as needed.  - Call if any new or changing lesions are noted between office visits  INFLAMED SEBORRHEIC KERATOSIS Left Buccal Cheek Destruction of lesion - Left Buccal Cheek Complexity: simple   Destruction method: cryotherapy   Lesion destroyed using liquid nitrogen: Yes   Outcome: patient tolerated procedure well with no complications   Post-procedure details: wound care instructions given     Return in about 1 month (around 11/08/2024) for TBSE.  I, [name], CMA, am acting as scribe for [name], MD.   Documentation: I have reviewed the above documentation for accuracy and completeness, and I agree with the above.  [name], MD

## 2024-10-15 ENCOUNTER — Ambulatory Visit (HOSPITAL_COMMUNITY)
Admission: RE | Admit: 2024-10-15 | Discharge: 2024-10-15 | Disposition: A | Discharge status: Home / Self Care | Source: Ambulatory Visit | Attending: Internal Medicine | Admitting: Internal Medicine

## 2024-10-15 DIAGNOSIS — J984 Other disorders of lung: Secondary | ICD-10-CM | POA: Diagnosis present

## 2024-10-15 LAB — PULMONARY FUNCTION TEST
DL/VA % pred: 101 %
DL/VA: 4.14 ml/min/mmHg/L
DLCO unc % pred: 85 %
DLCO unc: 18.39 ml/min/mmHg
FEF 25-75 Post: 2.89 L/s
FEF 25-75 Pre: 2.25 L/s
FEF2575-%Change-Post: 28 %
FEF2575-%Pred-Post: 127 %
FEF2575-%Pred-Pre: 98 %
FEV1-%Change-Post: 6 %
FEV1-%Pred-Post: 81 %
FEV1-%Pred-Pre: 76 %
FEV1-Post: 2.17 L
FEV1-Pre: 2.04 L
FEV1FVC-%Change-Post: 5 %
FEV1FVC-%Pred-Pre: 106 %
FEV6-%Change-Post: 1 %
FEV6-%Pred-Post: 74 %
FEV6-%Pred-Pre: 73 %
FEV6-Post: 2.5 L
FEV6-Pre: 2.46 L
FEV6FVC-%Change-Post: 0 %
FEV6FVC-%Pred-Post: 104 %
FEV6FVC-%Pred-Pre: 103 %
FVC-%Change-Post: 0 %
FVC-%Pred-Post: 71 %
FVC-%Pred-Pre: 71 %
FVC-Post: 2.5 L
FVC-Pre: 2.48 L
Post FEV1/FVC ratio: 87 %
Post FEV6/FVC ratio: 100 %
Pre FEV1/FVC ratio: 82 %
Pre FEV6/FVC Ratio: 99 %
RV % pred: 19 %
RV: 0.43 L
TLC % pred: 81 %
TLC: 4.45 L

## 2024-10-15 MED ORDER — ALBUTEROL SULFATE (2.5 MG/3ML) 0.083% IN NEBU
2.5000 mg | INHALATION_SOLUTION | Freq: Once | RESPIRATORY_TRACT | Status: AC
  Administered 2024-10-15: 2.5 mg via RESPIRATORY_TRACT

## 2024-10-17 ENCOUNTER — Ambulatory Visit: Payer: Self-pay

## 2024-10-17 NOTE — Telephone Encounter (Signed)
 FYI Only or Action Required?: FYI only for provider: new pt.  Patient is followed in Pulmonology for n/a, last seen on n/a.  Called Nurse Triage reporting Shortness of Breath.  Symptoms began several years ago.  Interventions attempted: Nothing.  Symptoms are: gradually worsening.  Triage Disposition: See HCP Within 4 Hours (Or PCP Triage)  Patient/caregiver understands and will follow disposition?: Yes    Copied from CRM #[id number]. Topic: Clinical - Red Word Triage >> Oct 17, 2024 12:35 PM Natasha Wright wrote: Red Word that prompted transfer to Nurse Triage: Patient [phone] states her cardiologist Dr. Milan nurse informed patient was diagnosis with restricitive lung disease and to see a pulmonologist, however Dr. Santo is out of the country and they cannot and cannot do a referral for patient at this time. NPT restricitive lung disease, SELF-REFERRAL Patient states shortness of breath, feeling like running all the time, lungs pain when taking a deep breath, wheezing, dizziness, denies a fever. Please advise.     ----------------------------------------------------------------------- From previous Reason for Contact - Scheduling: Patient/patient representative is calling to schedule an appointment. Refer to attachments for appointment information. Reason for Disposition  [1] MILD difficulty breathing (e.g., minimal/no SOB at rest, SOB with walking, pulse < 100) AND [2] NEW-onset or WORSE than normal  Answer Assessment - Initial Assessment Questions Pt is calling to schedule as a new pulm patient. She states she was recently diagnosed with restrictive lung disease. She states she has shortness of breath all the time but it is worse with exertion. She states if she walks out to her mailbox and back, she has to sit down to catch her breath and can hear a whistling in her chest. She does check her HR and oxygen at home. She states it is typically 90-94%. She states today  it was 94%. She does feel like she is mouth breathing now especially when she is exerting herself because she does not feel like she is getting enough oxygen through her nose. She states all of her symptoms started after she got covid 2 years ago. She states she has had a ct in the last year that shows scarring on both her lungs. She states she has noticed a worsening in symptoms over the last 6 months. She states that she said something to her cardiologist who ordered the PFT last visit. She states on that day she was walking to his office from the car and had to stop and sit down for a while to catch her breath, she said it was the worse episode she has had yet. She denies any chest pain. States she does sometimes have a cough and coughs up white phlegm but it is intermittent.  Rn did give advise on when to go to the Er. Pt stated understanding. Sent to [facility] to schedule new pt appt.    1. RESPIRATORY STATUS: Describe your breathing? (e.g., wheezing, shortness of breath, unable to speak, severe coughing)      Today she states is a good day but she does get short of breath 2. ONSET: When did this breathing problem begin?      2 years ago, worse over the last 6 months 3. PATTERN Does the difficult breathing come and go, or has it been constant since it started?      She says all the time just changes in how bad 4. SEVERITY: How bad is your breathing? (e.g., mild, moderate, severe)      Today mild 5. RECURRENT SYMPTOM: Have you  had difficulty breathing before? If Yes, ask: When was the last time? and What happened that time?      Yes the last 2 years 6. CARDIAC HISTORY: Do you have any history of heart disease? (e.g., heart attack, angina, bypass surgery, angioplasty)      CHF 7. LUNG HISTORY: Do you have any history of lung disease?  (e.g., pulmonary embolus, asthma, emphysema)     Recently diagnosed with restrictive airway disease 8. CAUSE: What do you think is causing the  breathing problem?      covid 9. OTHER SYMPTOMS: Do you have any other symptoms? (e.g., chest pain, cough, dizziness, fever, runny nose)     Cough some times 10. O2 SATURATION MONITOR:  Do you use an oxygen saturation monitor (pulse oximeter) at home? If Yes, ask: What is your reading (oxygen level) today? What is your usual oxygen saturation reading? (e.g., 95%)       94  Protocols used: Breathing Difficulty-A-[facility]

## 2024-10-21 NOTE — Progress Notes (Signed)
 New Patient Pulmonology Office Visit   Subjective:  Patient ID: Natasha Wright, female    DOB: 1961/04/11  MRN: [id number]  Referred by: Shona Norleen PEDLAR, MD  CC: No chief complaint on file.   HPI Natasha Wright is a 63 y.o. female with SVT, pelvic floor prolapse  Dyspnea on exertion Restrictive lung disease with pulmonary fibrosis Likely secondary to previous COVID-19 infection. Reports significant exertional dyspnea, possibly related to restrictive lung disease. Last pulmonary function test was in 2015. Recent CT scan showed scarring in the lower lobes of both lungs. - Ordered pulmonary function test to assess current lung function; may need new PPCM eval   {PULM QUESTIONNAIRES (Optional):33196}  ROS  Allergies: Penicillins, Sulfa antibiotics, Sulfamethoxazole-trimethoprim, Aspirin , Codeine, Fentanyl , Ibuprofen, Morphine, Adhesive [tape], Influenza vaccines, Latex, Nsaids, and Other  Current Outpatient Medications:    acetaminophen (TYLENOL) 650 MG CR tablet, Take 1,300 mg by mouth 2 (two) times daily as needed for pain., Disp: , Rfl:    atenolol  (TENORMIN ) 25 MG tablet, TAKE 1 TABLET BY MOUTH DAILY, Disp: 100 tablet, Rfl: 2   azelastine (ASTELIN) 0.1 % nasal spray, as needed for allergies., Disp: , Rfl:    budesonide (RHINOCORT AQUA) 32 MCG/ACT nasal spray, as needed for allergies., Disp: , Rfl:    citalopram  (CELEXA ) 20 MG tablet, Take 1 tablet (20 mg total) by mouth 2 (two) times daily., Disp: 180 tablet, Rfl: 3   colestipol  (COLESTID ) 1 g tablet, Take 1 tablet (1 g total) by mouth 2 (two) times daily. Do not take within 2 hours of other medications, Disp: 180 tablet, Rfl: 3   dicyclomine (BENTYL) 20 MG tablet, Take 20 mg by mouth 4 (four) times daily as needed for spasms. Rarely takes., Disp: , Rfl:    estradiol  (ESTRACE ) 0.1 MG/GM vaginal cream, Place 0.5g at night twice a week, Disp: 30 g, Rfl: 3   flecainide  (TAMBOCOR ) 50 MG tablet, Take 1 tablet (50 mg total) by mouth 2  (two) times daily., Disp: 180 tablet, Rfl: 3   fluticasone (FLONASE) 50 MCG/ACT nasal spray, Place 1 spray into both nostrils 2 (two) times daily., Disp: , Rfl:    furosemide  (LASIX ) 20 MG tablet, Take 20 mg by mouth daily., Disp: , Rfl:    ipratropium (ATROVENT) 0.06 % nasal spray, Place 2 sprays into both nostrils daily., Disp: , Rfl:    mirtazapine (REMERON) 15 MG tablet, Take 15 mg by mouth at bedtime., Disp: , Rfl:    Multiple Vitamin (MULTIVITAMIN WITH MINERALS) TABS tablet, Take 1 tablet by mouth daily., Disp: , Rfl:    mupirocin  ointment (BACTROBAN ) 2 %, Apply 1 Application topically 2 (two) times daily. Apply liberally twice daily during bandage change, Disp: 22 g, Rfl: 0   pantoprazole  (PROTONIX ) 40 MG tablet, TAKE 1 TABLET BY MOUTH 30 MINUTES PRIOR TO MEALS TWICE DAILY, Disp: 60 tablet, Rfl: 11   rosuvastatin (CRESTOR) 10 MG tablet, Take 10 mg by mouth at bedtime., Disp: , Rfl:    spironolactone  (ALDACTONE ) 25 MG tablet, TAKE 1 TABLET BY MOUTH DAILY, Disp: 100 tablet, Rfl: 3   tacrolimus (PROTOPIC) 0.1 % ointment, Apply topically 2 (two) times daily., Disp: , Rfl:    triamcinolone  ointment (KENALOG ) 0.1 %, Apply 1 Application topically 2 (two) times daily., Disp: , Rfl:    Trospium  Chloride 60 MG CP24, Take 1 capsule (60 mg total) by mouth daily., Disp: 30 capsule, Rfl: 2 Past Medical History:  Diagnosis Date   Anxiety  Arthritis    B12 deficiency    h/o   Carcinoma (HCC)    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, 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   Family history of heart disease    GERD (gastroesophageal reflux disease)    Hypoglycemia    IBS (irritable bowel syndrome)    Lumbar disc disease    OA (osteoarthritis)    PONV (postoperative nausea and vomiting)    needs scop patch   Rectocele    Shingles    Skin cancer    Spinal stenosis in cervical region    Past Surgical History:  Procedure Laterality Date   ARTHRODESIS FOOT WITH WEIL OSTEOTOMY Left 05/09/2014   Procedure: LEFT FIRST  TARSAL METATARSAL  ARTHRODESIS; LEFT SECOND WEIL OSTEOTOMY;  Surgeon: Norleen Armor, MD;  Location: [facility];  Service: Orthopedics;  Laterality: Left;   BIOPSY  07/11/2018   Procedure: BIOPSY;  Surgeon: Harvey Margo CROME, MD;  [facility];  Service: Endoscopy;;  duodenal biopsy , gastric biopsy    BIOPSY  07/04/2023   Procedure: BIOPSY;  Surgeon: Cindie Carlin POUR, DO;  [facility]: [facility];  Service: Endoscopy;;   BUNIONECTOMY WITH HAMMERTOE RECONSTRUCTION Left 05/09/2014   Procedure: LEFT MODIFIED MCBRIDE BUNIONECTOMY; LEFT SECOND HAMMER TOE CORRECTION;  Surgeon: Norleen Armor, MD;  Location: [facility];  Service: Orthopedics;  Laterality: Left;   CERVICAL SPINE SURGERY  2009   CHOLECYSTECTOMY  2004   lap choli   COLONOSCOPY  07/08/2003   RMR: Left-sided diverticula, remainder of colonic mucosa and terminal ileum appeared normal.. Minimally friable internal hemorrhoids, otherwise normal rectum   COLONOSCOPY N/A 04/30/2013   Dr. Harvey: mild diverticulosis in descending and sigmoid colon, internal hemorrhoids.    COLONOSCOPY WITH PROPOFOL  N/A 07/04/2023   Procedure: COLONOSCOPY WITH PROPOFOL ;  Surgeon: Cindie Carlin POUR, DO;  Location: [facility];  Service: Endoscopy;  Laterality: N/A;  10:45 AM, ASA 3, pt knows to arrive at 8:00   DILATION AND CURETTAGE OF UTERUS     ESOPHAGOGASTRODUODENOSCOPY (EGD) WITH PROPOFOL  N/A 07/11/2018   Procedure: ESOPHAGOGASTRODUODENOSCOPY (EGD) WITH PROPOFOL ;  Surgeon: Harvey Margo CROME, MD;  [facility]: [facility];  Service: Endoscopy;  Laterality: N/A;  9:30am   KNEE ARTHROSCOPY Right    LAPAROSCOPIC HYSTERECTOMY  1992   NASAL SINUS SURGERY     NM MYOCAR PERF WALL MOTION  10/2011   bruce myoview  - breast attenuation noted in anterior region; EF 65%; no ischemia/infarct/scar; low risk   POLYPECTOMY  07/04/2023   Procedure: POLYPECTOMY;  Surgeon: Cindie Carlin POUR, DO;  [facility];  Service: Endoscopy;;    right ankle surgery     SAVORY DILATION N/A 07/11/2018   Procedure: SAVORY DILATION;  Surgeon: Harvey Margo CROME, MD;  [facility];  Service: Endoscopy;  Laterality: N/A;   SHOULDER SURGERY     TONSILLECTOMY  1980   TOTAL LAPAROSCOPIC HYSTERECTOMY WITH BILATERAL SALPINGO OOPHORECTOMY  1992   TUBAL LIGATION     Family History  Problem Relation Age of Onset   Heart disease Paternal Grandfather    Stroke Paternal Grandmother    Diabetes Paternal Grandmother    Heart disease Paternal Grandmother    Breast cancer Maternal Grandmother    Hypertension Maternal Grandmother    Diabetes Maternal Grandmother    Stroke Maternal Grandmother    Asthma Maternal Grandmother    Stroke Maternal Grandfather    Ulcerative colitis Father    Diabetes Mother    Hypertension Mother  Hyperlipidemia Mother    Alzheimer's disease Mother    Hyperlipidemia Brother    Hyperlipidemia Sister    Hypertension Sister    Diabetes Sister    Down syndrome Child    Heart disease Child    Colon cancer Neg Hx    Social History   Socioeconomic History   Marital status: Married    Spouse name: Jayson   Number of children: 1   Years of education: 14   Highest education level: Not on file  Occupational History   Occupation: Unemployed    Employer: [employer]    Employer: UNEMPLOYED  Tobacco Use   Smoking status: Former    Current packs/day: 0.00    Average packs/day: 0.5 packs/day for 40.0 years (20.0 ttl pk-yrs)    Types: Cigarettes    Start date: 12/24/1973    Quit date: 12/24/2013    Years since quitting: 10.8   Smokeless tobacco: Never   Tobacco comments:    using vape as aid to sustain from smoking  Vaping Use   Vaping status: Never Used  Substance and Sexual Activity   Alcohol use: No   Drug use: No   Sexual activity: Not Currently    Birth control/protection: None, Surgical    Comment: hysy  Other Topics Concern   Not on file  Social History Narrative   One son,  Down syndrome,is right handed ,resides with son and husband   Right handed   Social Drivers of Health   Financial Resource Strain: Low Risk  (06/19/2024)   Overall Financial Resource Strain (CARDIA)    Difficulty of Paying Living Expenses: Not very hard  Food Insecurity: No Food Insecurity (06/19/2024)   Hunger Vital Sign    Worried About Running Out of Food in the Last Year: Never true    Ran Out of Food in the Last Year: Never true  Transportation Needs: No Transportation Needs (06/19/2024)   PRAPARE - Administrator, Civil Service (Medical): No    Lack of Transportation (Non-Medical): No  Recent Concern: Transportation Needs - Unmet Transportation Needs (06/19/2024)   PRAPARE - Transportation    Lack of Transportation (Medical): Yes    Lack of Transportation (Non-Medical): Yes  Physical Activity: Inactive (06/19/2024)   Exercise Vital Sign    Days of Exercise per Week: 0 days    Minutes of Exercise per Session: 10 min  Stress: No Stress Concern Present (06/19/2024)   [employer] of Occupational [employer] - Occupational Stress Questionnaire    Feeling of Stress: Not at all  Recent Concern: Stress - Stress Concern Present (06/19/2024)   [employer] of Occupational [employer] - Occupational Stress Questionnaire    Feeling of Stress: Very much  Social Connections: Somewhat Isolated (11/24/2023)   Received from [facility]   Social Network    How would you rate your social network (family, work, friends)?: Restricted participation with some degree of social isolation  Intimate Partner Violence: Not At Risk (06/19/2024)   Humiliation, Afraid, Rape, and Kick questionnaire    Fear of Current or Ex-Partner: No    Emotionally Abused: No    Physically Abused: No    Sexually Abused: No       Objective:  There were no vitals taken for this visit. {Pulm Vitals (Optional):32837}  Physical Exam  Diagnostic Review:  {Labs (Optional):32838}    CT chest w contrast  10/2023 1. No acute findings. 2. 9 mm perifissural nodule abutting the right major fissure consistent with  an intrapulmonary lymph node. Per Fleischner Society Guidelines, no routine follow-up imaging is recommended. These guidelines do not apply to immunocompromised patients and patients with cancer. Follow up in patients with significant comorbidities as clinically warranted. For lung cancer screening, adhere to Lung-RADS guidelines  PFT  10/15/24   FEV1 2.48/-2.1 FVC    2/-1.6 F/F       82 TLC    4.4/-1.83 RV      0.43/-4.6 DLCO 18.3/85  10/15/24: No restriction, no obstruction, normal DLCO. No response to BD  Assessment & Plan:   Assessment & Plan  Reticular opacities on the bases but nothing significant PFT  No follow-ups on file.    Marny Patch, MD Pulmonary and Critical Care Medicine [facility]

## 2024-10-22 ENCOUNTER — Ambulatory Visit

## 2024-10-22 VITALS — BP 119/79 | HR 79 | Temp 97.8°F | Ht 66.5 in | Wt 202.4 lb

## 2024-10-22 DIAGNOSIS — R0600 Dyspnea, unspecified: Secondary | ICD-10-CM

## 2024-10-22 MED ORDER — FLUTICASONE-SALMETEROL 115-21 MCG/ACT IN AERO: 2.0000 | INHALATION_SPRAY | Freq: Two times a day (BID) | RESPIRATORY_TRACT | 4 refills | Status: DC

## 2024-10-22 MED ORDER — AEROCHAMBER MV MISC: 0 refills | Status: AC

## 2024-10-22 NOTE — Patient Instructions (Addendum)
 Dear Ms. Maul;   I will recommend the following: -Advair 2 puff twice a day for possible asthma -I will continue to monitor the small changes on your lung. I don't think you have significant scarring on the lungs, your pulmonary function test is overall normal. -I highly recommend to walking every day.  -Also, working on losing weight, it would be very beneficial for you.  -ILD questionnaire  -I will recommend to check your weight every day, to see if there is increasing of fluid.  -I will see you on 3 months  INHALER INSTRUCTIONS: To use the inhaler you follow these steps: Prime the inhaler according to instructions (which means waste between 1-4 doses before next use).  Follow package insert Shake the inhaler before each use Connect spacer Exhale completely by blowing all the air out of your lungs Seal your mouth around the spacer Press down on the canister then inhale SLOW and STEADY until your fill your lungs completely. Hold the breath for 6-10 seconds Gently exhale Wait 60 seconds then repeat steps 2-8. Rinse the mouth after each use to prevent thrush  Your pharmacist can also review proper technique if you have any remaining questions. Let me know if the cost is too high, your insurance may be able to recommend a more affordable option for you.

## 2024-10-24 ENCOUNTER — Ambulatory Visit: Payer: Self-pay | Admitting: *Deleted

## 2024-10-25 ENCOUNTER — Encounter: Payer: Self-pay | Admitting: Dermatology

## 2024-10-25 ENCOUNTER — Ambulatory Visit: Admitting: Dermatology

## 2024-10-25 VITALS — BP 135/81 | HR 75 | Temp 98.5°F

## 2024-10-25 DIAGNOSIS — L905 Scar conditions and fibrosis of skin: Secondary | ICD-10-CM

## 2024-10-25 DIAGNOSIS — Z85828 Personal history of other malignant neoplasm of skin: Secondary | ICD-10-CM

## 2024-10-25 DIAGNOSIS — T1490XD Injury, unspecified, subsequent encounter: Secondary | ICD-10-CM

## 2024-10-25 NOTE — Progress Notes (Signed)
   Follow Up Visit   Subjective  Natasha Wright is a 64 y.o. 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 The patient has been bandaging the wound as directed. 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  The following portions of the chart were reviewed this encounter and updated as appropriate: medications, allergies, medical history  Review of Systems:  No other skin or systemic complaints except as noted in HPI or Assessment and Plan.  Objective  Well appearing patient in no apparent distress; mood and affect are within normal limits.  A focal examination was performed including scalp, head, face and lower legs All findings within normal limits unless otherwise noted below.  Healing wound with mild erythema  Relevant physical exam findings are noted in the Assessment and Plan.       Assessment & Plan   Healing Wound s/p Mohs for SCCIS of the right pretibial region, treated on 09/10/2024, healing by second intention - Reassured that wound is healing well, continue ointment daily under bandage - Discussed that scars take up to 12 months to mature from the date of surgery - Recommend SPF 30+ to scar daily to prevent purple color - OK to start scar massage at 4-6 weeks post-op - Can consider silicone based products for scar healing - Continue daily mupirocin   Scar s/p Mohs for SCC on the left distal forearm, treated on 09/24/24, repaired with linear closure - Reassured that wound is healing well - No evidence of infection - No swelling, induration, purulence, dehiscence, or tenderness out of proportion to the clinical exam, see photo above - Discussed that scars take up to 12 months to mature  from the date of surgery - Recommend SPF 30+ to scar daily to prevent purple color from UV exposure during scar maturation process - Discussed that erythema and raised appearance of scar will fade over the next 4-6 months - OK to start scar massage at 4-6 weeks post-op - Can consider silicone based products for scar healing starting at 6 weeks post-op  HISTORY OF SQUAMOUS CELL CARCINOMA OF THE SKIN - No evidence of recurrence today - Recommend regular full body skin exams - Recommend daily broad spectrum sunscreen SPF 30+ to sun-exposed areas, reapply every 2 hours as needed.  - Call if any new or changing lesions are noted between office visits    Return in about 4 weeks (around 11/22/2024) for wound check.  Documentation: I have reviewed the above documentation for accuracy and completeness, and I agree with the above.  [name], MD

## 2024-10-26 ENCOUNTER — Other Ambulatory Visit (HOSPITAL_COMMUNITY): Payer: Self-pay

## 2024-10-26 MED ORDER — BUDESONIDE-FORMOTEROL FUMARATE 80-4.5 MCG/ACT IN AERO
2.0000 | INHALATION_SPRAY | Freq: Two times a day (BID) | RESPIRATORY_TRACT | 3 refills | Status: DC
  Filled 2024-10-26: qty 10.2, 30d supply, fill #0

## 2024-10-26 NOTE — Telephone Encounter (Signed)
 I received notification of pharmacy to change inhaler due to lack of coverage by insurance.  I switched from advair to symbicort  80, 2 puffs twice a day.

## 2024-10-29 NOTE — Telephone Encounter (Signed)
Please advise on new inhaler

## 2024-10-31 ENCOUNTER — Encounter: Payer: Self-pay | Admitting: Dermatology

## 2024-11-02 ENCOUNTER — Other Ambulatory Visit (HOSPITAL_COMMUNITY): Payer: Self-pay

## 2024-11-02 MED ORDER — BUDESONIDE-FORMOTEROL FUMARATE 80-4.5 MCG/ACT IN AERO: 2.0000 | INHALATION_SPRAY | Freq: Two times a day (BID) | RESPIRATORY_TRACT | 3 refills | Status: AC

## 2024-11-02 NOTE — Telephone Encounter (Signed)
 Copied from CRM #[id number]. Topic: Clinical - Prescription Issue >> Nov 01, 2024  4:29 PM Leila C wrote: Reason for CRM: Arlene from [employer] [phone] checking on a prescription requesting for budesonide -formoterol  (SYMBICORT ) 80-4.5 MCG/ACT inhaler, it was faxed to 10/23/24 or the 10/26/24. Arlene needs the office address and fax number, provided the correct fax number. Arlene will fax the request to the correct office today.    New rx sent to [facility]. Nfn

## 2024-11-07 ENCOUNTER — Other Ambulatory Visit (HOSPITAL_COMMUNITY): Payer: Self-pay

## 2024-11-12 ENCOUNTER — Ambulatory Visit: Admitting: Dermatology

## 2024-11-13 ENCOUNTER — Ambulatory Visit: Admitting: Obstetrics

## 2024-11-13 ENCOUNTER — Encounter: Payer: Self-pay | Admitting: Obstetrics

## 2024-11-13 VITALS — BP 104/60 | HR 81

## 2024-11-13 DIAGNOSIS — K5909 Other constipation: Secondary | ICD-10-CM | POA: Diagnosis not present

## 2024-11-13 DIAGNOSIS — N816 Rectocele: Secondary | ICD-10-CM | POA: Diagnosis not present

## 2024-11-13 DIAGNOSIS — Z87440 Personal history of urinary (tract) infections: Secondary | ICD-10-CM | POA: Diagnosis not present

## 2024-11-13 DIAGNOSIS — N941 Unspecified dyspareunia: Secondary | ICD-10-CM

## 2024-11-13 DIAGNOSIS — N3946 Mixed incontinence: Secondary | ICD-10-CM

## 2024-11-13 MED ORDER — TROSPIUM CHLORIDE 20 MG PO TABS: 20.0000 mg | ORAL_TABLET | Freq: Two times a day (BID) | ORAL | 2 refills | Status: AC

## 2024-11-13 NOTE — Assessment & Plan Note (Signed)
-   bilateral pelvic floor myofascial pain on prior exam with vaginal atrophy, h/o fibromyalgia, endometriosis, cervical spine surgery and scoliosis - pain exacerbated by prior pessary trial - The origin of pelvic floor muscle spasm can be multifactorial, including primary, reactive to a different pain source, trauma, or even part of a centralized pain syndrome.Treatment options include pelvic floor physical therapy, local (vaginal) or oral  muscle relaxants, pelvic muscle trigger point injections or centrally acting pain medications.   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

## 2024-11-13 NOTE — Assessment & Plan Note (Signed)
-   denies UTI symptoms today, reports chronic UTI symptoms resolved spontaneously in the past - 08/13/24 POCT negative - For treatment of recurrent urinary tract infections, we discussed management of recurrent UTIs including prophylaxis with a daily low dose antibiotic, transvaginal estrogen therapy, D-mannose, and cranberry supplements.  We discussed the role of diagnostic testing such as cystoscopy and upper tract imaging.   - continue low dose vaginal estrogen - return to the office for testing when she experiences UTI symptoms  - encouraged NSAIDs, Tylenol, and Cystex use PRN symptoms with treatment of GERD prior to dosing - For irritative bladder we reviewed treatment options including altering her diet to avoid irritative beverages and foods as well as attempting to decrease stress and other exacerbating factors.  We also discussed using pyridium and similar over-the-counter medications for pain relief as needed. We discussed the pentad of medications including Tums, an antihistamine such as Vistaril, amitriptyline, and L-arginine.  We also discussed in-office bladder instillations for pain flares, as well as cystoscopy with hydrodistention in the operating room, which can be both diagnostic and therapeutic. - encouraged bladder diary to identify triggers - encouraged stress management and reconsider therapy  - resend referral to pelvic floor PT  - encouraged to consider bladder instillation PRN flare of symptoms - encouraged to return for urine testing if clinical change

## 2024-11-13 NOTE — Progress Notes (Signed)
 [facility] Return Visit  SUBJECTIVE  History of Present Illness: Natasha Wright is a 63 y.o. female seen in follow-up for stage II pelvic organ prolapse, constipation, mixed urinary incontinence, abdominal pain, dyspareunia, abnormal urinalysis, and history of UTI. Plan at last visit was referral to pelvic floor PT, trial of Trospium , and low dose vaginal estrogen.   Sister-in-law was diagnosed with liver mass with breast cancer, who is her best friend.  Using vaginal estrogen 2x/week Reports sadness over weight gain and asthma diagnosis.  Reports UTI symptoms requiring 2 antibiotics since last visit, denies symptoms today. Denies starting Trospium  60mg  since it was not covered by insurance H/o trial of #4 ring with support use with exacerbation of pain, despite resolution of urgency urinary incontinence  Mirabegron, Gemtesa, and Trospium  not covered by insurance, continued Vesicare    Unable to undergo pelvic floor PT attributed to undergoing treatments for squamous cell CA, mother's illness, and sister-in-law's illness. Trying to do Kegel exercises with some relief, denies exacerbation of pain.  Interested in [place] chair  Continues to report double voiding, sensation of incomplete emptying.  Vaginal bulge golf ball size without bleeding or discharge.  Bowel movements fluctuates due to IBS, usually 1x/day. Denies miralax or fiber supplementation due to diarrhea with switching formulation and dosing of Colestipol  and acid reflux. Friend underwent surgery and did not like her postoperative course.  Leaks 10x/week with cough/sneeze reduced since BM 1x/day Leaks with urgency 2-3x/week reduced from 1 x/day on Vesicare  at night with reduction of night time frequency Pad use: 2 liners/ mini-pads per day.   Drinks: 32oz water  per day, 16oz tea, 1 cups of coffee (reduced due to rapid heartbeat), 12oz soda/day  Stopped oral HRT, using vaginal estrogen 2x/week  Pain 2/10 compared  to 3/10 pelvic pain since 63yo   Past Medical History: Patient  has a past medical history of Anxiety, Arthritis, B12 deficiency, Carcinoma (HCC), Family history of heart disease, GERD (gastroesophageal reflux disease), Hypoglycemia, IBS (irritable bowel syndrome), Lumbar disc disease, OA (osteoarthritis), PONV (postoperative nausea and vomiting), Rectocele, Shingles, Skin cancer, and Spinal stenosis in cervical region.   Past Surgical History: She  has a past surgical history that includes Colonoscopy (07/08/2003); Total laparoscopic hysterectomy with bilateral salpingo oophorectomy (1992); Cervical spine surgery (2009); Colonoscopy (N/A, 04/30/2013); Tonsillectomy (1980); NM MYOCAR PERF WALL MOTION (10/2011); Cholecystectomy (2004); Dilation and curettage of uterus; Tubal ligation; Arthrodesis foot with weil osteotomy (Left, 05/09/2014); Bunionectomy with hammertoe reconstruction (Left, 05/09/2014); Esophagogastroduodenoscopy (egd) with propofol  (N/A, 07/11/2018); Savory dilation (N/A, 07/11/2018); biopsy (07/11/2018); Shoulder surgery; Knee arthroscopy (Right); Nasal sinus surgery; right ankle surgery; Colonoscopy with propofol  (N/A, 07/04/2023); polypectomy (07/04/2023); biopsy (07/04/2023); and Laparoscopic hysterectomy (1992).   Medications: She has a current medication list which includes the following prescription(s): acetaminophen, atenolol , azelastine, budesonide , budesonide -formoterol , citalopram , colestipol , dicyclomine, estradiol , flecainide , fluticasone , furosemide , ipratropium, mirtazapine, multivitamin with minerals, mupirocin  ointment, pantoprazole , rosuvastatin, aerochamber mv, spironolactone , tacrolimus, triamcinolone  ointment, and trospium .   Allergies: Patient is allergic to penicillins, sulfa antibiotics, sulfamethoxazole-trimethoprim, aspirin , codeine, fentanyl , ibuprofen, morphine, adhesive [tape], influenza vaccines, latex, nsaids, and other.   Social History: Patient  reports  that she quit smoking about 10 years ago. Her smoking use included cigarettes. She started smoking about 50 years ago. She has a 20 pack-year smoking history. She has never used smokeless tobacco. She reports that she does not drink alcohol and does not use drugs.     OBJECTIVE     Physical Exam: Vitals:   11/13/24 0943  BP: 104/60  Pulse: 81   Gen: No apparent distress, A&O x 3.  Detailed Urogynecologic Evaluation:  Deferred.     ASSESSMENT AND PLAN    [name] is a 63 y.o. with:  1. Pelvic organ prolapse quantification stage 2 rectocele   2. Mixed stress and urge urinary incontinence   3. Other constipation   4. Dyspareunia, female   5. History of recurrent UTI (urinary tract infection)     Pelvic organ prolapse quantification stage 2 rectocele Assessment & Plan: - straining with chronic IBS - trial of #4 ring with support use with exacerbation of pain, despite resolution of urgency urinary incontinence per chart review - For treatment of pelvic organ prolapse, we discussed options for management including expectant management, conservative management, and surgical management, such as Kegels, a pessary, pelvic floor physical therapy, and specific surgical procedures. - repeat referral to pelvic floor PT due to pelvic floor myofascial pain and encouraged to schedule appt  - encouraged to optimize stool consistency   Mixed stress and urge urinary incontinence Assessment & Plan: - POCT UA negative, PVR 24mL - ditropan caused indigestion  - SUI > OAB since Vesicare  use with reduction of night time frequency - prior pessary use with resolution of urgency urinary leakage per documentation with exacerbation of pain - pt reports use of pessary postpartum for urinary and fecal incontinence for 6 months - For treatment of stress urinary incontinence,  non-surgical options include expectant management, weight loss, physical therapy, as well as a pessary.  Surgical options include  a midurethral sling, Burch urethropexy, and transurethral injection of a bulking agent. - resent referral to pelvic floor PT - discussed risk of worsening pelvic floor tension with Emsella chair and cost - continue low dose vaginal estrogen - We discussed the symptoms of overactive bladder (OAB), which include urinary urgency, urinary frequency, nocturia, with or without urge incontinence.  While we do not know the exact etiology of OAB, several treatment options exist. We discussed management including behavioral therapy (decreasing bladder irritants, urge suppression strategies, timed voids, bladder retraining), physical therapy, medication; for refractory cases posterior tibial nerve stimulation, sacral neuromodulation, and intravesical botulinum toxin injection.  For anticholinergic medications, we discussed the potential side effects of anticholinergics including dry eyes, dry mouth, constipation, cognitive impairment and urinary retention. For Beta-3 agonist medication, we discussed the potential side effect of elevated blood pressure which is more likely to occur in individuals with uncontrolled hypertension. - mirabegron and Gemtesa not covered by insurance, discontinue Vesicare  and start trial of Trospium  20mg  BID. Encouraged to contact office if cost prohibitive, can send Rx to [facility] - discussed fluid management and continue caffeine reduction due to improvement of symptoms in addition to increased frequency of bowel movements - encouraged weight reduction  Orders: -     Trospium  Chloride; Take 1 tablet (20 mg total) by mouth 2 (two) times daily.  Dispense: 60 tablet; Refill: 2 -     AMB referral to rehabilitation  Other constipation Assessment & Plan: - IBS symptoms since 63yo - s/p cholecystectomy on colestipol , improved with adjustment of medication - prior use of Linzess - For constipation, we reviewed the importance of a better bowel regimen.  We also discussed the importance of  avoiding chronic straining, as it can exacerbate her pelvic floor symptoms; we discussed treating constipation and straining prior to surgery, as postoperative straining can lead to damage to the repair and recurrence of symptoms. We discussed initiating therapy with increasing fluid intake, fiber supplementation, stool softeners,  and laxatives such as miralax.  - encouraged titration of fiber supplementation or miralax use to reduce straining if symptoms return - advised squatting position for bowel movements and splinting to reduce straining - encouraged to follow-up with GI - discontinue Ditropan 5mg  BID due to risk of constipation exacerbation, monitor for worsening symptoms with Rx Trospium  20mg  BID. Discussed need to switch to 60mg  daily if she experiences worsening GI symptoms. 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   - repeat referral to pelvic floor PT and encouraged weight reduction - continue vaginal estrogen due to atrophy - encouraged therapy due to childhood trauma and PTSD from son's ICU admission due to association with pelvic floor disorders, pt desires to seek out spiritual support from religious personnel    History of recurrent UTI (urinary tract  infection) Assessment & Plan: - denies UTI symptoms today, reports chronic UTI symptoms resolved spontaneously in the past - 08/13/24 POCT negative - For treatment of recurrent urinary tract infections, we discussed management of recurrent UTIs including prophylaxis with a daily low dose antibiotic, transvaginal estrogen therapy, D-mannose, and cranberry supplements.  We discussed the role of diagnostic testing such as cystoscopy and upper tract imaging.   - continue low dose vaginal estrogen - return to the office for testing when she experiences UTI symptoms  - encouraged NSAIDs, Tylenol, and Cystex use PRN symptoms with treatment of GERD prior to dosing - For irritative bladder we reviewed treatment options including altering her diet to avoid irritative beverages and foods as well as attempting to decrease stress and other exacerbating factors.  We also discussed using pyridium and similar over-the-counter medications for pain relief as needed. We discussed the pentad of medications including Tums, an antihistamine such as Vistaril, amitriptyline, and L-arginine.  We also discussed in-office bladder instillations for pain flares, as well as cystoscopy with hydrodistention in the operating room, which can be both diagnostic and therapeutic. - encouraged bladder diary to identify triggers - encouraged stress management and reconsider therapy  - resend referral to pelvic floor PT  - encouraged to consider bladder instillation PRN flare of symptoms - encouraged to return for urine testing if clinical change    Time spent: I spent 35 minutes dedicated to the care of this patient on the date of this encounter to include pre-visit review of records, face-to-face time with the patient discussing stage II pelvic organ prolapse, constipation, mixed urinary incontinence, dyspareunia, and history of UTI. and post visit documentation and ordering medication/ testing.   Lianne ONEIDA Gillis, MD

## 2024-11-13 NOTE — Patient Instructions (Addendum)
 Please review side effects of anticholinergic medications and the potential side effects of anticholinergics including dry eyes, dry mouth, constipation, cognitive impairment and urinary retention. Stop medication and seek care immediately if she experiences visual changes or inability to void  Started Trospium  20mg  twice a day and stop Vesicare   If not covered by insurance or you experience side effects.  Please visit the website below to sign up for an account. We will need an email address to send along with your prescription to verify your prescription once you have signed up.  [email or web address]  Trospium  Chloride ER Capsule Extended Release  60mg   30 count $31.11  Or    Trospium  Chloride (2 times a day dosing) Tablet  20mg   60 count  $14.03  Continue vaginal estrogen 1g twice a week. Consider increasing to 3x/week.   - discussed proper vulvar care, warm compression, avoid pad use, cotton only underwear and barrier ointment if needed  - encouraged Vit E suppository, moisturizer with Replens/Revaree  Please call [phone] to schedule the earliest appointment for pelvic floor PT.  Continue to work on diet modification and weight reduction

## 2024-11-13 NOTE — Assessment & Plan Note (Signed)
-   IBS symptoms since 63yo - s/p cholecystectomy on colestipol , improved with adjustment of medication - prior use of Linzess - For constipation, we reviewed the importance of a better bowel regimen.  We also discussed the importance of avoiding chronic straining, as it can exacerbate her pelvic floor symptoms; we discussed treating constipation and straining prior to surgery, as postoperative straining can lead to damage to the repair and recurrence of symptoms. We discussed initiating therapy with increasing fluid intake, fiber supplementation, stool softeners, and laxatives such as miralax.  - encouraged titration of fiber supplementation or miralax use to reduce straining if symptoms return - advised squatting position for bowel movements and splinting to reduce straining - encouraged to follow-up with GI - discontinue Ditropan 5mg  BID due to risk of constipation exacerbation, monitor for worsening symptoms with Rx Trospium  20mg  BID. Discussed need to switch to 60mg  daily if she experiences worsening GI symptoms. - repeat referral to pelvic floor PT due to high tone pelvic floor and pelvic floor myofascial pain - urinary symptoms improve with increased frequency of bowel movements, continue reduction of caffeine

## 2024-11-13 NOTE — Assessment & Plan Note (Signed)
-   straining with chronic IBS - trial of #4 ring with support use with exacerbation of pain, despite resolution of urgency urinary incontinence per chart review - For treatment of pelvic organ prolapse, we discussed options for management including expectant management, conservative management, and surgical management, such as Kegels, a pessary, pelvic floor physical therapy, and specific surgical procedures. - repeat referral to pelvic floor PT due to pelvic floor myofascial pain and encouraged to schedule appt  - encouraged to optimize stool consistency

## 2024-11-13 NOTE — Assessment & Plan Note (Addendum)
-   POCT UA negative, PVR 24mL - ditropan caused indigestion  - SUI > OAB since Vesicare  use with reduction of night time frequency - prior pessary use with resolution of urgency urinary leakage per documentation with exacerbation of pain - pt reports use of pessary postpartum for urinary and fecal incontinence for 6 months - For treatment of stress urinary incontinence,  non-surgical options include expectant management, weight loss, physical therapy, as well as a pessary.  Surgical options include a midurethral sling, Burch urethropexy, and transurethral injection of a bulking agent. - resent referral to pelvic floor PT - discussed risk of worsening pelvic floor tension with Emsella chair and cost - continue low dose vaginal estrogen - We discussed the symptoms of overactive bladder (OAB), which include urinary urgency, urinary frequency, nocturia, with or without urge incontinence.  While we do not know the exact etiology of OAB, several treatment options exist. We discussed management including behavioral therapy (decreasing bladder irritants, urge suppression strategies, timed voids, bladder retraining), physical therapy, medication; for refractory cases posterior tibial nerve stimulation, sacral neuromodulation, and intravesical botulinum toxin injection.  For anticholinergic medications, we discussed the potential side effects of anticholinergics including dry eyes, dry mouth, constipation, cognitive impairment and urinary retention. For Beta-3 agonist medication, we discussed the potential side effect of elevated blood pressure which is more likely to occur in individuals with uncontrolled hypertension. - mirabegron and Gemtesa not covered by insurance, discontinue Vesicare  and start trial of Trospium  20mg  BID. Encouraged to contact office if cost prohibitive, can send Rx to [facility] - discussed fluid management and continue caffeine reduction due to improvement of symptoms in addition to  increased frequency of bowel movements - encouraged weight reduction

## 2024-11-26 DIAGNOSIS — R1011 Right upper quadrant pain: Secondary | ICD-10-CM

## 2024-11-27 ENCOUNTER — Encounter (HOSPITAL_COMMUNITY): Payer: Self-pay

## 2024-11-27 ENCOUNTER — Ambulatory Visit (HOSPITAL_COMMUNITY)
Admission: RE | Admit: 2024-11-27 | Discharge: 2024-11-27 | Disposition: A | Discharge status: Home / Self Care | Payer: Medicare (Managed Care) | Source: Ambulatory Visit

## 2024-11-27 DIAGNOSIS — R1011 Right upper quadrant pain: Secondary | ICD-10-CM | POA: Insufficient documentation

## 2024-12-03 ENCOUNTER — Encounter: Payer: Self-pay | Admitting: *Deleted

## 2024-12-10 ENCOUNTER — Encounter: Payer: Self-pay | Admitting: Gastroenterology

## 2024-12-10 ENCOUNTER — Ambulatory Visit: Payer: Medicare (Managed Care) | Admitting: Gastroenterology

## 2024-12-10 VITALS — BP 112/71 | HR 94 | Temp 98.3°F | Ht 68.0 in | Wt 203.4 lb

## 2024-12-10 DIAGNOSIS — R1310 Dysphagia, unspecified: Secondary | ICD-10-CM | POA: Diagnosis not present

## 2024-12-10 DIAGNOSIS — K760 Fatty (change of) liver, not elsewhere classified: Secondary | ICD-10-CM | POA: Diagnosis not present

## 2024-12-10 DIAGNOSIS — K219 Gastro-esophageal reflux disease without esophagitis: Secondary | ICD-10-CM | POA: Diagnosis not present

## 2024-12-10 DIAGNOSIS — R197 Diarrhea, unspecified: Secondary | ICD-10-CM

## 2024-12-10 DIAGNOSIS — K529 Noninfective gastroenteritis and colitis, unspecified: Secondary | ICD-10-CM

## 2024-12-10 DIAGNOSIS — R1011 Right upper quadrant pain: Secondary | ICD-10-CM | POA: Insufficient documentation

## 2024-12-10 MED ORDER — CHOLESTYRAMINE 4 G PO PACK: 4.0000 g | PACK | Freq: Every day | ORAL | 12 refills | Status: AC

## 2024-12-10 NOTE — Patient Instructions (Signed)
 Stop colestid . Start cholestyramine  4 grams daily. Do not take within 2 hours of other medications.  Complete labs at [facility] on [address] [place].  We will get you scheduled for upper endoscopy in the near future.

## 2024-12-10 NOTE — Progress Notes (Signed)
 "    GI Office Note    Referring Provider: Shona Norleen PEDLAR, MD Primary Care Physician:  Shona Norleen PEDLAR, MD  Primary Gastroenterologist: Carlin POUR. Cindie, DO   Chief Complaint   Chief Complaint  Patient presents with   abnormal stools    States stools were grey and she was spitting up yellow bile    History of Present Illness   Natasha Wright is a 64 y.o. female presenting today for abnormal stools and spitting up yellow bile. Last seen in 06/2024. She has  history of abdominal pain, diarrhea, bloating, gas, IBS, diverticulitis.    Discussed the use of AI scribe software for clinical note transcription with the patient, who gave verbal consent to proceed.  History of Present Illness   Around Christmas she developed recurrent indigestion with vomiting of bright yellow material and diarrhea. Vomiting is often postprandial, worse with greasy foods, and associated with reflux and heartburn. She intermittently spits up yellow material when lying down, bending over, or after eating.  On Christmas Eve she had severe indigestion with vomiting followed by several episodes of gray, chemical-smelling diarrhea and significant right-sided abdominal pain localized to the right anterior abdomen, sometimes worsened by eating or bending. Since then she reports intermittent abdominal bloating and a sense of increased fluid in her stomach. Continues with intermittent ruq pain postprandially. Sometimes has fluid in her legs at the end of the day.  Bowel habits fluctuate between 2-3 days without a bowel movement and subsequent loose, muddy stools of varying color, including yellow and red mud. She no longer has persistent gray stools. She describes occasional episodes where food seems to pass rapidly after meals. Despite avoiding greasy foods and making dietary changes, she has difficulty losing weight. She also notes sulfur-smelling burping and intermittent bloating.   Current medications include  pantoprazole  twice daily and famotidine  as needed, which she may take up to 10 tablets per day during severe episodes. She would like to switch colestid  back to cholesytramine finding it more effective and easier to swallow. She has difficulty swallowing pills related to neck surgery with hardware, sometimes crushing medications. Pills can get stuck in her throat. She has intermittent dysphagia solid food dysphagia, sensation of food sitting in her chest.   She feels ongoing exhaustion and high caregiving-related stress and emotional distress, which she believes worsen her gastrointestinal symptoms.  Her best friend and sister-in-law is currently on Hospice care after recent diagnosis of suspected recurrent stage IV breast cancer.   Prior Data     Results    10/08/2024: TSH 2.26, free T4 0.98, white blood cell count 5.7, hemoglobin 13.7, platelets 254, glucose 99, creatinine 0.96, albumin 4.2, total bilirubin 0.4, alk phos 93, AST 19, ALT 15, A1c 5.7  Abd u/s 11/27/2024: IMPRESSION: 1. Cholecystectomy. No intrahepatic or extrahepatic biliary ductal dilation. 2. Mild hepatic steatosis. .   Colonoscopy 06/2023: -diverticulosis -one 8mm polyp in ascending colon removed, tubular adenomas -one 5mm polyp in rectum removed, hyperplastic -random colon biopsies negative -next colonoscopy five years   EGD 06/2018: -esophagus normal s/p dilation, patient responded to dilation -mild gastritis, no h.pylori -duodenal bx negative.   Colonoscopy 04/2013: -mild diverticulosis -small internal hemorrhoids   Medications   Current Outpatient Medications  Medication Sig Dispense Refill   acetaminophen (TYLENOL) 650 MG CR tablet Take 1,300 mg by mouth 2 (two) times daily as needed for pain.     atenolol  (TENORMIN ) 25 MG tablet TAKE 1 TABLET BY MOUTH DAILY 100 tablet  2   azelastine (ASTELIN) 0.1 % nasal spray as needed for allergies.     budesonide  (RHINOCORT  AQUA) 32 MCG/ACT nasal spray as needed for  allergies.     budesonide -formoterol  (SYMBICORT ) 80-4.5 MCG/ACT inhaler Inhale 2 puffs into the lungs 2 (two) times daily. 10.2 g 3   citalopram  (CELEXA ) 20 MG tablet Take 1 tablet (20 mg total) by mouth 2 (two) times daily. 180 tablet 3   colestipol  (COLESTID ) 1 g tablet Take 1 tablet (1 g total) by mouth 2 (two) times daily. Do not take within 2 hours of other medications 180 tablet 3   dicyclomine (BENTYL) 20 MG tablet Take 20 mg by mouth 4 (four) times daily as needed for spasms. Rarely takes.     estradiol  (ESTRACE ) 0.1 MG/GM vaginal cream Place 0.5g at night twice a week 30 g 3   flecainide  (TAMBOCOR ) 50 MG tablet Take 1 tablet (50 mg total) by mouth 2 (two) times daily. 180 tablet 3   fluticasone  (FLONASE) 50 MCG/ACT nasal spray Place 1 spray into both nostrils 2 (two) times daily.     furosemide  (LASIX ) 20 MG tablet Take 20 mg by mouth daily.     ipratropium (ATROVENT) 0.06 % nasal spray Place 2 sprays into both nostrils daily.     mirtazapine (REMERON) 15 MG tablet Take 15 mg by mouth at bedtime.     Multiple Vitamin (MULTIVITAMIN WITH MINERALS) TABS tablet Take 1 tablet by mouth daily.     mupirocin  ointment (BACTROBAN ) 2 % Apply 1 Application topically 2 (two) times daily. Apply liberally twice daily during bandage change 22 g 0   pantoprazole  (PROTONIX ) 40 MG tablet TAKE 1 TABLET BY MOUTH 30 MINUTES PRIOR TO MEALS TWICE DAILY 60 tablet 11   rosuvastatin (CRESTOR) 10 MG tablet Take 10 mg by mouth at bedtime.     Spacer/Aero-Holding Chambers (AEROCHAMBER MV) inhaler Use as instructed 1 each 0   spironolactone  (ALDACTONE ) 25 MG tablet TAKE 1 TABLET BY MOUTH DAILY 100 tablet 3   tacrolimus (PROTOPIC) 0.1 % ointment Apply topically 2 (two) times daily.     triamcinolone  ointment (KENALOG ) 0.1 % Apply 1 Application topically 2 (two) times daily.     trospium  (SANCTURA ) 20 MG tablet Take 1 tablet (20 mg total) by mouth 2 (two) times daily. 60 tablet 2   No current facility-administered  medications for this visit.    Allergies   Allergies as of 12/10/2024 - Review Complete 11/13/2024  Allergen Reaction Noted   Penicillins Shortness Of Breath and Rash    Sulfa antibiotics Hives, Rash, Shortness Of Breath, and Swelling 08/10/2017   Sulfamethoxazole-trimethoprim Shortness Of Breath and Rash    Aspirin   12/12/2017   Codeine Nausea Only and Other (See Comments)    Fentanyl  Nausea Only and Other (See Comments)    Ibuprofen  08/10/2017   Morphine Nausea Only and Other (See Comments)    Adhesive [tape] Other (See Comments) 06/29/2018   Influenza vaccines Rash 03/06/2014   Latex Rash 06/29/2018   Nsaids Nausea Only 04/23/2013   Other Nausea Only 12/15/2017     Past Medical History   Past Medical History:  Diagnosis Date   Anxiety    Arthritis    B12 deficiency    h/o   Carcinoma (HCC)    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, 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   Family history of heart disease    GERD (gastroesophageal reflux disease)    Hypoglycemia    IBS (irritable bowel syndrome)  Lumbar disc disease    OA (osteoarthritis)    PONV (postoperative nausea and vomiting)    needs scop patch   Rectocele    Shingles    Skin cancer    Spinal stenosis in cervical region     Past Surgical History   Past Surgical History:  Procedure Laterality Date   ARTHRODESIS FOOT WITH WEIL OSTEOTOMY Left 05/09/2014   Procedure: LEFT FIRST TARSAL METATARSAL  ARTHRODESIS; LEFT SECOND WEIL OSTEOTOMY;  Surgeon: Norleen Armor, MD;  Location: [facility];  Service: Orthopedics;  Laterality: Left;   BIOPSY  07/11/2018   Procedure: BIOPSY;  Surgeon: Harvey Margo CROME, MD;  [facility];  Service: Endoscopy;;  duodenal biopsy , gastric biopsy    BIOPSY  07/04/2023   Procedure: BIOPSY;  Surgeon: Cindie Carlin POUR, DO;  Location: [facility];  Service: Endoscopy;;   BUNIONECTOMY WITH HAMMERTOE RECONSTRUCTION Left 05/09/2014   Procedure: LEFT MODIFIED MCBRIDE BUNIONECTOMY; LEFT  SECOND HAMMER TOE CORRECTION;  Surgeon: Norleen Armor, MD;  Location: [facility];  Service: Orthopedics;  Laterality: Left;   CERVICAL SPINE SURGERY  2009   CHOLECYSTECTOMY  2004   lap choli   COLONOSCOPY  07/08/2003   RMR: Left-sided diverticula, remainder of colonic mucosa and terminal ileum appeared normal.. Minimally friable internal hemorrhoids, otherwise normal rectum   COLONOSCOPY N/A 04/30/2013   Dr. Harvey: mild diverticulosis in descending and sigmoid colon, internal hemorrhoids.    COLONOSCOPY WITH PROPOFOL  N/A 07/04/2023   Procedure: COLONOSCOPY WITH PROPOFOL ;  Surgeon: Cindie Carlin POUR, DO;  Location: [facility];  Service: Endoscopy;  Laterality: N/A;  10:45 AM, ASA 3, pt knows to arrive at 8:00   DILATION AND CURETTAGE OF UTERUS     ESOPHAGOGASTRODUODENOSCOPY (EGD) WITH PROPOFOL  N/A 07/11/2018   Procedure: ESOPHAGOGASTRODUODENOSCOPY (EGD) WITH PROPOFOL ;  Surgeon: Harvey Margo CROME, MD;  Location: [facility];  Service: Endoscopy;  Laterality: N/A;  9:30am   KNEE ARTHROSCOPY Right    LAPAROSCOPIC HYSTERECTOMY  1992   NASAL SINUS SURGERY     NM MYOCAR PERF WALL MOTION  10/2011   bruce myoview  - breast attenuation noted in anterior region; EF 65%; no ischemia/infarct/scar; low risk   POLYPECTOMY  07/04/2023   Procedure: POLYPECTOMY;  Surgeon: Cindie Carlin POUR, DO;  Location: [facility];  Service: Endoscopy;;   right ankle surgery     SAVORY DILATION N/A 07/11/2018   Procedure: SAVORY DILATION;  Surgeon: Harvey Margo CROME, MD;  Location: [facility];  Service: Endoscopy;  Laterality: N/A;   SHOULDER SURGERY     TONSILLECTOMY  1980   TOTAL LAPAROSCOPIC HYSTERECTOMY WITH BILATERAL SALPINGO OOPHORECTOMY  1992   TUBAL LIGATION      Past Family History   Family History  Problem Relation Age of Onset   Heart disease Paternal Grandfather    Stroke Paternal Grandmother    Diabetes Paternal Grandmother    Heart disease Paternal Grandmother    Breast cancer  Maternal Grandmother    Hypertension Maternal Grandmother    Diabetes Maternal Grandmother    Stroke Maternal Grandmother    Asthma Maternal Grandmother    Stroke Maternal Grandfather    Ulcerative colitis Father    Diabetes Mother    Hypertension Mother    Hyperlipidemia Mother    Alzheimer's disease Mother    Hyperlipidemia Brother    Hyperlipidemia Sister    Hypertension Sister    Diabetes Sister    Down syndrome Child    Heart  disease Child    Colon cancer Neg Hx     Past Social History   Social History   Socioeconomic History   Marital status: Married    Spouse name: Jayson   Number of children: 1   Years of education: 14   Highest education level: Not on file  Occupational History   Occupation: Unemployed    Employer: [employer]    Employer: UNEMPLOYED  Tobacco Use   Smoking status: Former    Current packs/day: 0.00    Average packs/day: 0.5 packs/day for 40.0 years (20.0 ttl pk-yrs)    Types: Cigarettes    Start date: 12/24/1973    Quit date: 12/24/2013    Years since quitting: 10.9   Smokeless tobacco: Never   Tobacco comments:    using vape as aid to sustain from smoking    PATient no longer using Vape 10/22/2024 (as)  Vaping Use   Vaping status: Never Used  Substance and Sexual Activity   Alcohol use: No   Drug use: No   Sexual activity: Not Currently    Birth control/protection: None, Surgical    Comment: hysy  Other Topics Concern   Not on file  Social History Narrative   One son, Down syndrome,is right handed ,resides with son and husband   Right handed   Social Drivers of Health   Tobacco Use: Medium Risk (12/10/2024)   Patient History    Smoking Tobacco Use: Former    Smokeless Tobacco Use: Never    Passive Exposure: Not on Actuary Strain: Low Risk (06/19/2024)   Overall Financial Resource Strain (CARDIA)    Difficulty of Paying Living Expenses: Not very hard  Food Insecurity: No Food Insecurity (06/19/2024)    Epic    Worried About Radiation Protection Practitioner of Food in the Last Year: Never true    Ran Out of Food in the Last Year: Never true  Transportation Needs: No Transportation Needs (06/19/2024)   Epic    Lack of Transportation (Medical): No    Lack of Transportation (Non-Medical): No  Recent Concern: Transportation Needs - Unmet Transportation Needs (06/19/2024)   Epic    Lack of Transportation (Medical): Yes    Lack of Transportation (Non-Medical): Yes  Physical Activity: Inactive (06/19/2024)   Exercise Vital Sign    Days of Exercise per Week: 0 days    Minutes of Exercise per Session: 10 min  Stress: No Stress Concern Present (06/19/2024)   [employer] of Occupational [employer] - Occupational Stress Questionnaire    Feeling of Stress: Not at all  Recent Concern: Stress - Stress Concern Present (06/19/2024)   [employer] of Occupational Health - Occupational Stress Questionnaire    Feeling of Stress: Very much  Social Connections: Somewhat Isolated (11/24/2023)   [facility]   Social Network    How would you rate your social network (family, work, friends)?: Restricted participation with some degree of social isolation  Intimate Partner Violence: Not At Risk (06/19/2024)   Epic    Fear of Current or Ex-Partner: No    Emotionally Abused: No    Physically Abused: No    Sexually Abused: No  Depression (PHQ2-9): Not on file  Alcohol Screen: Low Risk (06/19/2024)   Alcohol Screen    Last Alcohol Screening Score (AUDIT): 0  Housing: Low Risk (06/19/2024)   Epic    Unable to Pay for Housing in the Last Year: No    Number of Times Moved in the  Last Year: 0    Homeless in the Last Year: No  Utilities: Not At Risk (06/19/2024)   Epic    Threatened with loss of utilities: No  Health Literacy: Adequate Health Literacy (05/21/2024)   B1300 Health Literacy    Frequency of need for help with medical instructions: Never    Review of Systems   General: Negative for anorexia, weight  loss, fever, chills, fatigue, weakness. ENT: Negative for hoarseness, difficulty swallowing, nasal congestion.see hpi CV: Negative for chest pain, angina, palpitations, dyspnea on exertion, peripheral edema.  Respiratory: Negative for dyspnea at rest, dyspnea on exertion, cough, sputum, wheezing.  GI: See history of present illness. GU:  Negative for dysuria, hematuria, urinary incontinence, urinary frequency, nocturnal urination.  Endo: Negative for unusual weight change.     Physical Exam   BP 112/71   Pulse 94   Temp 98.3 F (36.8 C) (Oral)   Ht 5' 8 (1.727 m)   Wt 203 lb 6.4 oz (92.3 kg)   SpO2 98%   BMI 30.93 kg/m    General: Well-nourished, well-developed in no acute distress.  Eyes: No icterus. Mouth: Oropharyngeal mucosa moist and pink   Lungs: Clear to auscultation bilaterally.  Heart: Regular rate and rhythm, no murmurs rubs or gallops.  Abdomen: Bowel sounds are normal, nontender, nondistended, no hepatosplenomegaly or masses,  no abdominal bruits or hernia , no rebound or guarding. No cva tenderness Rectal: not performed Extremities: No lower extremity edema. No clubbing or deformities. Neuro: Alert and oriented x 4   Skin: Warm and dry, no jaundice.   Psych: Alert and cooperative, normal mood and affect.  Labs   See above  Imaging Studies   US  Abdomen Complete Result Date: 11/27/2024 EXAM: COMPLETE ABDOMINAL ULTRASOUND TECHNIQUE: Real-time ultrasonography of the abdomen was performed. COMPARISON: 12/07/2021 CLINICAL HISTORY: RUQ PAIN. FINDINGS: LIVER: Increased hepatic echotexture. No intrahepatic biliary ductal dilatation. No mass. BILIARY SYSTEM: Cholecystectomy. Common bile duct is within normal limits measuring 4.5 mm. KIDNEYS: Right kidney measures 8.9 cm. Left kidney measures 9.1 cm. Normal contour of kidneys. Normal cortical echogenicity. No hydronephrosis. No calculus. No mass. PANCREAS: Visualized portions of the pancreas are unremarkable. SPLEEN: Spleen  length measures 9.9 cm. No other abnormality. VESSELS: Visualized portion of the aorta is normal. Visualized portion of the inferior vena cava is normal. Hepatopetal flow in the portal vein. OTHER: No ascites. IMPRESSION: 1. Cholecystectomy. No intrahepatic or extrahepatic biliary ductal dilation. 2. Mild hepatic steatosis. . 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 Overall well controlled. She desires going back to powder as it seemed to control diarrhea better.  - Switched to cholestyramine  powder, 4g daily, not to take within two hours of other medications.   Gastroesophageal reflux disease with dysphagia Longstanding GERD with worsening symptoms with heartburn, regurgitation, and dysphagia, ready for EGD/ED. - EGD/ED with Dr. Cindie. ASA 2.  I have discussed the risks, alternatives, benefits with regards to but not limited to the risk of reaction to medication, bleeding, infection, perforation and the patient is agreeable to proceed. Written consent to be obtained. - Continue pantoprazole  40 mg twice daily. - Continue famotidine  as needed, no more than package label. - Reinforced anti-reflux measures.  RUQ pain: Rule out biliary etiology. Symptoms could be secondary to gastritis/PUD/refractory GERD.  - CBC, CMET, Lipase - EGD as planned. If EGD negative and persistent symptoms, would consider  CT A/P with contrast.    Hepatic steatosis Mild hepatic steatosis noted on u/s.  -check LFTs, CBC for FIB4 score -continue low fat/low carb diet -Recommend 1-2# weight loss per week until ideal body weight through exercise & diet. -Avoid sweets, sodas, fruit juices, sweetened beverages like tea, etc. -Gradually increase exercise from 15 min daily up to 1 hr per day 5 days/week. -Limit alcohol use.       Sonny RAMAN. Ezzard, MHS,  PA-C [facility]  "

## 2024-12-11 ENCOUNTER — Encounter: Payer: Self-pay | Admitting: *Deleted

## 2024-12-11 ENCOUNTER — Telehealth: Payer: Self-pay | Admitting: *Deleted

## 2024-12-11 NOTE — Telephone Encounter (Signed)
 Pt has been scheduled for 12/26/24. Instructions sent via mychart.

## 2024-12-11 NOTE — Telephone Encounter (Signed)
 LMOVM to call back to schedule EGD/ED with Dr. Cindie, rm 1 ok

## 2024-12-12 ENCOUNTER — Ambulatory Visit: Admitting: Gastroenterology

## 2024-12-14 LAB — COMPREHENSIVE METABOLIC PANEL WITH GFR
ALT: 22 IU/L (ref 0–32)
AST: 27 IU/L (ref 0–40)
Albumin: 4.6 g/dL (ref 3.9–4.9)
Alkaline Phosphatase: 109 IU/L (ref 49–135)
BUN/Creatinine Ratio: 13 (ref 12–28)
BUN: 13 mg/dL (ref 8–27)
Bilirubin Total: 0.3 mg/dL (ref 0.0–1.2)
CO2: 20 mmol/L (ref 20–29)
Calcium: 9.1 mg/dL (ref 8.7–10.3)
Chloride: 107 mmol/L — ABNORMAL HIGH (ref 96–106)
Creatinine, Ser: 1.02 mg/dL — ABNORMAL HIGH (ref 0.57–1.00)
Globulin, Total: 2.6 g/dL (ref 1.5–4.5)
Glucose: 94 mg/dL (ref 70–99)
Potassium: 5 mmol/L (ref 3.5–5.2)
Sodium: 143 mmol/L (ref 134–144)
Total Protein: 7.2 g/dL (ref 6.0–8.5)
eGFR: 61 mL/min/1.73

## 2024-12-14 LAB — CBC WITH DIFFERENTIAL/PLATELET
Basophils Absolute: 0.1 x10E3/uL (ref 0.0–0.2)
Basos: 1 %
EOS (ABSOLUTE): 0.2 x10E3/uL (ref 0.0–0.4)
Eos: 4 %
Hematocrit: 43.8 % (ref 34.0–46.6)
Hemoglobin: 14.7 g/dL (ref 11.1–15.9)
Immature Grans (Abs): 0 x10E3/uL (ref 0.0–0.1)
Immature Granulocytes: 0 %
Lymphocytes Absolute: 1.7 x10E3/uL (ref 0.7–3.1)
Lymphs: 32 %
MCH: 30.7 pg (ref 26.6–33.0)
MCHC: 33.6 g/dL (ref 31.5–35.7)
MCV: 91 fL (ref 79–97)
Monocytes Absolute: 0.4 x10E3/uL (ref 0.1–0.9)
Monocytes: 8 %
Neutrophils Absolute: 2.8 x10E3/uL (ref 1.4–7.0)
Neutrophils: 55 %
Platelets: 286 x10E3/uL (ref 150–450)
RBC: 4.79 x10E6/uL (ref 3.77–5.28)
RDW: 13.1 % (ref 11.7–15.4)
WBC: 5.2 x10E3/uL (ref 3.4–10.8)

## 2024-12-14 LAB — TISSUE TRANSGLUTAMINASE, IGA: t-Transglutaminase (tTG) IgA: <2 U/mL (ref 0–3)

## 2024-12-14 LAB — LIPASE: Lipase: 26 U/L (ref 14–72)

## 2024-12-17 ENCOUNTER — Ambulatory Visit: Payer: Self-pay | Admitting: Gastroenterology

## 2024-12-23 ENCOUNTER — Encounter (INDEPENDENT_AMBULATORY_CARE_PROVIDER_SITE_OTHER): Payer: Self-pay

## 2024-12-24 ENCOUNTER — Telehealth: Payer: Self-pay | Admitting: *Deleted

## 2024-12-24 NOTE — Telephone Encounter (Signed)
 Called patient and again no answer and mailbox is full however a Mychart message was sent 2/1 about her procedure.

## 2024-12-25 ENCOUNTER — Telehealth: Payer: Self-pay | Admitting: *Deleted

## 2024-12-25 NOTE — Telephone Encounter (Signed)
 Spoke with pt and she rescheduled

## 2025-01-11 ENCOUNTER — Other Ambulatory Visit (HOSPITAL_COMMUNITY): Payer: Medicare (Managed Care)

## 2025-01-15 ENCOUNTER — Ambulatory Visit: Admitting: Dermatology

## 2025-01-15 ENCOUNTER — Encounter (HOSPITAL_COMMUNITY): Admission: RE | Payer: Self-pay | Source: Home / Self Care

## 2025-01-15 ENCOUNTER — Ambulatory Visit (HOSPITAL_COMMUNITY)
Admission: RE | Admit: 2025-01-15 | Payer: Medicare (Managed Care) | Source: Home / Self Care | Admitting: Internal Medicine

## 2025-02-01 ENCOUNTER — Ambulatory Visit

## 2025-02-15 ENCOUNTER — Ambulatory Visit: Admitting: Obstetrics
